# Patient Record
Sex: Female | Born: 1935 | Race: Black or African American | Hispanic: No | Smoking: Never smoker
Health system: Southern US, Community
[De-identification: ages and names within clinical notes are randomized; demographics above are authoritative.]

## PROBLEM LIST (undated history)

## (undated) DIAGNOSIS — F0390 Unspecified dementia without behavioral disturbance: Secondary | ICD-10-CM

## (undated) DIAGNOSIS — R634 Abnormal weight loss: Secondary | ICD-10-CM

## (undated) DIAGNOSIS — C541 Malignant neoplasm of endometrium: Secondary | ICD-10-CM

## (undated) DIAGNOSIS — R600 Localized edema: Secondary | ICD-10-CM

## (undated) DIAGNOSIS — N904 Leukoplakia of vulva: Secondary | ICD-10-CM

## (undated) DIAGNOSIS — M436 Torticollis: Secondary | ICD-10-CM

## (undated) DIAGNOSIS — N3091 Cystitis, unspecified with hematuria: Secondary | ICD-10-CM

## (undated) DIAGNOSIS — R5383 Other fatigue: Secondary | ICD-10-CM

## (undated) DIAGNOSIS — R51 Headache: Secondary | ICD-10-CM

## (undated) DIAGNOSIS — R519 Other chronic pain: Secondary | ICD-10-CM

## (undated) DIAGNOSIS — I214 Non-ST elevation (NSTEMI) myocardial infarction: Secondary | ICD-10-CM

## (undated) DIAGNOSIS — R319 Hematuria, unspecified: Secondary | ICD-10-CM

## (undated) DIAGNOSIS — N6320 Unspecified lump in the left breast, unspecified quadrant: Secondary | ICD-10-CM

## (undated) DIAGNOSIS — R5381 Other malaise: Secondary | ICD-10-CM

## (undated) DIAGNOSIS — B3731 Acute candidiasis of vulva and vagina: Secondary | ICD-10-CM

## (undated) DIAGNOSIS — B373 Candidiasis of vulva and vagina: Secondary | ICD-10-CM

## (undated) DIAGNOSIS — J45909 Unspecified asthma, uncomplicated: Secondary | ICD-10-CM

## (undated) DIAGNOSIS — G8929 Other chronic pain: Secondary | ICD-10-CM

## (undated) DIAGNOSIS — M549 Dorsalgia, unspecified: Secondary | ICD-10-CM

## (undated) DIAGNOSIS — N390 Urinary tract infection, site not specified: Secondary | ICD-10-CM

## (undated) DIAGNOSIS — I10 Essential (primary) hypertension: Secondary | ICD-10-CM

## (undated) DIAGNOSIS — G4730 Sleep apnea, unspecified: Secondary | ICD-10-CM

## (undated) DIAGNOSIS — I2510 Atherosclerotic heart disease of native coronary artery without angina pectoris: Secondary | ICD-10-CM

## (undated) DIAGNOSIS — T783XXA Angioneurotic edema, initial encounter: Secondary | ICD-10-CM

## (undated) HISTORY — DX: Other malaise: R53.81

## (undated) HISTORY — DX: Unspecified lump in the left breast, unspecified quadrant: N63.20

## (undated) HISTORY — DX: Sleep apnea, unspecified: G47.30

## (undated) HISTORY — DX: Cystitis, unspecified with hematuria: N30.91

## (undated) HISTORY — DX: Localized edema: R60.0

## (undated) HISTORY — DX: Leukoplakia of vulva: N90.4

## (undated) HISTORY — PX: CORONARY ANGIOPLASTY WITH STENT PLACEMENT: SHX49

## (undated) HISTORY — DX: Other chronic pain: G89.29

## (undated) HISTORY — DX: Candidiasis of vulva and vagina: B37.3

## (undated) HISTORY — DX: Angioneurotic edema, initial encounter: T78.3XXA

## (undated) HISTORY — DX: Acute candidiasis of vulva and vagina: B37.31

## (undated) HISTORY — DX: Headache, unspecified: R51.9

## (undated) HISTORY — DX: Urinary tract infection, site not specified: N39.0

## (undated) HISTORY — DX: Hematuria, unspecified: R31.9

## (undated) HISTORY — DX: Dorsalgia, unspecified: M54.9

## (undated) HISTORY — DX: Abnormal weight loss: R63.4

## (undated) HISTORY — PX: ABDOMINAL HYSTERECTOMY: SHX81

## (undated) HISTORY — DX: Malignant neoplasm of endometrium: C54.1

## (undated) HISTORY — PX: OTHER SURGICAL HISTORY: SHX169

## (undated) HISTORY — DX: Headache: R51

## (undated) HISTORY — DX: Torticollis: M43.6

## (undated) HISTORY — DX: Non-ST elevation (NSTEMI) myocardial infarction: I21.4

## (undated) HISTORY — DX: Atherosclerotic heart disease of native coronary artery without angina pectoris: I25.10

## (undated) HISTORY — DX: Other fatigue: R53.83

---

## 2004-09-10 ENCOUNTER — Emergency Department: Payer: Self-pay | Admitting: Internal Medicine

## 2004-09-10 ENCOUNTER — Other Ambulatory Visit: Payer: Self-pay

## 2004-11-09 ENCOUNTER — Emergency Department: Payer: Self-pay | Admitting: General Practice

## 2004-12-23 ENCOUNTER — Ambulatory Visit: Payer: Self-pay | Admitting: Family Medicine

## 2006-08-01 ENCOUNTER — Emergency Department (HOSPITAL_COMMUNITY): Admission: EM | Admit: 2006-08-01 | Discharge: 2006-08-02 | Payer: Self-pay | Admitting: Emergency Medicine

## 2009-03-27 ENCOUNTER — Ambulatory Visit: Payer: Self-pay | Admitting: Family Medicine

## 2009-04-29 ENCOUNTER — Emergency Department: Payer: Self-pay | Admitting: Emergency Medicine

## 2009-10-02 ENCOUNTER — Emergency Department: Payer: Self-pay | Admitting: Emergency Medicine

## 2009-12-04 ENCOUNTER — Emergency Department: Payer: Self-pay | Admitting: Emergency Medicine

## 2010-01-15 ENCOUNTER — Emergency Department: Payer: Self-pay | Admitting: Unknown Physician Specialty

## 2010-05-13 ENCOUNTER — Observation Stay: Payer: Self-pay | Admitting: Internal Medicine

## 2010-06-22 ENCOUNTER — Emergency Department: Payer: Self-pay | Admitting: Emergency Medicine

## 2010-11-15 ENCOUNTER — Emergency Department: Payer: Self-pay | Admitting: *Deleted

## 2011-01-13 ENCOUNTER — Emergency Department: Payer: Self-pay | Admitting: Internal Medicine

## 2011-02-02 ENCOUNTER — Emergency Department: Payer: Self-pay | Admitting: Emergency Medicine

## 2011-02-13 ENCOUNTER — Emergency Department: Payer: Self-pay | Admitting: *Deleted

## 2011-03-13 ENCOUNTER — Inpatient Hospital Stay: Payer: Self-pay | Admitting: Internal Medicine

## 2011-09-02 ENCOUNTER — Observation Stay: Payer: Self-pay | Admitting: Student

## 2011-09-02 LAB — CBC
HCT: 40.2 % (ref 35.0–47.0)
MCHC: 32.4 g/dL (ref 32.0–36.0)
MCV: 95 fL (ref 80–100)
Platelet: 166 10*3/uL (ref 150–440)
WBC: 4.9 10*3/uL (ref 3.6–11.0)

## 2011-09-02 LAB — CK TOTAL AND CKMB (NOT AT ARMC): CK, Total: 60 U/L (ref 21–215)

## 2011-09-02 LAB — COMPREHENSIVE METABOLIC PANEL
Albumin: 3.7 g/dL (ref 3.4–5.0)
BUN: 20 mg/dL — ABNORMAL HIGH (ref 7–18)
Bilirubin,Total: 0.4 mg/dL (ref 0.2–1.0)
Chloride: 105 mmol/L (ref 98–107)
Co2: 31 mmol/L (ref 21–32)
EGFR (African American): 57 — ABNORMAL LOW
Glucose: 101 mg/dL — ABNORMAL HIGH (ref 65–99)
SGOT(AST): 18 U/L (ref 15–37)
SGPT (ALT): 14 U/L
Sodium: 140 mmol/L (ref 136–145)
Total Protein: 8.2 g/dL (ref 6.4–8.2)

## 2011-09-02 LAB — TROPONIN I: Troponin-I: <0.02 ng/mL

## 2011-09-02 LAB — PRO B NATRIURETIC PEPTIDE: B-Type Natriuretic Peptide: 31 pg/mL (ref 0–450)

## 2011-09-03 LAB — LIPID PANEL
Cholesterol: 134 mg/dL (ref 0–200)
HDL Cholesterol: 49 mg/dL (ref 40–60)
Ldl Cholesterol, Calc: 73 mg/dL (ref 0–100)
VLDL Cholesterol, Calc: 12 mg/dL (ref 5–40)

## 2011-09-03 LAB — HEMOGLOBIN A1C: Hemoglobin A1C: 6.3 % (ref 4.2–6.3)

## 2011-09-14 ENCOUNTER — Emergency Department: Payer: Self-pay | Admitting: Unknown Physician Specialty

## 2011-09-14 LAB — BASIC METABOLIC PANEL
Anion Gap: 9 (ref 7–16)
BUN: 20 mg/dL — ABNORMAL HIGH (ref 7–18)
Co2: 23 mmol/L (ref 21–32)
Creatinine: 0.75 mg/dL (ref 0.60–1.30)
EGFR (Non-African Amer.): >60
Glucose: 78 mg/dL (ref 65–99)

## 2011-09-14 LAB — CBC
HGB: 11.5 g/dL — ABNORMAL LOW (ref 12.0–16.0)
MCV: 94 fL (ref 80–100)
Platelet: 166 10*3/uL (ref 150–440)
RDW: 14.4 % (ref 11.5–14.5)
WBC: 5 10*3/uL (ref 3.6–11.0)

## 2011-09-14 LAB — CK TOTAL AND CKMB (NOT AT ARMC): CK, Total: 48 U/L (ref 21–215)

## 2012-03-08 ENCOUNTER — Emergency Department: Payer: Self-pay | Admitting: Unknown Physician Specialty

## 2012-03-08 LAB — COMPREHENSIVE METABOLIC PANEL
Anion Gap: 5 — ABNORMAL LOW (ref 7–16)
BUN: 25 mg/dL — ABNORMAL HIGH (ref 7–18)
Bilirubin,Total: 0.4 mg/dL (ref 0.2–1.0)
Chloride: 106 mmol/L (ref 98–107)
Co2: 26 mmol/L (ref 21–32)
Creatinine: 0.87 mg/dL (ref 0.60–1.30)
EGFR (African American): >60
EGFR (Non-African Amer.): 59 — ABNORMAL LOW
Potassium: 3.7 mmol/L (ref 3.5–5.1)
SGOT(AST): 31 U/L (ref 15–37)
SGPT (ALT): 16 U/L (ref 12–78)
Total Protein: 7.8 g/dL (ref 6.4–8.2)

## 2012-03-08 LAB — CBC
HCT: 37.3 % (ref 35.0–47.0)
MCH: 31.1 pg (ref 26.0–34.0)
MCV: 93 fL (ref 80–100)
Platelet: 205 10*3/uL (ref 150–440)
RBC: 4.03 10*6/uL (ref 3.80–5.20)
RDW: 14.4 % (ref 11.5–14.5)
WBC: 6.4 10*3/uL (ref 3.6–11.0)

## 2012-03-08 LAB — TROPONIN I: Troponin-I: <0.02 ng/mL

## 2012-08-19 ENCOUNTER — Emergency Department: Payer: Self-pay | Admitting: Emergency Medicine

## 2012-08-19 LAB — CBC
HCT: 37.9 % (ref 35.0–47.0)
MCHC: 32.7 g/dL (ref 32.0–36.0)
MCV: 93 fL (ref 80–100)
Platelet: 180 10*3/uL (ref 150–440)
RBC: 4.07 10*6/uL (ref 3.80–5.20)
RDW: 14.5 % (ref 11.5–14.5)
WBC: 5 10*3/uL (ref 3.6–11.0)

## 2012-08-19 LAB — COMPREHENSIVE METABOLIC PANEL
Albumin: 3.4 g/dL (ref 3.4–5.0)
Alkaline Phosphatase: 41 U/L — ABNORMAL LOW (ref 50–136)
Creatinine: 0.74 mg/dL (ref 0.60–1.30)
EGFR (African American): >60
EGFR (Non-African Amer.): >60
Glucose: 87 mg/dL (ref 65–99)
Osmolality: 280 (ref 275–301)
SGOT(AST): 16 U/L (ref 15–37)
SGPT (ALT): 11 U/L — ABNORMAL LOW (ref 12–78)
Sodium: 141 mmol/L (ref 136–145)

## 2012-08-20 LAB — TROPONIN I: Troponin-I: <0.02 ng/mL

## 2012-08-20 LAB — PRO B NATRIURETIC PEPTIDE: B-Type Natriuretic Peptide: 43 pg/mL (ref 0–450)

## 2013-05-08 ENCOUNTER — Observation Stay: Payer: Self-pay | Admitting: Internal Medicine

## 2013-05-08 LAB — CBC
HCT: 37.9 % (ref 35.0–47.0)
HGB: 12.4 g/dL (ref 12.0–16.0)
MCH: 30.4 pg (ref 26.0–34.0)
MCHC: 32.7 g/dL (ref 32.0–36.0)
MCV: 93 fL (ref 80–100)
Platelet: 166 10*3/uL (ref 150–440)
RBC: 4.08 10*6/uL (ref 3.80–5.20)
RDW: 15.1 % — AB (ref 11.5–14.5)
WBC: 4.8 10*3/uL (ref 3.6–11.0)

## 2013-05-08 LAB — BASIC METABOLIC PANEL
Anion Gap: 3 — ABNORMAL LOW (ref 7–16)
BUN: 15 mg/dL (ref 7–18)
CALCIUM: 10.2 mg/dL — AB (ref 8.5–10.1)
CHLORIDE: 105 mmol/L (ref 98–107)
CREATININE: 0.95 mg/dL (ref 0.60–1.30)
Co2: 30 mmol/L (ref 21–32)
EGFR (Non-African Amer.): 53 — ABNORMAL LOW
Glucose: 102 mg/dL — ABNORMAL HIGH (ref 65–99)
OSMOLALITY: 277 (ref 275–301)
Potassium: 3.5 mmol/L (ref 3.5–5.1)
SODIUM: 138 mmol/L (ref 136–145)

## 2013-05-08 LAB — CK TOTAL AND CKMB (NOT AT ARMC)
CK, Total: 46 U/L (ref 21–215)
CK, Total: 45 U/L (ref 21–215)
CK, Total: 71 U/L (ref 21–215)
CK-MB: 0.7 ng/mL (ref 0.5–3.6)
CK-MB: 0.6 ng/mL (ref 0.5–3.6)
CK-MB: 1.2 ng/mL (ref 0.5–3.6)

## 2013-05-08 LAB — TROPONIN I
Troponin-I: <0.02 ng/mL
Troponin-I: <0.02 ng/mL

## 2013-05-08 LAB — PRO B NATRIURETIC PEPTIDE: B-Type Natriuretic Peptide: 57 pg/mL (ref 0–450)

## 2014-08-16 NOTE — Discharge Summary (Signed)
PATIENT NAME:  Sara Heath, Sara Heath MR#:  [id number] DATE OF BIRTH:  July 22, 1921  DATE OF ADMISSION:  05/08/2013 DATE OF DISCHARGE:  05/08/2013  ADMISSION DIAGNOSIS:  1.  Chest pain of unclear etiology.  2.  History of coronary artery disease.   CONSULTATIONS:  Dr. Ubaldo Glassing.   IMAGING: The patient had a 2-D echocardiogram which showed normal ejection fraction with mild aortic valve sclerosis, no stenosis.   LABORATORY DATA:  Troponins were negative.   HOSPITAL COURSE: This is a very pleasant 81 year old female who presented with chest pain. For further details, please refer to the H and P.  1.  Chest pain. The patient was admitted to the hospitalist service. She was admitted on telemetry. Cardiac enzymes were checked, both of which were negative. She underwent an echocardiogram, essentially this was normal with no mention of any wall motion abnormalities. We did not feel her chest pain is cardiac in nature. At this time, Dr. Ubaldo Glassing has seen the patient in consultation.  2.  History of CAD. The patient will continue her outpatient medications.  3.  History of gout, not on any medications.  4.  History of hyperlipidemia. We did start the patient on a statin as the patient does have a history of CAD.   5.  History of hypertension. The patient's blood pressures were elevated here so we did start her on lisinopril in addition to her Imdur.   DISCHARGE MEDICATIONS: 1.  Lasix 40 mg daily.  2.  Aspirin 81 mg daily.  3.  Atenolol 50 mg daily.  4.  Docusate 100 mg b.i.d. p.r.n.   5.  Imdur 60 mg in the morning.  5.  Cyclobenzaprine 5 mg every 8 hours as needed.  6.  Lisinopril 10 mg daily, which is a new medication due to her blood pressure.  7.  Simvastatin 10 mg daily.  8.  Nitroglycerin sublingual p.r.n. chest pain.   DISCHARGE DIET: Low sodium diet.   DISCHARGE ACTIVITY: As tolerated.   DISCHARGE FOLLOWUP: The patient will follow up with Dr. Ubaldo Glassing in 2 weeks as well as Dr. Shade Flood.  The patient is  medically stable for discharge.   TIME SPENT:  35 minutes. Plan of care was discussed with the family.   ____________________________ Donell Beers. Benjie Karvonen, MD spm:cs D: 05/08/2013 14:06:00 ET T: 05/08/2013 14:56:04 ET JOB#: [id number]  cc: Jaramiah Bossard P. Benjie Karvonen, MD, <Dictator> Sena Hitch, MD Javier Docker. Ubaldo Glassing, MD Donell Beers Yuliya Nova MD ELECTRONICALLY SIGNED 05/08/2013 21:27

## 2014-08-16 NOTE — Consult Note (Signed)
Present Illness The patient is an 81 year old African American female with a past medical history of coronary artery disease status post stent placement in the past, hyperlipidemia, gout, depression, hypertension who presented to the er per her daughter with 1-2 days history of chest pain and sob. Pt is a somwhat difficult historian but answers questions appropriately. She states she has no chest pain at present. She states she has seen a cardiologist in the past but does not know who it was or where the care was delivered. She has ruled out for an mi thus far. She deneis chest pain or shortness of breath. She states her ankles stay somewhat swollen much of the time.   Physical Exam:  GEN no acute distress   HEENT hearing intact to voice   NECK supple  No masses   RESP normal resp effort  rhonchi   CARD Regular rate and rhythm  Murmur   Murmur Systolic   Systolic Murmur Out flow   ABD denies tenderness  normal BS   EXTR positive edema, lower extremety edema   SKIN normal to palpation   NEURO cranial nerves intact, motor/sensory function intact   PSYCH poor insight   Review of Systems:  Subjective/Chief Complaint admitted with chest pain and sob. no symptoms at present   General: Fatigue  Weakness   Skin: No Complaints   ENT: No Complaints   Eyes: No Complaints   Neck: No Complaints   Respiratory: Short of breath   Cardiovascular: Chest pain or discomfort   Gastrointestinal: No Complaints   Genitourinary: No Complaints   Vascular: No Complaints   Musculoskeletal: No Complaints   Neurologic: No Complaints   Hematologic: No Complaints   Endocrine: No Complaints   Psychiatric: No Complaints   Review of Systems: All other systems were reviewed and found to be negative   ROS responds approriately but somewhat difficult historian   Medications/Allergies Reviewed Medications/Allergies reviewed   Home Medications: Medication Instructions Status   furosemide 40 mg oral tablet 1 tab(s) orally once a day Active  aspirin 81 mg oral delayed release tablet 1 tab(s) orally once a day Active  atenolol 50 mg oral tablet 1 tab(s) orally once a day for high blood pressure Active  docusate sodium 100 mg oral capsule 1 cap(s) orally 2 times a day, As Needed Active  isosorbide mononitrate 60 mg oral tablet, extended release 1 tab(s) orally once a day (in the morning) Active  cyclobenzaprine 5 mg oral tablet 1 tab(s) orally every 8 hours, As Needed and 1 tab at bedtime  Active   EKG:  EKG NSR   Abnormal NSSTTW changes   Interpretation 1st degree av block. no injury of ischemia   EKG Comparision Not changed from  previous ekg    Penicillin: Unknown  Cipro: Unknown   Impression 81 yo female with apparent history of cad s/p pci, history of hypertension who was admitted with complaints of chest pain. Has ruled out for mi thus far and ekg is unremarkable for ischemia or injury. CXR revealed stable cardiomegaly with no airspace disease or ppulmonary edema. Etiology of chest pain is unclear. May have been transient ischemia vs non ischemic pain. Will evaluate lv function and wall motion with echo. Continue with current meds including atorvastatin, metoprolol, asa and topical nitrates. Will defer further invasive or noninvasive evaluation pending echo, symptoms and further clinical data. Will attempt to treat medically given comorbid conditions.   Plan 1. Continue with asa, topical nitrates, atorvastatin, and metoprolol  2. DVT prophylaxis enoxaparin 3. Echo to evaluate lv function and valves 4. Continiue to rule out for mi   Electronic Signatures: Teodoro Spray (MD)  (Signed 14-Jan-15 09:50)  Authored: General Aspect/Present Illness, History and Physical Exam, Review of System, Home Medications, EKG , Allergies, Impression/Plan   Last Updated: 14-Jan-15 09:50 by Teodoro Spray (MD)

## 2014-08-16 NOTE — H&P (Signed)
PATIENT NAME:  Sara Heath, Sara Heath MR#:  [id number] DATE OF BIRTH:  1921-06-07  DATE OF ADMISSION:  05/08/2013  PRIMARY CARE PHYSICIAN: Dr. Sena Hitch.   REFERRING PHYSICIAN: Dr. Owens Shark.   CHIEF COMPLAINT: Chest pain.   HISTORY OF PRESENT ILLNESS: The patient is an 81 year old African American female with a past medical history of coronary artery disease status post stent placement in the past, hyperlipidemia, gout, depression, hypertension and depression, is presenting to the ER with a chief complaint of chest pain. The patient reported that yesterday evening she started having chest tightness in the middle of the chest with no radiation. Associated with shortness of breath, but denies any dizziness, nausea or vomiting. The chest tightness is progressively getting worse. Her daughter brought her into the ER. The patient was chest pain-free by the time she came into the ER. She has history of coronary artery disease and stents were placed in the past. Initial set of cardiac enzymes are negative; however, because of her significant risk factors and considering her age the hospitalist team is called to admit the patient to rule out acute myocardial infarction. During my examination, no family members are at bedside and the patient is chest pain-free. She is hard of hearing, but answering questions appropriately.   PAST MEDICAL HISTORY: Coronary artery disease status post stent, hyperlipidemia, GERD, depression, hypertension.   PAST SURGICAL HISTORY: Status post stenting of coronary artery disease.  ALLERGIES:  PENICILLIN.   PSYCHOSOCIAL HISTORY: Lives at home, lives with her daughter. No history of smoking, alcohol or illicit drug usage.   FAMILY HISTORY: Positive for hypertension.   HOME MEDICATIONS: Isosorbide mononitrate, dose unknown p.o. once daily, furosemide 40 mg once a day, Colace 100 mg p.o. q.12 hours, atenolol 50 mg p.o. once a day, aspirin 81 mg once daily.    REVIEW OF  SYSTEMS: CONSTITUTIONAL: Denies fever, fatigue.  EYES: Denies blurry vision, double vision.  ENT: Denies epistaxis, discharge.  RESPIRATION: Denies cough, COPD.  CARDIOVASCULAR: Complaining of chest pain, which has resolved by the time the patient came into the ER. Denies syncope.  GASTROINTESTINAL: No nausea, vomiting, diarrhea.  GENITOURINARY: No dysuria or hematuria. GYNECOLOGICAL AND BREAST:  Denies breast mass or vaginal discharge.  ENDOCRINE: Denies polyuria or nocturia.  HEMATOLOGIC AND LYMPHATIC: No anemia, easy bruising, bleeding.  INTEGUMENTARY: No acne, rash, lesions.  MUSCULOSKELETAL: No joint pain in neck and back. Has history of gout.  NEUROLOGICAL: No vertigo or ataxia.  PSYCHIATRIC: No ADD, OCD.   PHYSICAL EXAMINATION:  VITAL SIGNS: Temperature 98.2, pulse 51, respirations 18, blood pressure 115/66, pulse oximetry 99% on 2 liters.  GENERAL APPEARANCE: Not in acute distress. Moderately built and thin-looking African American female in no acute distress.  HEENT: Normocephalic, atraumatic. Pupils are equal, reacting to light and accommodation. No scleral icterus. No conjunctival injection. Moist mucous membranes.  NECK: Supple. No JVD. No thyromegaly.  LUNGS: Clear to auscultation bilaterally. No accessory muscle use and no anterior chest wall tenderness on palpation. No peripheral edema.  CARDIAC: S1, S2 normal. Regular rate and rhythm. No murmurs.  GASTROINTESTINAL: Soft. Bowel sounds are positive in all four quadrants. Nontender, nondistended. No hepatosplenomegaly. No masses felt.  NEUROLOGIC: Awake, alert, oriented x 3. Hard of hearing, but answering questions appropriately. Motor and sensory are grossly intact. Reflexes are 2+.  EXTREMITIES: No edema. No cyanosis. No clubbing.  SKIN: Warm to touch. Normal turgor. No rashes. No lesions.   MUSCULOSKELETAL: No joint effusion, tenderness or erythema.   LABS AND IMAGING STUDIES:  Troponin less than 0.02. CBC is normal.  Chem-8:  Glucose is 102. Anion gap 3. The rest of the labs are normal.  A 12-lead EKG: Sinus bradycardia with first-degree AV block, prolonged PR interval at 280 ms, normal QRS. No acute ST-T wave changes. Chest x-ray, portable: stable cardiomegaly and left lung base atelectasis.   ASSESSMENT AND PLAN: An 81 year old African American female presenting to the ER with a chief complaint of chest tightness associated with shortness of breath started yesterday, resolved done by the time the patient arrived to the ER will be admitted with following assessment and plan.  1. Chest pain rule out acute myocardial infarction. Admit her to telemetry bed. Acute coronary syndrome protocol with oxygen, nitroglycerin, aspirin, beta blocker and statin. Cycle cardiac biomarkers. Cardiology consult is placed to Dr. Ubaldo Glassing.  2. Hypertension. Resume her home medication and uptitrate as needed basis.  3. History of coronary artery disease status post stent.  4. Hyperlipidemia. The patient will be on statin.  5. Gout. No exacerbation.   The patient will be receiving GI and DVT prophylaxis.   The patient will be considered FULL CODE until CODE STATUS is determined by discussion with daughter. The patient could not determine her CODE STATUS at time. Diagnosis and plan of care was discussed in detail with the patient. She is aware of the plan.   Total time spent on admission is 45 minutes.   ____________________________ Nicholes Mango, MD ag:sg D: 05/08/2013 07:51:42 ET T: 05/08/2013 08:03:37 ET JOB#: [id number]  cc: Nicholes Mango, MD, <Dictator> Sena Hitch, MD  Nicholes Mango MD ELECTRONICALLY SIGNED 05/19/2013 7:12

## 2014-08-17 NOTE — Discharge Summary (Signed)
PATIENT NAME:  Sara Heath, Sara Heath MR#:  [id number] DATE OF BIRTH:  [birth year]  DATE OF ADMISSION:  09/02/2011 DATE OF DISCHARGE:  09/03/2011  CHIEF COMPLAINT: Chest pain.   DISCHARGE DIAGNOSES:  1. Chest pain likely not cardiac.  2. Hypertension.  3. History of coronary artery disease.   SECONDARY DIAGNOSES:  1. Status post myocardial infarction and stenting, in the past.  2. Gout.  3. Hyperlipidemia.  4. Depression.   DISCHARGE MEDICATIONS:  Imdur 30 mg p.o. daily. Aspirin 81 mg daily. Atenolol 50 mg daily. Simvastatin 20 mg daily. Loratadine 10 mg daily. Indomethacin 50 mg 3 times a day. Tramadol 50 mg 4 times a day as needed. Lasix 20 mg daily.   DIET: Mechanical soft.   ACTIVITY: As tolerated.   FOLLOWUP: Please follow up with her primary care physician for further work-up in 1 to 2 days and if she has further chest pains, please call your doctor right away.  HISTORY OF PRESENT ILLNESS: For full details, please see the history and physical dictated on 09/02/2011, but briefly this is an 81 year old female with history of coronary artery disease, status post myocardial infarction in the past, hyperlipidemia, hypertension who was referred here from [facility] after complaining of chest pain. The chest pain has lasted about 2 to 3 minutes and by the time she had gone to her doctor's office,  the EKG machine is broken and she was referred here. The chest pain occurred early in the morning and was  relieved with 2 nitroglycerin sublinguals. She was admitted to hospitalist service for observation and ruling out MI.   SIGNIFICANT LABS/IMAGING: BUN on arrival was 20, creatinine 1.02, BNP 31, glucose 101, LDL 73, triglycerides 59, HDL 49. LFTs: Alkaline phosphatase 39, otherwise within normal limits. Troponins negative x3. CK-MB negative x3. WBC on arrival 4.9, hemoglobin 13, hematocrit 40.2. X-ray of the chest:  Enlarged tortuous thoracic aorta similar to prior. No acute cardiopulmonary  disease. Stress test, nuclear medicine. Although this is not officially read at a computer per Dr. Nehemiah Massed who read the stress test, it was  negative for ischemia and the patient is dischargeable.   HOSPITAL COURSE: The patient was admitted to the hospitalist service and cyclic cardiac markers were obtained. She had no acute changes on the EKG and negative troponin x3. She had no further episodes of chest pain. Initially she was started on nitro paste and this was changed to Imdur. She had no further chest pains while here. Her LDL came back well controlled for her and she had stable blood pressure here. At this point, as she has had no further symptoms she would be discharged with outpatient follow-up. In addition she was prescribed Imdur. She is to continue taking her beta blocker, Lasix and aspirin.   Total Time Spent: 35 minutes.   CODE STATUS: THE PATIENT IS FULL CODE    ____________________________ Vivien Presto, MD sa:ljs D: 09/03/2011 16:19:35 ET T: 09/06/2011 10:38:20 ET JOB#: [id number]  cc: Vivien Presto, MD, <Dictator> Vivien Presto MD ELECTRONICALLY SIGNED 09/09/2011 16:30

## 2014-08-17 NOTE — H&P (Signed)
PATIENT NAME:  Sara Heath, SHAMOON MR#:  [id number] DATE OF BIRTH:  [birth year]  DATE OF ADMISSION:  09/02/2011  REFERRING PHYSICIAN:  Dr Ulice Brilliant.   PRIMARY CARE PHYSICIAN:  At [facility].    CHIEF COMPLAINT: Chest pain.   HISTORY OF PRESENT ILLNESS: The patient is an 81 year old African American female with history of hypertension, coronary artery disease, status post stenting and myocardial infarction, hyperlipidemia, gout who presents with above complaint. The patient is accompanied by her daughter, who provides history as well. The patient stated that chest pain episode woke her up from sleep, lasted about 2 to 3 minutes with shortness of breath and radiation in the right arm. She took a nitroglycerin and then another with improvement. She went to her doctor's office and as they could not do an EKG I referred her here. She has no further chest pains. There are no other associated symptoms. Per daughter she has been having several doses of nitroglycerin more recently. Hospitalist service was contacted for further evaluation and management. The first troponin is negative. Furthermore, the patient has had a negative stress test in 04/2010 showing no evidence of myocardial ischemia.   PAST MEDICAL HISTORY:  1. Hypertension.  2. Coronary artery disease, status post myocardial infarction in 2002, status post stenting.  3. Hyperlipidemia.  4. Gout.  5. Depression.   ALLERGIES: Penicillin and Cipro.      MEDICATIONS:  1. Lasix 40 mg daily.  2. Atenolol 50 mg daily.  3. Simvastatin 20 mg daily.  4. Nitroglycerin sublingual p.r.n.  5. Zyrtec 10 mg p.r.n.  6. Docusate sodium p.r.n.  7. Colchicine 0.6 mg take 2 tabs p.r.n. for gout.  8. Aspirin 81 mg daily.   FAMILY HISTORY: Daughter with hypertension, diabetes. Sister with breast cancer.   SOCIAL HISTORY: No tobacco, alcohol or drug use. Lives with her daughter.    REVIEW OF SYSTEMS: CONSTITUTIONAL: Denies fever or fatigue, weakness, or  weight changes. EYES: No blurry vision or double vision. ENT: No tinnitus or hearing loss. RESPIRATORY: No shortness of breath currently, however did have episode of shortness of breath associated with the chest pain. Has a chronic cough. CARDIOVASCULAR: Chest pain as above. No orthopnea. Has occasional lower extremity edema. Has chronic dyspnea on exertion.  GASTROINTESTINAL: No nausea, vomiting, diarrhea, abdominal pain, hematemesis. GENITOURINARY: Denies dysuria, hematuria, or incontinence. ENDOCRINE: Denies polyuria, nocturia, or thyroid problems. HEME/LYMPH: Denies anemia or easy bruising. SKIN: Denies any new rashes. MUSCULOSKELETAL: Has chronic arthritis.  NEUROLOGIC: Denies numbness, weakness, a transient ischemic attack or CVA. PSYCHIATRIC: Denies anxiety or insomnia. Has depression.   PHYSICAL EXAMINATION:  VITAL SIGNS: Temperature on arrival 96.3, pulse rate 64, respiratory rate 13, blood pressure 135/79, O2 saturations 100% on room air.   GENERAL: The patient is an elderly African American female lying in bed, no obvious distress.   HEENT: Normocephalic, atraumatic. Pupils are equal and reactive. Anicteric sclerae. Has no teeth and no dentures.   NECK: Supple. No thyroid tenderness.   CARDIOVASCULAR: S1, S2 regular rate and rhythm. No murmurs, rubs, or gallops.   LUNGS: Clear to auscultation without wheezing or rhonchi.   ABDOMEN: Soft, nontender, no organomegaly noted.   EXTREMITIES: No significant lower extremity edema.   NEUROLOGICAL: Cranial nerves II through XII grossly intact. Strength is 5 out of 5 all extremities.   LABORATORIES: Glucose 101, BUN 20, creatinine is 1.02, sodium 140, potassium 3.7. LFTs: Alkaline phosphatase is 39, otherwise within normal limits. Troponin negative. CK-MB 1. WBC 4.9, hemoglobin 13, hematocrit  40.2, platelets 166,000. EKG shows normal sinus rhythm, minimum voltage for left ventricular hypertrophy. There are Q waves in V1, V2, V3 as well as lead  III, does not appear to be any acute ST elevations or depressions. Portable chest, pain and large tortuous thoracic aorta similar to prior, otherwise no acute cardiopulmonary disease.   ASSESSMENT AND PLAN: We have an 81 year old African American female with history of cerebrovascular accident status post stenting, hypertension, depression, myocardial infarction who presents with chest pain resolved with nitroglycerin. The patient has also had several other episodes of chest pain which improved with nitroglycerin recently. Although the patient has a negative troponin and no acute EKG changes currently, we would admit the patient for observation and rule out acute coronary syndrome. I would check cyclic cardiac markers and if negative we will order an echocardiogram for tomorrow. I would continue the aspirin, beta-blocker, and statin. I will check a lipid profile as well. I would apply nitroglycerin paste as well. She has no chest pains currently. The patient's last stress test was in 2010 which was negative. Would also place her on remote telemetry.   In regards to the hypertension, would continue outpatient medicines. I would resume her statin as well. We will start the patient on heparin for deep vein thrombosis prophylaxis.   TOTAL TIME SPENT: 35 minutes.   CODE STATUS: THE PATIENT IS FULL CODE CURRENTLY.    ____________________________ Vivien Presto, MD sa:vtd D: 09/02/2011 17:54:00 ET T: 09/03/2011 07:16:22 ET JOB#: [id number]  cc: Trish Fountain [facility] Vivien Presto, MD, <Dictator> [facility] MD ELECTRONICALLY SIGNED 09/09/2011 16:30

## 2014-10-02 ENCOUNTER — Emergency Department
Admission: EM | Admit: 2014-10-02 | Discharge: 2014-10-02 | Disposition: A | Discharge status: Home / Self Care | Payer: Medicare Other | Attending: Emergency Medicine | Admitting: Emergency Medicine

## 2014-10-02 ENCOUNTER — Emergency Department: Payer: Medicare Other

## 2014-10-02 ENCOUNTER — Encounter: Payer: Self-pay | Admitting: *Deleted

## 2014-10-02 DIAGNOSIS — Y9389 Activity, other specified: Secondary | ICD-10-CM | POA: Insufficient documentation

## 2014-10-02 DIAGNOSIS — S0081XA Abrasion of other part of head, initial encounter: Secondary | ICD-10-CM | POA: Insufficient documentation

## 2014-10-02 DIAGNOSIS — Y92096 Garden or yard of other non-institutional residence as the place of occurrence of the external cause: Secondary | ICD-10-CM | POA: Insufficient documentation

## 2014-10-02 DIAGNOSIS — S0990XA Unspecified injury of head, initial encounter: Secondary | ICD-10-CM | POA: Diagnosis present

## 2014-10-02 DIAGNOSIS — I10 Essential (primary) hypertension: Secondary | ICD-10-CM | POA: Diagnosis not present

## 2014-10-02 DIAGNOSIS — N390 Urinary tract infection, site not specified: Secondary | ICD-10-CM | POA: Diagnosis not present

## 2014-10-02 DIAGNOSIS — R600 Localized edema: Secondary | ICD-10-CM | POA: Insufficient documentation

## 2014-10-02 DIAGNOSIS — S0093XA Contusion of unspecified part of head, initial encounter: Secondary | ICD-10-CM

## 2014-10-02 DIAGNOSIS — Y998 Other external cause status: Secondary | ICD-10-CM | POA: Insufficient documentation

## 2014-10-02 DIAGNOSIS — Z880 Allergy status to penicillin: Secondary | ICD-10-CM | POA: Diagnosis not present

## 2014-10-02 DIAGNOSIS — F0390 Unspecified dementia without behavioral disturbance: Secondary | ICD-10-CM | POA: Insufficient documentation

## 2014-10-02 DIAGNOSIS — S0083XA Contusion of other part of head, initial encounter: Secondary | ICD-10-CM | POA: Diagnosis not present

## 2014-10-02 DIAGNOSIS — W19XXXA Unspecified fall, initial encounter: Secondary | ICD-10-CM

## 2014-10-02 DIAGNOSIS — W1839XA Other fall on same level, initial encounter: Secondary | ICD-10-CM | POA: Diagnosis not present

## 2014-10-02 HISTORY — DX: Unspecified dementia, unspecified severity, without behavioral disturbance, psychotic disturbance, mood disturbance, and anxiety: F03.90

## 2014-10-02 HISTORY — DX: Essential (primary) hypertension: I10

## 2014-10-02 LAB — CBC WITH DIFFERENTIAL/PLATELET
BASOS ABS: 0 10*3/uL (ref 0–0.1)
Basophils Relative: 0 %
Eosinophils Absolute: 0.2 10*3/uL (ref 0–0.7)
Eosinophils Relative: 5 %
HCT: 37.5 % (ref 35.0–47.0)
HEMOGLOBIN: 12 g/dL (ref 12.0–16.0)
Lymphocytes Relative: 23 %
Lymphs Abs: 1.1 10*3/uL (ref 1.0–3.6)
MCH: 30.3 pg (ref 26.0–34.0)
MCHC: 32.1 g/dL (ref 32.0–36.0)
MCV: 94.4 fL (ref 80.0–100.0)
Monocytes Absolute: 0.5 10*3/uL (ref 0.2–0.9)
Monocytes Relative: 10 %
NEUTROS PCT: 62 %
Neutro Abs: 3 10*3/uL (ref 1.4–6.5)
PLATELETS: 155 10*3/uL (ref 150–440)
RBC: 3.97 MIL/uL (ref 3.80–5.20)
RDW: 15.2 % — ABNORMAL HIGH (ref 11.5–14.5)
WBC: 4.7 10*3/uL (ref 3.6–11.0)

## 2014-10-02 LAB — URINALYSIS COMPLETE WITH MICROSCOPIC (ARMC ONLY)
BILIRUBIN URINE: NEGATIVE
GLUCOSE, UA: NEGATIVE mg/dL
Ketones, ur: NEGATIVE mg/dL
Nitrite: POSITIVE — AB
PH: 5 (ref 5.0–8.0)
PROTEIN: 30 mg/dL — AB
Specific Gravity, Urine: 1.017 (ref 1.005–1.030)

## 2014-10-02 LAB — BASIC METABOLIC PANEL
Anion gap: 4 — ABNORMAL LOW (ref 5–15)
BUN: 19 mg/dL (ref 6–20)
CHLORIDE: 112 mmol/L — AB (ref 101–111)
CO2: 24 mmol/L (ref 22–32)
Calcium: 10 mg/dL (ref 8.9–10.3)
Creatinine, Ser: 0.94 mg/dL (ref 0.44–1.00)
GFR calc non Af Amer: 52 mL/min — ABNORMAL LOW (ref >60)
GLUCOSE: 89 mg/dL (ref 65–99)
POTASSIUM: 3.9 mmol/L (ref 3.5–5.1)
SODIUM: 140 mmol/L (ref 135–145)

## 2014-10-02 MED ORDER — SULFAMETHOXAZOLE-TRIMETHOPRIM 800-160 MG PO TABS
2.0000 | ORAL_TABLET | Freq: Once | ORAL | Status: AC
  Administered 2014-10-02: 2 via ORAL

## 2014-10-02 MED ORDER — SULFAMETHOXAZOLE-TRIMETHOPRIM 400-80 MG PO TABS: 1.0000 | ORAL_TABLET | Freq: Two times a day (BID) | ORAL | Status: DC

## 2014-10-02 MED ORDER — SULFAMETHOXAZOLE-TRIMETHOPRIM 800-160 MG PO TABS
ORAL_TABLET | ORAL | Status: AC
  Administered 2014-10-02: 2 via ORAL
  Filled 2014-10-02: qty 2
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
## 2014-10-02 NOTE — Discharge Instructions (Signed)
Urinary Tract Infection Urinary tract infections (UTIs) can develop anywhere along your urinary tract. Your urinary tract is your body's drainage system for removing wastes and extra water. Your urinary tract includes two kidneys, two ureters, a bladder, and a urethra. Your kidneys are a pair of bean-shaped organs. Each kidney is about the size of your fist. They are located below your ribs, one on each side of your spine. CAUSES Infections are caused by microbes, which are microscopic organisms, including fungi, viruses, and bacteria. These organisms are so small that they can only be seen through a microscope. Bacteria are the microbes that most commonly cause UTIs. SYMPTOMS  Symptoms of UTIs may vary by age and gender of the patient and by the location of the infection. Symptoms in young women typically include a frequent and intense urge to urinate and a painful, burning feeling in the bladder or urethra during urination. Older women and men are more likely to be tired, shaky, and weak and have muscle aches and abdominal pain. A fever may mean the infection is in your kidneys. Other symptoms of a kidney infection include pain in your back or sides below the ribs, nausea, and vomiting. DIAGNOSIS To diagnose a UTI, your caregiver will ask you about your symptoms. Your caregiver also will ask to provide a urine sample. The urine sample will be tested for bacteria and white blood cells. White blood cells are made by your body to help fight infection. TREATMENT  Typically, UTIs can be treated with medication. Because most UTIs are caused by a bacterial infection, they usually can be treated with the use of antibiotics. The choice of antibiotic and length of treatment depend on your symptoms and the type of bacteria causing your infection. HOME CARE INSTRUCTIONS  If you were prescribed antibiotics, take them exactly as your caregiver instructs you. Finish the medication even if you feel better after you  have only taken some of the medication.  Drink enough water and fluids to keep your urine clear or pale yellow.  Avoid caffeine, tea, and carbonated beverages. They tend to irritate your bladder.  Empty your bladder often. Avoid holding urine for long periods of time.  Empty your bladder before and after sexual intercourse.  After a bowel movement, women should cleanse from front to back. Use each tissue only once. SEEK MEDICAL CARE IF:   You have back pain.  You develop a fever.  Your symptoms do not begin to resolve within 3 days. SEEK IMMEDIATE MEDICAL CARE IF:   You have severe back pain or lower abdominal pain.  You develop chills.  You have nausea or vomiting.  You have continued burning or discomfort with urination. MAKE SURE YOU:   Understand these instructions.  Will watch your condition.  Will get help right away if you are not doing well or get worse. Document Released: 01/19/2005 Document Revised: 10/11/2011 Document Reviewed: 05/20/2011 [facility] Patient Information 2015 [place], [place]. This information is not intended to replace advice given to you by your health care provider. Make sure you discuss any questions you have with your health care provider.  Head Injury You have a head injury. Headaches and throwing up (vomiting) are common after a head injury. It should be easy to wake up from sleeping. Sometimes you must stay in the hospital. Most problems happen within the first 24 hours. Side effects may occur up to 7-10 days after the injury.  WHAT ARE THE TYPES OF HEAD INJURIES? Head injuries can be as  minor as a bump. Some head injuries can be more severe. More severe head injuries include:  A jarring injury to the brain (concussion).  A bruise of the brain (contusion). This mean there is bleeding in the brain that can cause swelling.  A cracked skull (skull fracture).  Bleeding in the brain that collects, clots, and forms a bump (hematoma). WHEN  SHOULD I GET HELP RIGHT AWAY?   You are confused or sleepy.  You cannot be woken up.  You feel sick to your stomach (nauseous) or keep throwing up (vomiting).  Your dizziness or unsteadiness is getting worse.  You have very bad, lasting headaches that are not helped by medicine. Take medicines only as told by your doctor.  You cannot use your arms or legs like normal.  You cannot walk.  You notice changes in the black spots in the center of the colored part of your eye (pupil).  You have clear or bloody fluid coming from your nose or ears.  You have trouble seeing. During the next 24 hours after the injury, you must stay with someone who can watch you. This person should get help right away (call 911 in the [place] if you start to shake and are not able to control it (have seizures), you pass out, or you are unable to wake up. HOW CAN I PREVENT A HEAD INJURY IN THE FUTURE?  Wear seat belts.  Wear a helmet while bike riding and playing sports like football.  Stay away from dangerous activities around the house. WHEN CAN I RETURN TO NORMAL ACTIVITIES AND ATHLETICS? See your doctor before doing these activities. You should not do normal activities or play contact sports until 1 week after the following symptoms have stopped:  Headache that does not go away.  Dizziness.  Poor attention.  Confusion.  Memory problems.  Sickness to your stomach or throwing up.  Tiredness.  Fussiness.  Bothered by bright lights or loud noises.  Anxiousness or depression.  Restless sleep. MAKE SURE YOU:   Understand these instructions.  Will watch your condition.  Will get help right away if you are not doing well or get worse. Document Released: 03/24/2008 Document Revised: 08/26/2013 Document Reviewed: 12/17/2012 [facility] Patient Information 2015 [place], [place]. This information is not intended to replace advice given to you by your health care provider. Make sure you discuss any  questions you have with your health care provider.
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
## 2014-10-04 LAB — URINE CULTURE: Culture: >=100000

## 2014-10-11 ENCOUNTER — Encounter: Payer: Self-pay | Admitting: Emergency Medicine

## 2014-10-11 ENCOUNTER — Emergency Department
Admission: EM | Admit: 2014-10-11 | Discharge: 2014-10-11 | Disposition: A | Discharge status: Home / Self Care | Payer: Medicare Other | Attending: Emergency Medicine | Admitting: Emergency Medicine

## 2014-10-11 DIAGNOSIS — N3090 Cystitis, unspecified without hematuria: Secondary | ICD-10-CM | POA: Insufficient documentation

## 2014-10-11 DIAGNOSIS — Z792 Long term (current) use of antibiotics: Secondary | ICD-10-CM | POA: Insufficient documentation

## 2014-10-11 DIAGNOSIS — I10 Essential (primary) hypertension: Secondary | ICD-10-CM | POA: Insufficient documentation

## 2014-10-11 DIAGNOSIS — F0390 Unspecified dementia without behavioral disturbance: Secondary | ICD-10-CM | POA: Insufficient documentation

## 2014-10-11 DIAGNOSIS — R319 Hematuria, unspecified: Secondary | ICD-10-CM | POA: Diagnosis present

## 2014-10-11 DIAGNOSIS — Z880 Allergy status to penicillin: Secondary | ICD-10-CM | POA: Insufficient documentation

## 2014-10-11 LAB — URINALYSIS COMPLETE WITH MICROSCOPIC (ARMC ONLY)
BACTERIA UA: NONE SEEN
Bilirubin Urine: NEGATIVE
GLUCOSE, UA: NEGATIVE mg/dL
NITRITE: NEGATIVE
PROTEIN: NEGATIVE mg/dL
Specific Gravity, Urine: 1.015 (ref 1.005–1.030)
pH: 5 (ref 5.0–8.0)

## 2014-10-11 MED ORDER — CEPHALEXIN 500 MG PO CAPS: 500.0000 mg | ORAL_CAPSULE | Freq: Three times a day (TID) | ORAL | Status: DC

## 2014-10-11 NOTE — ED Notes (Signed)
Patient attempted to urinate, but was unable to get enough to collect a sample.

## 2014-10-11 NOTE — ED Provider Notes (Signed)
[facility] Emergency Department Provider Note  ____________________________________________  Time seen: 6:15 PM  I have reviewed the triage vital signs and the nursing notes.   HISTORY  Chief Complaint Hematuria    HPI Sara Heath is a 81 y.o. female is brought to the ED today by her daughter due to seeing bloody urine this morning. She denies any fever chills dizziness lightheadedness chest pain shortness of breath abdominal pain back pain. She denies dysuria frequency urgency. She was seen in the ED 9 days ago and incidentally found to have a urinary tract infection that was nitrite positive. She was started on Bactrim. Urine culture from that time shows that the infection is pansensitive and should be responsive to Bactrim. She has had good appetite with good oral intake of food and fluids.  Patient has been compliant with Bactrim course and is scheduled to take her last 2 doses tomorrow   Past Medical History  Diagnosis Date  . Hypertension   . Dementia     There are no active problems to display for this patient.   Past Surgical History  Procedure Laterality Date  . Abdominal hysterectomy      Current Outpatient Rx  Name  Route  Sig  Dispense  Refill  . cephALEXin (KEFLEX) 500 MG capsule   Oral   Take 1 capsule (500 mg total) by mouth 3 (three) times daily.   21 capsule   0   . sulfamethoxazole-trimethoprim (BACTRIM) 400-80 MG per tablet   Oral   Take 1 tablet by mouth 2 (two) times daily.   14 tablet   0     Allergies Penicillins  No family history on file.  Social History History  Substance Use Topics  . Smoking status: Never Smoker   . Smokeless tobacco: Never Used  . Alcohol Use: No    Review of Systems  Constitutional: No fever or chills. No weight changes Eyes:No blurry vision or double vision.  ENT: No sore throat. Cardiovascular: No chest pain. Respiratory: No dyspnea or cough. Gastrointestinal: Negative  for abdominal pain, vomiting and diarrhea.  No BRBPR or melena. Genitourinary: Positive hematuria today. Musculoskeletal: Negative for back pain. No joint swelling or pain. Skin: Negative for rash. Neurological: Negative for headaches, focal weakness or numbness. Psychiatric:No anxiety or depression.   Endocrine:No hot/cold intolerance, changes in energy, or sleep difficulty.  10-point ROS otherwise negative.  ____________________________________________   PHYSICAL EXAM:  VITAL SIGNS: ED Triage Vitals  Enc Vitals Group     BP 10/11/14 1516 123/95 mmHg     Pulse Rate 10/11/14 1516 82     Resp 10/11/14 1516 20     Temp 10/11/14 1516 98 F (36.7 C)     Temp Source 10/11/14 1516 Oral     SpO2 10/11/14 1516 99 %     Weight 10/11/14 1516 145 lb (65.772 kg)     Height 10/11/14 1516 5\' 4"  (1.626 m)     Head Cir --      Peak Flow --      Pain Score --      Pain Loc --      Pain Edu? --      Excl. in [place]? --      Constitutional: Alert , oriented to self. Well appearing and in no distress. Eyes: No scleral icterus. No conjunctival pallor. PERRL. EOMI ENT   Head: Normocephalic and atraumatic.   Nose: No congestion/rhinnorhea. No septal hematoma   Mouth/Throat: MMM, no pharyngeal  erythema. No peritonsillar mass. No uvula shift.   Neck: No stridor. No SubQ emphysema. No meningismus. Hematological/Lymphatic/Immunilogical: No cervical lymphadenopathy. Cardiovascular: RRR. Normal and symmetric distal pulses are present in all extremities. No murmurs, rubs, or gallops. Respiratory: Normal respiratory effort without tachypnea nor retractions. Breath sounds are clear and equal bilaterally. No wheezes/rales/rhonchi. Gastrointestinal: Mild suprapubic tenderness.. No distention. There is no CVA tenderness.  No rebound, rigidity, or guarding. Genitourinary: deferred Musculoskeletal: Nontender with normal range of motion in all extremities. No joint effusions.  No lower extremity  tenderness.  No edema. Neurologic:   Normal speech and language.  CN 2-10 normal. Motor grossly intact. No pronator drift.  Normal gait. No gross focal neurologic deficits are appreciated.  Skin:  Skin is warm, dry and intact. No rash noted.  No petechiae, purpura, or bullae. Psychiatric: Mood and affect are normal. Speech and behavior are normal. Patient exhibits appropriate insight and judgment.  ____________________________________________    LABS (pertinent positives/negatives) (all labs ordered are listed, but only abnormal results are displayed) Labs Reviewed  URINALYSIS COMPLETEWITH MICROSCOPIC ([place]) - Abnormal; Notable for the following:    Color, Urine YELLOW (*)    APPearance HAZY (*)    Ketones, ur TRACE (*)    Hgb urine dipstick 2+ (*)    Leukocytes, UA 3+ (*)    Squamous Epithelial / LPF 0-5 (*)    All other components within normal limits   urine white blood cells too numerous to count Urine red blood cells 6-30 ____________________________________________   EKG    ____________________________________________    RADIOLOGY    ____________________________________________   PROCEDURES  ____________________________________________   INITIAL IMPRESSION / ASSESSMENT AND PLAN / ED COURSE  Pertinent labs & imaging results that were available during my care of the patient were reviewed by me and considered in my medical decision making (see chart for details).  The patient presented with mild hematuria, which is confirmed with urine microscopy. She also has persistence of pyuria despite absence of symptoms, unremarkable vital signs, and being compliant with her Bactrim course. Is no evidence of pyelonephritis sepsis or urinary obstruction. She is well-appearing, no acute distress. We'll discharge her home and have her follow-up with primary care this week for continued monitoring of her symptoms. I'll start her on Keflex, as she has persistent large  white blood cells despite the nitrite normalizing, especially with suprapubic tenderness indicating cystitis.  ____________________________________________   FINAL CLINICAL IMPRESSION(S) / ED DIAGNOSES  Final diagnoses:  Cystitis      Carrie Mew, MD 10/11/14 1900

## 2014-10-11 NOTE — Discharge Instructions (Signed)

## 2014-10-11 NOTE — ED Notes (Signed)
Patient to ER for c/o hematuria. Patient's family member states she noticed blood in patient's brief yesterday. Was treated with Bactrim when seen for yeast infection last week here in ER.

## 2014-12-07 ENCOUNTER — Emergency Department
Admission: EM | Admit: 2014-12-07 | Discharge: 2014-12-07 | Disposition: A | Discharge status: Home / Self Care | Payer: Medicare Other | Attending: Emergency Medicine | Admitting: Emergency Medicine

## 2014-12-07 ENCOUNTER — Emergency Department: Payer: Medicare Other

## 2014-12-07 ENCOUNTER — Encounter: Payer: Self-pay | Admitting: Emergency Medicine

## 2014-12-07 DIAGNOSIS — Z880 Allergy status to penicillin: Secondary | ICD-10-CM | POA: Insufficient documentation

## 2014-12-07 DIAGNOSIS — I10 Essential (primary) hypertension: Secondary | ICD-10-CM | POA: Insufficient documentation

## 2014-12-07 DIAGNOSIS — N950 Postmenopausal bleeding: Secondary | ICD-10-CM | POA: Diagnosis not present

## 2014-12-07 DIAGNOSIS — F0390 Unspecified dementia without behavioral disturbance: Secondary | ICD-10-CM | POA: Insufficient documentation

## 2014-12-07 DIAGNOSIS — N939 Abnormal uterine and vaginal bleeding, unspecified: Secondary | ICD-10-CM | POA: Diagnosis present

## 2014-12-07 DIAGNOSIS — Z792 Long term (current) use of antibiotics: Secondary | ICD-10-CM | POA: Diagnosis not present

## 2014-12-07 LAB — CBC WITH DIFFERENTIAL/PLATELET
BASOS ABS: 0 10*3/uL (ref 0–0.1)
BASOS PCT: 1 %
EOS ABS: 0.2 10*3/uL (ref 0–0.7)
Eosinophils Relative: 5 %
HCT: 39.6 % (ref 35.0–47.0)
HEMOGLOBIN: 12.7 g/dL (ref 12.0–16.0)
LYMPHS PCT: 31 %
Lymphs Abs: 1.3 10*3/uL (ref 1.0–3.6)
MCH: 30.4 pg (ref 26.0–34.0)
MCHC: 32 g/dL (ref 32.0–36.0)
MCV: 94.9 fL (ref 80.0–100.0)
Monocytes Absolute: 0.4 10*3/uL (ref 0.2–0.9)
Monocytes Relative: 9 %
Neutro Abs: 2.3 10*3/uL (ref 1.4–6.5)
Neutrophils Relative %: 54 %
Platelets: 160 10*3/uL (ref 150–440)
RBC: 4.17 MIL/uL (ref 3.80–5.20)
RDW: 15.3 % — ABNORMAL HIGH (ref 11.5–14.5)
WBC: 4.2 10*3/uL (ref 3.6–11.0)

## 2014-12-07 NOTE — ED Notes (Signed)
Pt presents to ED with daughter for vaginal bleeding. Pt has some confusion is not clear on why she is here. Pt lives with daughter. Pt uses cane to ambulate.

## 2014-12-07 NOTE — ED Notes (Signed)
Pelvic exam performed by Dr. Karma Greaser with this writer as assist. Patient tolerated procedure well. Family at bedside.

## 2014-12-07 NOTE — ED Notes (Signed)
Report given Sonjia.

## 2014-12-07 NOTE — Discharge Instructions (Signed)
As we discussed, we did not identify a specific cause of Ms. Facey's vaginal bleeding, but is very important that you follow-up as an outpatient for further evaluation.  Please call the clinic listed to be seen by Dr. Ouida Sills or one of his colleagues sometime this coming week.  Return to the emergency department with new or worsening symptoms that concern you.   Postmenopausal Bleeding Postmenopausal bleeding is any bleeding after menopause. Menopause is when a woman's period stops. Any type of bleeding after menopause is concerning. It should be checked by your doctor. Any treatment will depend on the cause. HOME CARE Watch your condition for any changes.  Avoid the use of tampons and douches as told by your doctor.  Change your pads often.  Get regular pelvic exams and Pap tests.  Keep all appointments for tests as told by your doctor. GET HELP IF:  Your bleeding lasts for more than 1 week.  You have belly (abdominal) pain.  You have bleeding after sex (intercourse). GET HELP RIGHT AWAY IF:   You have a fever, chills, a headache, dizziness, muscle aches, and bleeding.  You have strong pain with bleeding.  You have clumps of blood (blood clots) coming from your vagina.  You have bleeding and need more than 1 pad an hour.  You feel like you are going to pass out (faint). MAKE SURE YOU:  Understand these instructions.  Will watch your condition.  Will get help right away if you are not doing well or get worse. Document Released: 01/19/2008 Document Revised: 04/16/2013 Document Reviewed: 11/08/2012 [facility] Patient Information 2015 [place], [place]. This information is not intended to replace advice given to you by your health care provider. Make sure you discuss any questions you have with your health care provider.

## 2014-12-07 NOTE — ED Provider Notes (Signed)
[facility] Emergency Department Provider Note  ____________________________________________  Time seen: Approximately 10:31 AM  I have reviewed the triage vital signs and the nursing notes.   HISTORY  Chief Complaint Vaginal Bleeding  History limited by chronic dementia  HPI Sara Heath is a 81 y.o. female with a history of hypertension, dementia, and tubal ligation presents with new onset vaginal bleeding.  Her daughter reports that she was checking the patient helping her change this morning and discovered some blood in her underpants which she is not sure whether it was vaginal or rectal.  The patient has had no complaints and denies any pain or discomfort at this time.  The daughter is unaware if the patient has had any GYN treatment or evaluations and many years and does not think so.  The amount of blood seen was very small and sometimes she does have some blood when she wipes.  She has had no nausea, vomiting, chest pain, shortness of breath, nor abdominal pain.She is at her baseline mental status.   Past Medical History  Diagnosis Date  . Hypertension   . Dementia     There are no active problems to display for this patient.   Past Surgical History  Procedure Laterality Date  . Abdominal hysterectomy      Current Outpatient Rx  Name  Route  Sig  Dispense  Refill  . cephALEXin (KEFLEX) 500 MG capsule   Oral   Take 1 capsule (500 mg total) by mouth 3 (three) times daily.   21 capsule   0   . sulfamethoxazole-trimethoprim (BACTRIM) 400-80 MG per tablet   Oral   Take 1 tablet by mouth 2 (two) times daily.   14 tablet   0     Allergies Penicillins  No family history on file.  Social History Social History  Substance Use Topics  . Smoking status: Never Smoker   . Smokeless tobacco: Never Used  . Alcohol Use: No    Review of Systems Unable to obtain from the patient secondary to dementia, please see history of present  illness above for more details  ____________________________________________   PHYSICAL EXAM:  VITAL SIGNS: ED Triage Vitals  Enc Vitals Group     BP 12/07/14 0758 155/87 mmHg     Pulse Rate 12/07/14 0758 58     Resp --      Temp 12/07/14 0758 98 F (36.7 C)     Temp Source 12/07/14 0758 Oral     SpO2 12/07/14 0758 98 %     Weight --      Height --      Head Cir --      Peak Flow --      Pain Score --      Pain Loc --      Pain Edu? --      Excl. in [place]? --     Constitutional: Alert and oriented to self only.  She does her daughter.  No acute distress. Eyes: Conjunctivae are normal. PERRL. EOMI. Head: Atraumatic. Nose: No congestion/rhinnorhea. Mouth/Throat: Mucous membranes are moist.  Oropharynx non-erythematous. Neck: No stridor.   Cardiovascular: Normal rate, regular rhythm. Grossly normal heart sounds.  Good peripheral circulation. Respiratory: Normal respiratory effort.  No retractions. Lungs CTAB. Gastrointestinal: Soft and nontender. No distention. No abdominal bruits. No CVA tenderness.  Rectal exam reveals normal colored stool that is heme negative, quality control passed. Genitourinary: Speculum exam reveals a small amount of dark blood in  the vaginal canal that appears to be coming from the cervix.  I see no masses or lesions of the cervix or vagina to explain the blood.  The exam was nontender. Musculoskeletal: No lower extremity tenderness nor edema.  No joint effusions. Neurologic:  Normal speech and language. No gross focal neurologic deficits are appreciated.  Skin:  Skin is warm, dry and intact. No rash noted.   ____________________________________________   LABS (all labs ordered are listed, but only abnormal results are displayed)  Labs Reviewed  CBC WITH DIFFERENTIAL/PLATELET - Abnormal; Notable for the following:    RDW 15.3 (*)    All other components within normal limits   ____________________________________________  EKG  Not  indicated ____________________________________________  RADIOLOGY  US Pelvis Complete  12/07/2014   CLINICAL DATA:  Vaginal bleeding  EXAM: TRANSABDOMINAL ULTRASOUND OF PELVIS  TECHNIQUE: Transabdominal ultrasound examination of the pelvis was performed including evaluation of the uterus, ovaries, adnexal regions, and pelvic cul-de-sac.  COMPARISON:  04/30/2009  FINDINGS: Uterus  Measurements: 10.5 x 3.9 x 6.7 cm. Multiple calcifications are noted throughout the uterus. These are similar to that seen on a prior CT examination. These are not felt to represent uterine fibroids but more likely parenchymal calcification.  Endometrium  Thickness: 7.7 mm. This is mildly prominent for the patient's given age.  Right ovary  Not well visualized  Left ovary  Measurements: 1.8 x 0.8 x 1.3 cm. Normal appearance/no adnexal mass.  Other findings:  No free fluid  IMPRESSION: Mildly prominent endometrium of uncertain significance. No focal mass lesion is noted in the endometrium.  Nonvisualization of the right ovary.   Electronically Signed   By: Inez Catalina M.D.   On: 12/07/2014 14:50    ____________________________________________   PROCEDURES  Procedure(s) performed: None  Critical Care performed: No ____________________________________________   INITIAL IMPRESSION / ASSESSMENT AND PLAN / ED COURSE  Pertinent labs & imaging results that were available during my care of the patient were reviewed by me and considered in my medical decision making (see chart for details).  The patient is well-appearing, in no acute distress, has normal vital signs, and no complaints.  She does have a small amount of blood in her vagina which is concerning and her age.  She also has a slightly thickened endometrium on ultrasound.  I discussed the case with Dr. Ouida Sills who agreed to see the patient in the clinic this coming week.  I gave the patient's daughter an update and she understands and agrees with the  plan.  ____________________________________________  FINAL CLINICAL IMPRESSION(S) / ED DIAGNOSES  Final diagnoses:  Postmenopausal vaginal bleeding      NEW MEDICATIONS STARTED DURING THIS VISIT:  New Prescriptions   No medications on file     Hinda Kehr, MD 12/07/14 1745

## 2014-12-07 NOTE — ED Notes (Signed)
Daughter reports her mother has bleeding coming from vaginal area or rectum she is not sure. Daughter does not know when it started. Pt states her tissue is red when she wipes. Unable to tell me the color of stool or if she is on anticoagulants.

## 2014-12-07 NOTE — ED Notes (Signed)
Patient is alert, appears comfortable. Patient drank one 8oz cup of water without difficulty. Patient is being transported to [facility]/S by U/ S tech.

## 2014-12-16 ENCOUNTER — Encounter: Payer: Self-pay | Admitting: *Deleted

## 2014-12-17 ENCOUNTER — Inpatient Hospital Stay: Payer: Medicare Other | Attending: Obstetrics and Gynecology | Admitting: Obstetrics and Gynecology

## 2014-12-17 ENCOUNTER — Inpatient Hospital Stay: Payer: Medicare Other

## 2014-12-17 VITALS — BP 153/87 | HR 66 | Temp 98.1°F | Ht 62.0 in | Wt 140.0 lb

## 2014-12-17 DIAGNOSIS — I2510 Atherosclerotic heart disease of native coronary artery without angina pectoris: Secondary | ICD-10-CM | POA: Diagnosis not present

## 2014-12-17 DIAGNOSIS — C541 Malignant neoplasm of endometrium: Secondary | ICD-10-CM | POA: Insufficient documentation

## 2014-12-17 DIAGNOSIS — I10 Essential (primary) hypertension: Secondary | ICD-10-CM | POA: Diagnosis not present

## 2014-12-17 DIAGNOSIS — G3183 Dementia with Lewy bodies: Secondary | ICD-10-CM

## 2014-12-17 DIAGNOSIS — F0390 Unspecified dementia without behavioral disturbance: Secondary | ICD-10-CM | POA: Insufficient documentation

## 2014-12-17 DIAGNOSIS — R54 Age-related physical debility: Secondary | ICD-10-CM | POA: Diagnosis not present

## 2014-12-17 DIAGNOSIS — F0280 Dementia in other diseases classified elsewhere without behavioral disturbance: Secondary | ICD-10-CM | POA: Diagnosis not present

## 2014-12-17 DIAGNOSIS — Z79899 Other long term (current) drug therapy: Secondary | ICD-10-CM | POA: Diagnosis not present

## 2014-12-17 LAB — CREATININE, SERUM: Creatinine, Ser: 0.77 mg/dL (ref 0.44–1.00)

## 2014-12-17 NOTE — Progress Notes (Signed)
Gynecologic Oncology Consult Visit   Referring Provider: Dr. Laverta Baltimore  Chief Concern: endometrial cancer  Subjective:  Sara Heath is a 81 y.o. female who is seen in consultation from Dr. Ouida Sills for poorly differentiated endometrial cancer.   [name] presented with abnormal vaginal bleeding. She had a pelvic ultrasound on 12/07/2014 that revealed the following:   Measurements: Uterus 0.5 x 3.9 x 6.7 cm. Multiple calcifications are noted throughout the uterus. These are similar to that seen on a prior CT examination. These are not felt to represent uterine fibroids but more likely parenchymal calcification.  Endometrium  Thickness: 7.7 mm.  Right ovary Not well visualized Left ovary Measurements: 1.8 x 0.8 x 1.3 cm. \  EMBx poorly differentiated endometrioid adenocarcinoma.   She and her family present today to discuss management options.    Past Medical History: Past Medical History  Diagnosis Date  . Hypertension   . Dementia   . Endometrial cancer   . Chronic headache   . Vulvar dystrophy   . Gout   . Angioedema   . UTI (lower urinary tract infection)   . Breast mass, left   . Malaise and fatigue   . Sleep apnea   . Chronic back pain   . Weight loss   . Edema of both legs   . Torticollis   . Hematuria   . Vaginal candidiasis   . CAD (coronary artery disease)   . Hemorrhagic cystitis   . Dementia     Past Surgical History: Past Surgical History  Procedure Laterality Date  . Tubal ligation    . Left breast biopsy    . Coronary angioplasty with stent placement      Past Gynecologic History:  See HPI  OB History:  OB History  Gravida Para Term Preterm AB SAB TAB Ectopic Multiple Living  11 8           Family History: History reviewed. No pertinent family history.  Social History: Social History   Social History  . Marital Status: Widowed    Spouse Name: N/A  . Number of Children: N/A  . Years of Education: N/A    Occupational History  . Not on file.   Social History Main Topics  . Smoking status: Never Smoker   . Smokeless tobacco: Never Used  . Alcohol Use: No  . Drug Use: Not on file  . Sexual Activity: Not on file   Other Topics Concern  . Not on file   Social History Narrative    Allergies: Allergies  Allergen Reactions  . Penicillins Other (See Comments)    Current Medications: Current Outpatient Prescriptions  Medication Sig Dispense Refill  . aspirin EC 81 MG tablet Take by mouth.    . cephALEXin (KEFLEX) 500 MG capsule Take 1 capsule (500 mg total) by mouth 3 (three) times daily. 21 capsule 0  . cetirizine (ZYRTEC) 10 MG tablet     . furosemide (LASIX) 40 MG tablet Take by mouth.    . isosorbide mononitrate (IMDUR) 120 MG 24 hr tablet Take by mouth.    Marland Kitchen lisinopril (PRINIVIL,ZESTRIL) 10 MG tablet Take by mouth.    . metoprolol succinate (TOPROL-XL) 50 MG 24 hr tablet     . nitrofurantoin, macrocrystal-monohydrate, (MACROBID) 100 MG capsule take 1 capsule by mouth twice a day for 5 days for bladder infection  0  . nitroGLYCERIN (NITROSTAT) 0.4 MG SL tablet Place under the tongue.    Marland Kitchen RA ANTACID/ANTI-GAS MAX ST  400-400-40 MG/5ML suspension take 10 to 20 milliliters by mouth UP TO twice a day for CHEST PAIN AFTER EATING  0  . RA COL-RITE 100 MG capsule take 1 capsule by mouth twice a day if needed for STOOL SOFTENING  0  . simvastatin (ZOCOR) 20 MG tablet     . sulfamethoxazole-trimethoprim (BACTRIM) 400-80 MG per tablet Take 1 tablet by mouth 2 (two) times daily. 14 tablet 0   No current facility-administered medications for this visit.    Review of Systems   General: weight loss  HEENT: no complaints  Lungs: no complaints  Cardiac: chest pain  GI: diarrhea  GU: urinary frequency and hematuria; vaginal bleeding  Musculoskeletal: no complaints  Extremities: no complaints  Skin: no complaints  Neuro: no complaints  Endocrine: no complaints  Psych: no  complaints Heme: easy bruising       Objective:  Physical Examination:  BP 153/87 mmHg  Pulse 66  Temp(Src) 98.1 F (36.7 C) (Tympanic)  Ht 5\' 2"  (1.575 m)  Wt 140 lb (63.504 kg)  BMI 25.60 kg/m2   ECOG Performance Status: 3 - Symptomatic, >50% confined to bed or bed. Does not make her own meals, but can dress on her own  General appearance: appears stated age and no distress HEENT:PERRLA, extra ocular movement intact and sclera clear, anicteric Lymph node survey: non-palpable, axillary, inguinal, supraclavicular Cardiovascular: regular rate and rhythm Respiratory: normal air entry, lungs clear to auscultation; decreased BS bilaterally Abdomen: soft, non-tender, without masses or organomegaly, no hernias or ascites Back: inspection of back is normal Extremities: edema bilaterally; chronic per the family with stasis dermatitis bilaterally Neurological exam reveals patient able to follow commands and interact by shaking hands. She did not speak.   Pelvic: exam chaperoned by nurse;  Vulva: normal appearing vulva with no masses, tenderness or lesions; Vagina: normal vagina; Adnexa: normal adnexa in size, nontender and no masses; Uterus: uterus unable to determine size and shape, but nontender; Cervix: no lesions; Rectal: not indicated    Lab Review Labs on site today: Lab Results  Component Value Date   WBC 4.2 12/07/2014   HGB 12.7 12/07/2014   HCT 39.6 12/07/2014   MCV 94.9 12/07/2014   PLT 160 12/07/2014   Lab Results  Component Value Date   CREATININE 0.77 12/17/2014   CREATININE 0.94 10/02/2014   CREATININE 0.95 05/08/2013     Radiologic Imaging: CT scan C/A/P pending    Assessment:  Sara Heath is a 81 y.o. female diagnosed with grade 3 endometrial cancer. Medical co-morbidities complicating care: Dementia, frailty, and CAD. Plan:   Problem List Items Addressed This Visit    Endometrial cancer     Visit Diagnoses    Endometrial cancer    -  Primary     Dementia        We discussed options for management including surgery or radiation therapy. Given the grade of the cancer I do not think hormonal therapy is appropriate. Based on findings , I recommended recommended CT scan of the C/A/P to assess for disease status and suggested return to clinic in  1 weeks for follow up.  We did review surgical risks. These include infection, anesthesia, bleeding, transfusion, wound separation, medical issues (blood clots, stroke, heart attack, fluid in the lungs, pneumonia, abnormal heart rhythm, death), possible exploratory surgery with larger incision, lymphedema, lymphocyst, allergic reaction.  We also reviewed radiation. Advantages and disadvantages of both surgery and radiation were reviewed.      I  also reviewed importance of having a family discussion and appointing HCPOA. The two primary providers now are her grand-daughter, [name] [phone], and her daughter, Quitman Livings [phone].  The patient's diagnosis, an outline of the further diagnostic and laboratory studies which will be required, the recommendation, and alternatives were discussed.  All questions were answered to the patient's satisfaction.   A total of 60 minutes were spent with the patient/family today; 30% was spent in education, counseling and coordination of care for endometrial cancer.    Gillis Ends, MD    CC:  Dr. Laverta Baltimore

## 2014-12-17 NOTE — Patient Instructions (Signed)
Uterine Cancer Uterine cancer is an abnormal growth of tissue (tumor) in the uterus that is cancerous (malignant). Unlike noncancerous (benign) tumors, malignant tumors can spread to other parts of your body. The wall of the uterus has two layers of tissue. The inner layer is the endometrium. The outer layer of muscle tissue is the myometrium. The most common type of uterine cancer begins in the endometrium. This is called endometrial cancer. Cancer that begins in the myometrium is called uterine sarcoma, which is very rare.  RISK FACTORS  Although the exact cause of uterine cancer is unknown, there are a number of risk factors that can increase your chances of getting uterine cancer. They include:  Your age. Uterine cancer occurs mostly in women older than 50 years.   Having an enlarged endometrium (endometrial hyperplasia).   Using hormone therapy.   Obesity.   Taking the drug tamoxifen.   White race.   Infertility.   Never being pregnant.   Beginning menstrual periods at an age younger than 12 years.   Having menstrual periods at an age older than 59 years.   Personal history of ovarian, intestinal, or colorectal cancer.   Having a family history of uterine cancer.   Having a family history of hereditary nonpolyposis colon cancer (HNPCC).   Having diabetes, high blood pressure, thyroid disease, or gallbladder disease.   Long-term use of high-dose birth control pills.   Exposure to radiation.   Smoking.  SIGNS AND SYMPTOMS   Abnormal vaginal bleeding or discharge. Bleeding may start as a watery, blood-streaked flow that gradually contains more blood.   Any vaginal bleeding after menopause.   Difficult or painful urination.   Pain during intercourse.   Pain in the pelvic area.  Mass in the vagina.  Pain or fullness in the abdomen.  Frequent urination.  Bleeding between periods.  Growth of the stomach.   Unexplained weight loss.   Uterine cancer usually occurs after menopause. However, it may also occur around the time that menopause begins. Abnormal vaginal bleeding is the most common symptom of uterine cancer. Women should not assume that abnormal vaginal bleeding is part of menopause. DIAGNOSIS  Your health care provider will ask about your medical history. He or she may also perform a number of procedures, such as:  A physical and pelvic exam. Your health care provider will feel your pelvis for any lumps.   Blood and urine tests.   X-rays.   Imaging tests, such as CT scans, ultrasonography, or MRIs.   A hysteroscopy to view the inside of your uterus.   A Pap test to sample cells from the cervix and upper vagina to check for abnormal cells.   Taking a tissue sample (biopsy) from the uterine lining to look for cancer cells.   A dilation and curettage (D&C). This involves stretching (dilation) the cervix and scraping (curettage) the inside lining of the uterus to get a tissue sample. The sample is examined under a microscope to look for cancer cells.  Your cancer will be staged to determine its severity and extent. Staging is a careful attempt to find out the size of the tumor, whether the cancer has spread, and if so, to what parts of the body. You may need to have more tests to determine the stage of your cancer. The test results will help determine what treatment plan is best for you. Cancer stages include:   Stage I. The cancer is only found in the uterus.  Stage II. The  cancer has spread to the cervix.  Stage III. The cancer has spread outside the uterus, but not outside the pelvis. The cancer may have spread to the lymph nodes in the pelvis.  Stage IV. The cancer has spread to other parts of the body, such as the bladder or rectum. TREATMENT  Most women with uterine cancer are treated with surgery. This includes removing the uterus, cervix, fallopian tubes, and ovaries (total hysterectomy). Your  lymph nodes near the tumor may also be removed. Some women have radiation, chemotherapy, or hormonal therapy. Other women have a combination of these therapies. HOME CARE INSTRUCTIONS   Take medicines only as directed by your health care provider.   Maintain a healthy diet.  Exercise regularly.   If you have diabetes, high blood pressure, thyroid disease, or gallbladder disease, follow your health care provider's instructions to keep it under control.   Do not smoke.   Consider joining a support group. This may help you learn to cope with the stress of having uterine cancer.   Seek advice to help you manage treatment side effects.   Keep all follow-up visits as directed by your health care provider.  SEEK MEDICAL CARE IF:  You have increased stomach or pelvic pain.  You cannot urinate.  You have abnormal bleeding. Document Released: 04/11/2005 Document Revised: 08/26/2013 Document Reviewed: 09/28/2012 [facility] Patient Information 2015 [place], [place]. This information is not intended to replace advice given to you by your health care provider. Make sure you discuss any questions you have with your health care provider.

## 2014-12-17 NOTE — Progress Notes (Signed)
Gynecologic Oncology Consult Visit   Referring Provider: Dr. Laverta Baltimore  Chief Concern: endometrial cancer  Subjective:  Sara Heath is a 81 y.o. female who is seen in consultation from Dr. Ouida Sills for poorly differentiated endometrial cancer. Of note Sara Heath has dementia and is not capable of making her own decisions. She does not have HCPOA, but she does have a very supportive family.   Sara Heath presented with abnormal vaginal bleeding. She had a pelvic ultrasound on 12/07/2014 that revealed the following:   Measurements: Uterus 0.5 x 3.9 x 6.7 cm. Multiple calcifications are noted throughout the uterus. These are similar to that seen on a prior CT examination. These are not felt to represent uterine fibroids but more likely parenchymal calcification.  Endometrium  Thickness: 7.7 mm.  Right ovary Not well visualized Left ovary Measurements: 1.8 x 0.8 x 1.3 cm. \  EMBx poorly differentiated endometrioid adenocarcinoma   Past Medical History: Past Medical History  Diagnosis Date  . Hypertension   . Dementia   . Endometrial cancer   . Chronic headache   . Vulvar dystrophy   . Gout   . Angioedema   . UTI (lower urinary tract infection)   . Breast mass, left   . Malaise and fatigue   . Sleep apnea   . Chronic back pain   . Weight loss   . Edema of both legs   . Torticollis   . Hematuria   . Vaginal candidiasis   . CAD (coronary artery disease)   . Hemorrhagic cystitis   . Dementia     Past Surgical History: Past Surgical History  Procedure Laterality Date  . Tubal ligation    . Left breast biopsy    . Coronary angioplasty with stent placement      Past Gynecologic History:  See HPI  OB History:  OB History  Gravida Para Term Preterm AB SAB TAB Ectopic Multiple Living  11 8            Family History: History reviewed. No pertinent family history.  Social History: Social History   Social History  . Marital Status: Widowed   Spouse Name: N/A  . Number of Children: N/A  . Years of Education: N/A   Occupational History  . Not on file.   Social History Main Topics  . Smoking status: Never Smoker   . Smokeless tobacco: Never Used  . Alcohol Use: No  . Drug Use: Not on file  . Sexual Activity: Not on file   Other Topics Concern  . Not on file   Social History Narrative    Allergies: Allergies  Allergen Reactions  . Penicillins Other (See Comments)    Current Medications: Current Outpatient Prescriptions  Medication Sig Dispense Refill  . aspirin EC 81 MG tablet Take by mouth.    . cephALEXin (KEFLEX) 500 MG capsule Take 1 capsule (500 mg total) by mouth 3 (three) times daily. 21 capsule 0  . cetirizine (ZYRTEC) 10 MG tablet     . furosemide (LASIX) 40 MG tablet Take by mouth.    . isosorbide mononitrate (IMDUR) 120 MG 24 hr tablet Take by mouth.    Marland Kitchen lisinopril (PRINIVIL,ZESTRIL) 10 MG tablet Take by mouth.    . metoprolol succinate (TOPROL-XL) 50 MG 24 hr tablet     . nitrofurantoin, macrocrystal-monohydrate, (MACROBID) 100 MG capsule take 1 capsule by mouth twice a day for 5 days for bladder infection  0  . nitroGLYCERIN (NITROSTAT) 0.4 MG  SL tablet Place under the tongue.    Marland Kitchen RA ANTACID/ANTI-GAS MAX ST 400-400-40 MG/5ML suspension take 10 to 20 milliliters by mouth UP TO twice a day for CHEST PAIN AFTER EATING  0  . RA COL-RITE 100 MG capsule take 1 capsule by mouth twice a day if needed for STOOL SOFTENING  0  . simvastatin (ZOCOR) 20 MG tablet     . sulfamethoxazole-trimethoprim (BACTRIM) 400-80 MG per tablet Take 1 tablet by mouth 2 (two) times daily. 14 tablet 0   No current facility-administered medications for this visit.    Review of Systems   General: weight loss  HEENT: no complaints  Lungs: no complaints  Cardiac: chest pain  GI: diarrhea  GU: urinary frequency and hematuria; vaginal bleeding  Musculoskeletal: no complaints  Extremities: no complaints  Skin: no complaints   Neuro: no complaints  Endocrine: no complaints  Psych: no complaints Heme: easy bruising       Objective:  Physical Examination:  BP 153/87 mmHg  Pulse 66  Temp(Src) 98.1 F (36.7 C) (Tympanic)     ECOG Performance Status: 3 - Symptomatic, >50% confined to bed or chair  General appearance: appears stated age and no distress HEENT:PERRLA and sclera clear, anicteric Lymph node survey: non-palpable, axillary, inguinal, supraclavicular Cardiovascular: regular rate and rhythm Respiratory: normal air entry, lungs clear to auscultation Abdomen: soft, non-tender, without masses or organomegaly and no hernias and no ascites Back: inspection of back is normal Extremities: edema bilateral and symmetrical. Per patient family she has chronic edema Neurological exam reveals Patient follows commands and shook my hand; she did not engage in the conversation.  Pelvic: exam chaperoned by nurse;  Vulva: normal appearing vulva with no masses, tenderness or lesions; Vagina: normal vagina; Adnexa: nontender and no masses; Uterus: unable to determine size, not grossly enlarged and nontender; Cervix: no lesions; Rectal: not indicated    Lab Review Labs on site today: Lab Results  Component Value Date   WBC 4.2 12/07/2014   HGB 12.7 12/07/2014   HCT 39.6 12/07/2014   MCV 94.9 12/07/2014   PLT 160 12/07/2014    Creatinine pending  Radiologic Imaging: CT C/A/P ordered    Assessment:  Sara Heath is a 81 y.o. female diagnosed with grade 3 endometrial cancer. Medical co-morbidities complicating care: Dementia, frailty, and CAD. Plan:   Problem List Items Addressed This Visit    Endometrial cancer     Visit Diagnoses    Endometrial cancer    -  Primary    Dementia        We discussed options for management including surgery or radiation therapy. Given the grade of the cancer I do not think hormonal therapy is appropriate. Based on findings, we recommended recommended CT scan of  the C/A/P to assess for disease status and suggested return to clinic in  1 weeks for follow up.    We discussed that grade 3 cancers, are more aggressive biology and less-favorable prognosis.  As a result, adjuvant therapy if often recommended. We reviewed risks of s  The patient's diagnosis, an outline of the further diagnostic and laboratory studies which will be required, the recommendation, and alternatives were discussed.  All questions were answered to the patient's satisfaction.  Gillis Ends, MD    CC:  Dr. Laverta Baltimore

## 2014-12-19 ENCOUNTER — Telehealth: Payer: Self-pay | Admitting: *Deleted

## 2014-12-19 NOTE — Telephone Encounter (Signed)
Received phone call from [employer] in [place] center prior auth. Ct scan chest is not being approved on Mrs. Sara Heath 1921-11-29.  The insurance will not consider approving the ct scan until the patient has had a chest xray.  CT scan of the chest will need to be cnl.  I reached out to Dr. Theora Gianotti to see if md could order a chest xray instead.  I also left a voice mail msg on daughter [name].

## 2014-12-21 ENCOUNTER — Other Ambulatory Visit: Payer: Self-pay | Admitting: Obstetrics and Gynecology

## 2014-12-21 ENCOUNTER — Ambulatory Visit: Payer: Self-pay

## 2014-12-21 DIAGNOSIS — C541 Malignant neoplasm of endometrium: Secondary | ICD-10-CM

## 2014-12-22 ENCOUNTER — Ambulatory Visit: Payer: Medicare Other

## 2014-12-22 ENCOUNTER — Other Ambulatory Visit: Payer: Self-pay | Admitting: *Deleted

## 2014-12-22 ENCOUNTER — Ambulatory Visit
Admission: RE | Admit: 2014-12-22 | Discharge: 2014-12-22 | Disposition: A | Discharge status: Home / Self Care | Payer: Medicare Other | Source: Ambulatory Visit | Attending: Obstetrics and Gynecology | Admitting: Obstetrics and Gynecology

## 2014-12-22 ENCOUNTER — Telehealth: Payer: Self-pay | Admitting: *Deleted

## 2014-12-22 DIAGNOSIS — N200 Calculus of kidney: Secondary | ICD-10-CM | POA: Insufficient documentation

## 2014-12-22 DIAGNOSIS — C541 Malignant neoplasm of endometrium: Secondary | ICD-10-CM | POA: Insufficient documentation

## 2014-12-22 HISTORY — DX: Unspecified asthma, uncomplicated: J45.909

## 2014-12-22 MED ORDER — IOHEXOL 300 MG/ML  SOLN
100.0000 mL | Freq: Once | INTRAMUSCULAR | Status: AC | PRN
  Administered 2014-12-22: 100 mL via INTRAVENOUS

## 2014-12-22 NOTE — Telephone Encounter (Signed)
Order for cxr dated for 8/31. Order not able to be released due to 'future order' need order entered for today's date.  New order generated per md orders.

## 2014-12-24 ENCOUNTER — Telehealth: Payer: Self-pay | Admitting: Obstetrics and Gynecology

## 2014-12-24 NOTE — Telephone Encounter (Signed)
I contacted Ms. [facility] care provider, [name] [phone], regarding her CXR and CT scan. There is no definitive evidence of cancer. I have recommended a cardiology consult for clearance. I spoke with Dr. Laverta Baltimore regarding her care and location for surgery. Given her comorbidities we both agreed that [facility] would be the location for surgery, if she is cleared. If she is not cleared for surgery then we will recommended radiation.  Gillis Ends, MD

## 2014-12-25 ENCOUNTER — Other Ambulatory Visit: Payer: Self-pay | Admitting: *Deleted

## 2014-12-25 DIAGNOSIS — C541 Malignant neoplasm of endometrium: Secondary | ICD-10-CM

## 2014-12-31 ENCOUNTER — Ambulatory Visit: Payer: Medicare Other

## 2014-12-31 ENCOUNTER — Inpatient Hospital Stay: Payer: Medicare Other

## 2015-02-26 ENCOUNTER — Telehealth: Payer: Self-pay | Admitting: *Deleted

## 2015-02-26 NOTE — Telephone Encounter (Signed)
I rcvd a phone call today, which was directly transferred to me.  [name] (the granddaughter of [name]) would like to know when her surgery will be planned at [facility]. She saw Dr. Alycia Rossetti at [facility], who recommended a stress test if surgery would be performed. According to the note, she is at [facility]. Risk. The patient has not seen Dr. Theora Gianotti since August 24'th for endometrial cancer.  Dr. Theora Gianotti, can you get with Dr. Alycia Rossetti to determine what else needs to be done and have your team there at [facility] call the patient with surgery apts at [facility]  Peggy's phone # is [phone].   Sara Heath 81 year old female 12-15-1921

## 2015-03-03 ENCOUNTER — Telehealth: Payer: Self-pay

## 2015-03-03 NOTE — Telephone Encounter (Signed)
  Oncology Nurse Navigator Documentation    Navigator Encounter Type: Telephone (03/03/15 1600)                      Time Spent with Patient: 15 (03/03/15 1600)   Spoke to granddaughter [name] on the phone. She is asking regarding any further treatment, XRT or surgery. Has not seen Dr Theora Gianotti since August. Saw cardiologist 02/13/15 Dr Alycia Rossetti at [facility] for cardiac clearance. Will put on schedule for 1400 11/9 for her to see Dr Theora Gianotti and discuss any further treatment at this time. She is agreeable with this plan.

## 2015-03-04 ENCOUNTER — Encounter: Payer: Self-pay | Admitting: Obstetrics and Gynecology

## 2015-03-04 ENCOUNTER — Inpatient Hospital Stay: Payer: Medicare Other | Attending: Obstetrics and Gynecology | Admitting: Obstetrics and Gynecology

## 2015-03-04 VITALS — BP 161/83 | HR 64 | Temp 97.8°F | Ht 62.0 in | Wt 140.0 lb

## 2015-03-04 DIAGNOSIS — R634 Abnormal weight loss: Secondary | ICD-10-CM | POA: Insufficient documentation

## 2015-03-04 DIAGNOSIS — N904 Leukoplakia of vulva: Secondary | ICD-10-CM | POA: Diagnosis not present

## 2015-03-04 DIAGNOSIS — Z955 Presence of coronary angioplasty implant and graft: Secondary | ICD-10-CM | POA: Diagnosis not present

## 2015-03-04 DIAGNOSIS — I10 Essential (primary) hypertension: Secondary | ICD-10-CM | POA: Insufficient documentation

## 2015-03-04 DIAGNOSIS — C541 Malignant neoplasm of endometrium: Secondary | ICD-10-CM | POA: Insufficient documentation

## 2015-03-04 DIAGNOSIS — R600 Localized edema: Secondary | ICD-10-CM | POA: Insufficient documentation

## 2015-03-04 DIAGNOSIS — Z3009 Encounter for other general counseling and advice on contraception: Secondary | ICD-10-CM

## 2015-03-04 DIAGNOSIS — Z9861 Coronary angioplasty status: Secondary | ICD-10-CM

## 2015-03-04 DIAGNOSIS — Z79899 Other long term (current) drug therapy: Secondary | ICD-10-CM

## 2015-03-04 DIAGNOSIS — I2510 Atherosclerotic heart disease of native coronary artery without angina pectoris: Secondary | ICD-10-CM | POA: Diagnosis not present

## 2015-03-04 DIAGNOSIS — F0390 Unspecified dementia without behavioral disturbance: Secondary | ICD-10-CM | POA: Insufficient documentation

## 2015-03-04 DIAGNOSIS — Z7982 Long term (current) use of aspirin: Secondary | ICD-10-CM | POA: Diagnosis not present

## 2015-03-04 DIAGNOSIS — Z7189 Other specified counseling: Secondary | ICD-10-CM | POA: Diagnosis not present

## 2015-03-04 NOTE — Progress Notes (Addendum)
Gynecologic Oncology Consult Visit   Referring Provider: Dr. Laverta Baltimore  Chief Concern: endometrial cancer  Subjective:  Sara Heath is a 81 y.o. female who is seen in consultation from Dr. Ouida Sills for poorly differentiated endometrial cancer. Of note Sara Heath has dementia and is not capable of making her own decisions. She does not have HCPOA, but she does have a very supportive family.   I previously contacted Sara Heath care provider, Sara Heath [phone], regarding her CXR and CT scan. There is no definitive evidence of cancer. On CT scan she did have a 8 mm PA node which is not grossly enlarged. I recommended a cardiology consult for clearance and she saw Dr. Venetia Maxon at [facility]. She was seen by Dr. Alycia Rossetti on 02/13/2015. I spoke with him personally. She has a 5% risk of MI and at least a 5% risk of cardiac failure. No further testing was done as he did not feel it would offer further valuable information. The patient is here today to review options.     Gynecologic Oncology  Sara Heath presented with abnormal vaginal bleeding. She had a pelvic ultrasound on 12/07/2014 that revealed the following:   Measurements: Uterus 10.5 x 3.9 x 6.7 cm. Multiple calcifications are noted throughout the uterus. These are similar to that seen on a prior CT examination. These are not felt to represent uterine fibroids but more likely parenchymal calcification.  Endometrium  Thickness: 7.7 mm.  Right ovary Not well visualized Left ovary Measurements: 1.8 x 0.8 x 1.3 cm.   EMBx poorly differentiated endometrioid adenocarcinoma  12/22/2014  CXR: No acute cardiopulmonary disease. CT A/P: IMPRESSION: 1. Similar to prior CT scan there is a large volume of sludge and perhaps numerous small calculi within the gallbladder. There is mild but progressive dilatation of the extrahepatic and intrahepatic bile ducts as well as of the pancreatic duct. Although there is  no pancreatic head mass, consider MRCP to further investigate. 2. Small adrenal nodules too small to characterize. Adrenal metastasis would be an unusual manifestation that endometrial cancer, but the possibility is not excluded. 3. Air within the endometrial canal presumably due to recent instrumentation 4. 8 mm retroperitoneal periaortic lymph node. Other smaller pelvic lymph nodes as described above. 5. Staghorn calculus left kidney  Past Medical History: Past Medical History  Diagnosis Date  . Hypertension   . Dementia   . Endometrial cancer   . Chronic headache   . Vulvar dystrophy   . Gout   . Angioedema   . UTI (lower urinary tract infection)   . Breast mass, left   . Malaise and fatigue   . Sleep apnea   . Chronic back pain   . Weight loss   . Edema of both legs   . Torticollis   . Hematuria   . Vaginal candidiasis   . CAD (coronary artery disease)   . Hemorrhagic cystitis   . Dementia   . Asthma     Past Surgical History: Past Surgical History  Procedure Laterality Date  . Tubal ligation    . Left breast biopsy    . Coronary angioplasty with stent placement      Past Gynecologic History:  See HPI  OB History:  OB History  Gravida Para Term Preterm AB SAB TAB Ectopic Multiple Living  11 8            Family History: No family history on file.  Social History: Social History   Social History  .  Marital Status: Widowed    Spouse Name: N/A  . Number of Children: N/A  . Years of Education: N/A   Occupational History  . Not on file.   Social History Main Topics  . Smoking status: Never Smoker   . Smokeless tobacco: Never Used  . Alcohol Use: No  . Drug Use: Not on file  . Sexual Activity: Not on file   Other Topics Concern  . Not on file   Social History Narrative    Allergies: Allergies  Allergen Reactions  . Penicillins Other (See Comments)    Current Medications: Current Outpatient Prescriptions  Medication Sig Dispense  Refill  . aspirin EC 81 MG tablet Take by mouth.    . cetirizine (ZYRTEC) 10 MG tablet     . furosemide (LASIX) 40 MG tablet Take by mouth.    . isosorbide mononitrate (IMDUR) 120 MG 24 hr tablet Take by mouth.    . metoprolol succinate (TOPROL-XL) 50 MG 24 hr tablet     . nitroGLYCERIN (NITROSTAT) 0.4 MG SL tablet Place under the tongue.    Marland Kitchen RA ANTACID/ANTI-GAS MAX ST 400-400-40 MG/5ML suspension take 10 to 20 milliliters by mouth UP TO twice a day for CHEST PAIN AFTER EATING  0  . RA COL-RITE 100 MG capsule take 1 capsule by mouth twice a day if needed for STOOL SOFTENING  0  . simvastatin (ZOCOR) 20 MG tablet      No current facility-administered medications for this visit.    Review of Systems   General: weight loss  HEENT: no complaints  Lungs: SOB, cough  Cardiac: chest pain  GI: diarrhea  GU: urinary frequency and hematuria; vaginal bleeding  Musculoskeletal: no complaints  Extremities: leg swelling bilaterally  Skin: no complaints  Neuro: no complaints  Endocrine: no complaints  Psych: no complaints Allergy: allergy issues       Objective:  Physical Examination:  BP 161/83 mmHg  Pulse 64  Temp(Src) 97.8 F (36.6 C) (Oral)  Ht 5\' 2"  (1.575 m)  Wt 139 lb 15.9 oz (63.5 kg)  BMI 25.60 kg/m2     ECOG Performance Status: 3 - Symptomatic, >50% confined to bed or chair  General appearance: appears stated age and no distress, cooperative but non interactive HEENT:PERRLA and sclera clear, anicteric Abdomen: soft, non-tender, without masses or organomegaly and no hernias and no ascites Back: inspection of back is normal Extremities: edema bilateral and symmetrical. Per patient family she has chronic edema Neurological exam reveals Patient follows commands and shook my hand; she did not engage in the conversation.  Pelvic: exam chaperoned by nurse;  Vulva: normal appearing vulva with no masses, tenderness or lesions; Vagina: normal vagina; Adnexa: nontender and no  masses; Uterus: unable to determine size, not grossly enlarged and nontender; Cervix: no lesions visually, but nodular on palpation, parametria was smooth bilaterally; Rectal: not indicated    Lab Review Labs on site today: Lab Results  Component Value Date   WBC 4.2 12/07/2014   HGB 12.7 12/07/2014   HCT 39.6 12/07/2014   MCV 94.9 12/07/2014   PLT 160 12/07/2014    Creatinine pending  Radiologic Imaging: CT C/A/P ordered    Assessment:  ARACELYS GLADE is a 81 y.o. female diagnosed with grade 3 endometrial cancer. Medical co-morbidities complicating care: Dementia, frailty, and CAD. Plan:   Problem List Items Addressed This Visit    Endometrial cancer     Visit Diagnoses    Endometrial cancer    -  Primary    Dementia        We discussed options for management including surgery or radiation therapy. Given the grade of the cancer I do not think hormonal therapy is appropriate.   We discussed that grade 3 cancers, are more aggressive biology and less-favorable prognosis.  As a result, adjuvant therapy if often recommended. We again reviewed risks of surgery  including infection, anesthesia, bleeding, transfusion, wound separation, medical issues (blood clots, stroke, heart attack, heart failure, fluid in the lungs, pneumonia, abnormal heart rhythm, death), possible exploratory surgery with larger incision, lymphedema, lymphocyst, allergic reaction, persistent scar tissue and pain. Given her frailty issues I do think she will have a complication, but surgery does afford higher likelihood of disease control and possibly cure in her situation.   I contacted Dr. Baruch Gouty and he recommended radiation therapy at [facility]. I spoke with Dr. Christel Mormon and he was able to discuss radiation side effects with the family. They are concerned about the travel issues to [facility] and would like to explore options at [facility].   They also asked about doing no therapy. I don't recommend that approach as she  did not have a strong contraindication against surgery and I worry about progression.   The patient's diagnosis, an outline of the further diagnostic and laboratory studies which will be required, the recommendation, and alternatives were discussed.  All questions were answered to the patient's satisfaction. We will discuss again on Friday to determine management plan.   Gillis Ends, MD   ADDENDUM: 03/12/2015 I have contacted Ms. Sellar's grand-daughter, Ms. Sara Heath, at [phone] on several occasions to determine optimal treatment plans. We have talked about surgery again. The family is interested in radiation and would prefer closer to home. I did contact [place] and they are happy to see Sara Heath, but then I also considered an alternative option with the patient receiving EBRT with Dr. Baruch Gouty in [facility] for primary treatment of endometrial cancer followed by an implant at [facility] for the brachytherapy treatment. Both Dr. Christel Mormon at [facility] and Dr. Bartholome Bill agree with this plan. We will have our team contact Dr. Olena Leatherwood nurse about Ms Heath to set up an appt to start radiation treatment for endometrial cancer. Sara Heath agrees with this plan Gillis Ends, MD   CC:  Dr. Laverta Baltimore

## 2015-03-04 NOTE — Progress Notes (Signed)
Assisted MD with pelvic exam

## 2015-03-12 ENCOUNTER — Telehealth: Payer: Self-pay

## 2015-03-12 NOTE — Telephone Encounter (Signed)
Appt arranged with Dr Baruch Gouty per Dr Theora Gianotti for 03-18-15 at 10:30. [employer] notified. Readbacl performed

## 2015-03-18 ENCOUNTER — Ambulatory Visit
Admission: RE | Admit: 2015-03-18 | Discharge: 2015-03-18 | Disposition: A | Discharge status: Home / Self Care | Payer: Medicare Other | Source: Ambulatory Visit | Attending: Radiation Oncology | Admitting: Radiation Oncology

## 2015-03-18 ENCOUNTER — Encounter: Payer: Self-pay | Admitting: Radiation Oncology

## 2015-03-18 VITALS — BP 115/66 | HR 72 | Temp 96.5°F | Wt 141.5 lb

## 2015-03-18 DIAGNOSIS — Z510 Encounter for antineoplastic radiation therapy: Secondary | ICD-10-CM | POA: Insufficient documentation

## 2015-03-18 DIAGNOSIS — R319 Hematuria, unspecified: Secondary | ICD-10-CM | POA: Insufficient documentation

## 2015-03-18 DIAGNOSIS — G4730 Sleep apnea, unspecified: Secondary | ICD-10-CM | POA: Insufficient documentation

## 2015-03-18 DIAGNOSIS — B373 Candidiasis of vulva and vagina: Secondary | ICD-10-CM | POA: Insufficient documentation

## 2015-03-18 DIAGNOSIS — Z8669 Personal history of other diseases of the nervous system and sense organs: Secondary | ICD-10-CM | POA: Insufficient documentation

## 2015-03-18 DIAGNOSIS — M436 Torticollis: Secondary | ICD-10-CM | POA: Insufficient documentation

## 2015-03-18 DIAGNOSIS — I10 Essential (primary) hypertension: Secondary | ICD-10-CM | POA: Insufficient documentation

## 2015-03-18 DIAGNOSIS — R609 Edema, unspecified: Secondary | ICD-10-CM | POA: Insufficient documentation

## 2015-03-18 DIAGNOSIS — F0390 Unspecified dementia without behavioral disturbance: Secondary | ICD-10-CM | POA: Insufficient documentation

## 2015-03-18 DIAGNOSIS — G8929 Other chronic pain: Secondary | ICD-10-CM | POA: Insufficient documentation

## 2015-03-18 DIAGNOSIS — I2510 Atherosclerotic heart disease of native coronary artery without angina pectoris: Secondary | ICD-10-CM | POA: Insufficient documentation

## 2015-03-18 DIAGNOSIS — N63 Unspecified lump in breast: Secondary | ICD-10-CM | POA: Insufficient documentation

## 2015-03-18 DIAGNOSIS — R531 Weakness: Secondary | ICD-10-CM | POA: Insufficient documentation

## 2015-03-18 DIAGNOSIS — J45909 Unspecified asthma, uncomplicated: Secondary | ICD-10-CM | POA: Insufficient documentation

## 2015-03-18 DIAGNOSIS — M549 Dorsalgia, unspecified: Secondary | ICD-10-CM | POA: Insufficient documentation

## 2015-03-18 DIAGNOSIS — N904 Leukoplakia of vulva: Secondary | ICD-10-CM | POA: Insufficient documentation

## 2015-03-18 DIAGNOSIS — Z79899 Other long term (current) drug therapy: Secondary | ICD-10-CM | POA: Insufficient documentation

## 2015-03-18 DIAGNOSIS — Z7982 Long term (current) use of aspirin: Secondary | ICD-10-CM | POA: Insufficient documentation

## 2015-03-18 DIAGNOSIS — R5383 Other fatigue: Secondary | ICD-10-CM | POA: Insufficient documentation

## 2015-03-18 DIAGNOSIS — Z87440 Personal history of urinary (tract) infections: Secondary | ICD-10-CM | POA: Insufficient documentation

## 2015-03-18 DIAGNOSIS — C541 Malignant neoplasm of endometrium: Secondary | ICD-10-CM

## 2015-03-18 NOTE — Consult Note (Signed)
Except an outstanding is perfect of Radiation Oncology NEW PATIENT EVALUATION  Name: Sara Heath  MRN: [id number]  Date:   03/18/2015     DOB: [birth year]   This 81 y.o. female patient presents to the clinic for initial evaluation of endometrial cancer.  REFERRING PHYSICIAN: Petra Kuba, MD  CHIEF COMPLAINT:  Chief Complaint  Patient presents with  . Cancer    Endometrial  initial evaluation for radiation treatment    DIAGNOSIS: The encounter diagnosis was Endometrial cancer ([place]).   PREVIOUS INVESTIGATIONS:  Pathology report reviewed Clinical notes reviewed CT scan abdomen pelvis reviewed  HPI: Patient is a 81 year old female with dementia who was noted to have postmenopausal bleeding was seen by gynecologist who performed biopsy of her endometrium showing poorly differentiated endometrial carcinoma. CT scan was performed showing an 8 mm retroperitoneal periaortic lymph node with other small pelvic lymph nodes. No discernible tumor noted in her endometrium. Her endometrium sounded at 10.5 x 4 x 6.7 cm. 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 Patient based on her age and comorbidities is not a surgical candidate and recommendation for radiation therapy was made. Patient is ordered been seen by GYN oncology. We have been asked to perform external beam portion of her radiation treatments and will follow-up with brachy therapy at [facility]. She is seen today and doing well. Daughters are present Place states she has very little vaginal bleeding at this time. No significant lower urinary tract symptoms or diarrhea.  PLANNED TREATMENT REGIMEN: External beam radiation therapy with possible brachy therapy treatment  PAST MEDICAL HISTORY:  has a past medical history of Hypertension; Dementia; Endometrial cancer ([place]); Chronic headache; Vulvar dystrophy; Gout; Angioedema; UTI (lower urinary tract infection); Breast mass, left; Malaise and fatigue; Sleep  apnea; Chronic back pain; Weight loss; Edema of both legs; Torticollis; Hematuria; Vaginal candidiasis; CAD (coronary artery disease); Hemorrhagic cystitis; Dementia; and Asthma.    PAST SURGICAL HISTORY:  Past Surgical History  Procedure Laterality Date  . Tubal ligation    . Left breast biopsy    . Coronary angioplasty with stent placement      FAMILY HISTORY: family history is not on file.  SOCIAL HISTORY:  reports that she has never smoked. She has never used smokeless tobacco. She reports that she does not drink alcohol.  ALLERGIES: Penicillins  MEDICATIONS:  Current Outpatient Prescriptions  Medication Sig Dispense Refill  . aspirin EC 81 MG tablet Take by mouth.    . cetirizine (ZYRTEC) 10 MG tablet     . docusate sodium (COLACE) 100 MG capsule Take by mouth.    . furosemide (LASIX) 40 MG tablet Take by mouth.    . indomethacin (INDOCIN) 25 MG capsule take 1 capsule by mouth three times a day for 5 to 7 DAYS.  0  . isosorbide mononitrate (IMDUR) 120 MG 24 hr tablet Take by mouth.    . metoprolol succinate (TOPROL-XL) 50 MG 24 hr tablet     . nitroGLYCERIN (NITROSTAT) 0.4 MG SL tablet Place under the tongue.    Marland Kitchen RA ANTACID/ANTI-GAS MAX ST 400-400-40 MG/5ML suspension take 10 to 20 milliliters by mouth UP TO twice a day for CHEST PAIN AFTER EATING  0  . RA COL-RITE 100 MG capsule take 1 capsule by mouth twice a day if needed for STOOL SOFTENING  0  . simvastatin (ZOCOR) 20 MG tablet      No current facility-administered medications for this encounter.    ECOG PERFORMANCE STATUS:  1 -  Symptomatic but completely ambulatory  REVIEW OF SYSTEMS: According to the daughters Patient denies any weight loss, fatigue, weakness, fever, chills or night sweats. Patient denies any loss of vision, blurred vision. Patient denies any ringing  of the ears or hearing loss. No irregular heartbeat. Patient denies heart murmur or history of fainting. Patient denies any chest pain or pain  radiating to her upper extremities. Patient denies any shortness of breath, difficulty breathing at night, cough or hemoptysis. Patient denies any swelling in the lower legs. Patient denies any nausea vomiting, vomiting of blood, or coffee ground material in the vomitus. Patient denies any stomach pain. Patient states has had normal bowel movements no significant constipation or diarrhea. Patient denies any dysuria, hematuria or significant nocturia. Patient denies any problems walking, swelling in the joints or loss of balance. Patient denies any skin changes, loss of hair or loss of weight. Patient denies any excessive worrying or anxiety or significant depression. Patient denies any problems with insomnia. Patient denies excessive thirst, polyuria, polydipsia. Patient denies any swollen glands, patient denies easy bruising or easy bleeding. Patient denies any recent infections, allergies or URI. Patient "s visual fields have not changed significantly in recent time.    PHYSICAL EXAM: BP 115/66 mmHg  Pulse 72  Temp(Src) 96.5 F (35.8 C)  Wt 141 lb 8.6 oz (64.2 kg) Wheelchair-bound elderly female in NAD very noncommunicative. Well-developed well-nourished patient in NAD. HEENT reveals PERLA, EOMI, discs not visualized.  Oral cavity is clear. No oral mucosal lesions are identified. Neck is clear without evidence of cervical or supraclavicular adenopathy. Lungs are clear to A&P. Cardiac examination is essentially unremarkable with regular rate and rhythm without murmur rub or thrill. Abdomen is benign with no organomegaly or masses noted. Motor sensory and DTR levels are equal and symmetric in the upper and lower extremities. Cranial nerves II through XII are grossly intact. Proprioception is intact. No peripheral adenopathy or edema is identified. No motor or sensory levels are noted. Crude visual fields are within normal range.  LABORATORY DATA: Pathology report is been requested for my review     RADIOLOGY RESULTS: CT scans are reviewed   IMPRESSION: Poorly differentiated endometrioid adenocarcinoma in 81 year old female with dementia nonsurgical candidate.  PLAN: At this time like to start her external beam portion of her treatment field. I would plan on delivering 4500 cGy over 5 weeks. I have ordered and scheduled CT simulation. Risks and benefits of treatment including increased lower urinary tract symptoms, diarrhea fatigue alteration of blood counts skin reaction all were described in detail to the patient and her family. Her daughter seem to comprehend our treatment plan well. Will be a challenge to see with her dementia whether she can remain cooperative her treatments although I believe that is certainly possible. I have set up and ordered CT simulation. After completion of external beam treatment will refer her to [facility] for possible brachytherapy treatment. Case was personally discussed with Dr. Theora Gianotti.  I would like to take this opportunity for allowing me to participate in the care of your patient.Armstead Peaks., MD

## 2015-03-23 ENCOUNTER — Ambulatory Visit
Admission: RE | Admit: 2015-03-23 | Discharge: 2015-03-23 | Disposition: A | Discharge status: Home / Self Care | Payer: Medicare Other | Source: Ambulatory Visit | Attending: Radiation Oncology | Admitting: Radiation Oncology

## 2015-03-23 DIAGNOSIS — R319 Hematuria, unspecified: Secondary | ICD-10-CM | POA: Diagnosis not present

## 2015-03-23 DIAGNOSIS — R531 Weakness: Secondary | ICD-10-CM | POA: Diagnosis not present

## 2015-03-23 DIAGNOSIS — I2510 Atherosclerotic heart disease of native coronary artery without angina pectoris: Secondary | ICD-10-CM | POA: Diagnosis not present

## 2015-03-23 DIAGNOSIS — R609 Edema, unspecified: Secondary | ICD-10-CM | POA: Diagnosis not present

## 2015-03-23 DIAGNOSIS — B373 Candidiasis of vulva and vagina: Secondary | ICD-10-CM | POA: Diagnosis not present

## 2015-03-23 DIAGNOSIS — J45909 Unspecified asthma, uncomplicated: Secondary | ICD-10-CM | POA: Diagnosis not present

## 2015-03-23 DIAGNOSIS — Z79899 Other long term (current) drug therapy: Secondary | ICD-10-CM | POA: Diagnosis not present

## 2015-03-23 DIAGNOSIS — G4730 Sleep apnea, unspecified: Secondary | ICD-10-CM | POA: Diagnosis not present

## 2015-03-23 DIAGNOSIS — R5383 Other fatigue: Secondary | ICD-10-CM | POA: Diagnosis not present

## 2015-03-23 DIAGNOSIS — G8929 Other chronic pain: Secondary | ICD-10-CM | POA: Diagnosis not present

## 2015-03-23 DIAGNOSIS — I10 Essential (primary) hypertension: Secondary | ICD-10-CM | POA: Diagnosis not present

## 2015-03-23 DIAGNOSIS — Z510 Encounter for antineoplastic radiation therapy: Secondary | ICD-10-CM | POA: Diagnosis not present

## 2015-03-23 DIAGNOSIS — N63 Unspecified lump in breast: Secondary | ICD-10-CM | POA: Diagnosis not present

## 2015-03-23 DIAGNOSIS — Z87440 Personal history of urinary (tract) infections: Secondary | ICD-10-CM | POA: Diagnosis not present

## 2015-03-23 DIAGNOSIS — N904 Leukoplakia of vulva: Secondary | ICD-10-CM | POA: Diagnosis not present

## 2015-03-23 DIAGNOSIS — C541 Malignant neoplasm of endometrium: Secondary | ICD-10-CM | POA: Diagnosis not present

## 2015-03-23 DIAGNOSIS — F0390 Unspecified dementia without behavioral disturbance: Secondary | ICD-10-CM | POA: Diagnosis not present

## 2015-03-23 DIAGNOSIS — M549 Dorsalgia, unspecified: Secondary | ICD-10-CM | POA: Diagnosis not present

## 2015-03-23 DIAGNOSIS — M436 Torticollis: Secondary | ICD-10-CM | POA: Diagnosis not present

## 2015-03-23 DIAGNOSIS — Z8669 Personal history of other diseases of the nervous system and sense organs: Secondary | ICD-10-CM | POA: Diagnosis not present

## 2015-03-23 DIAGNOSIS — Z7982 Long term (current) use of aspirin: Secondary | ICD-10-CM | POA: Diagnosis not present

## 2015-03-24 ENCOUNTER — Ambulatory Visit: Payer: Medicare Other

## 2015-03-25 DIAGNOSIS — C541 Malignant neoplasm of endometrium: Secondary | ICD-10-CM | POA: Diagnosis not present

## 2015-03-27 ENCOUNTER — Other Ambulatory Visit: Payer: Self-pay | Admitting: *Deleted

## 2015-03-27 DIAGNOSIS — C541 Malignant neoplasm of endometrium: Secondary | ICD-10-CM

## 2015-03-30 ENCOUNTER — Ambulatory Visit
Admission: RE | Admit: 2015-03-30 | Discharge: 2015-03-30 | Disposition: A | Discharge status: Home / Self Care | Payer: Medicare Other | Source: Ambulatory Visit | Attending: Radiation Oncology | Admitting: Radiation Oncology

## 2015-03-30 DIAGNOSIS — C541 Malignant neoplasm of endometrium: Secondary | ICD-10-CM | POA: Diagnosis not present

## 2015-03-31 ENCOUNTER — Ambulatory Visit
Admission: RE | Admit: 2015-03-31 | Discharge: 2015-03-31 | Disposition: A | Discharge status: Home / Self Care | Payer: Medicare Other | Source: Ambulatory Visit | Attending: Radiation Oncology | Admitting: Radiation Oncology

## 2015-03-31 DIAGNOSIS — C541 Malignant neoplasm of endometrium: Secondary | ICD-10-CM | POA: Diagnosis not present

## 2015-04-01 ENCOUNTER — Ambulatory Visit
Admission: RE | Admit: 2015-04-01 | Discharge: 2015-04-01 | Disposition: A | Discharge status: Home / Self Care | Payer: Medicare Other | Source: Ambulatory Visit | Attending: Radiation Oncology | Admitting: Radiation Oncology

## 2015-04-01 DIAGNOSIS — C541 Malignant neoplasm of endometrium: Secondary | ICD-10-CM | POA: Diagnosis not present

## 2015-04-02 ENCOUNTER — Ambulatory Visit
Admission: RE | Admit: 2015-04-02 | Discharge: 2015-04-02 | Disposition: A | Discharge status: Home / Self Care | Payer: Medicare Other | Source: Ambulatory Visit | Attending: Radiation Oncology | Admitting: Radiation Oncology

## 2015-04-02 DIAGNOSIS — C541 Malignant neoplasm of endometrium: Secondary | ICD-10-CM | POA: Diagnosis not present

## 2015-04-03 ENCOUNTER — Ambulatory Visit
Admission: RE | Admit: 2015-04-03 | Discharge: 2015-04-03 | Disposition: A | Discharge status: Home / Self Care | Payer: Medicare Other | Source: Ambulatory Visit | Attending: Radiation Oncology | Admitting: Radiation Oncology

## 2015-04-03 DIAGNOSIS — C541 Malignant neoplasm of endometrium: Secondary | ICD-10-CM | POA: Diagnosis not present

## 2015-04-06 ENCOUNTER — Ambulatory Visit
Admission: RE | Admit: 2015-04-06 | Discharge: 2015-04-06 | Disposition: A | Discharge status: Home / Self Care | Payer: Medicare Other | Source: Ambulatory Visit | Attending: Radiation Oncology | Admitting: Radiation Oncology

## 2015-04-06 DIAGNOSIS — C541 Malignant neoplasm of endometrium: Secondary | ICD-10-CM | POA: Diagnosis not present

## 2015-04-07 ENCOUNTER — Ambulatory Visit
Admission: RE | Admit: 2015-04-07 | Discharge: 2015-04-07 | Disposition: A | Discharge status: Home / Self Care | Payer: Medicare Other | Source: Ambulatory Visit | Attending: Radiation Oncology | Admitting: Radiation Oncology

## 2015-04-07 ENCOUNTER — Inpatient Hospital Stay: Payer: Medicare Other | Attending: Radiation Oncology

## 2015-04-07 DIAGNOSIS — C541 Malignant neoplasm of endometrium: Secondary | ICD-10-CM | POA: Insufficient documentation

## 2015-04-07 LAB — CBC
HEMATOCRIT: 42.9 % (ref 35.0–47.0)
HEMOGLOBIN: 14 g/dL (ref 12.0–16.0)
MCH: 30.9 pg (ref 26.0–34.0)
MCHC: 32.6 g/dL (ref 32.0–36.0)
MCV: 94.8 fL (ref 80.0–100.0)
Platelets: 158 10*3/uL (ref 150–440)
RBC: 4.52 MIL/uL (ref 3.80–5.20)
RDW: 14.6 % — AB (ref 11.5–14.5)
WBC: 4.2 10*3/uL (ref 3.6–11.0)

## 2015-04-08 ENCOUNTER — Ambulatory Visit
Admission: RE | Admit: 2015-04-08 | Discharge: 2015-04-08 | Disposition: A | Discharge status: Home / Self Care | Payer: Medicare Other | Source: Ambulatory Visit | Attending: Radiation Oncology | Admitting: Radiation Oncology

## 2015-04-08 DIAGNOSIS — C541 Malignant neoplasm of endometrium: Secondary | ICD-10-CM | POA: Diagnosis not present

## 2015-04-09 ENCOUNTER — Ambulatory Visit
Admission: RE | Admit: 2015-04-09 | Discharge: 2015-04-09 | Disposition: A | Discharge status: Home / Self Care | Payer: Medicare Other | Source: Ambulatory Visit | Attending: Radiation Oncology | Admitting: Radiation Oncology

## 2015-04-09 DIAGNOSIS — C541 Malignant neoplasm of endometrium: Secondary | ICD-10-CM | POA: Diagnosis not present

## 2015-04-10 ENCOUNTER — Ambulatory Visit
Admission: RE | Admit: 2015-04-10 | Discharge: 2015-04-10 | Disposition: A | Discharge status: Home / Self Care | Payer: Medicare Other | Source: Ambulatory Visit | Attending: Radiation Oncology | Admitting: Radiation Oncology

## 2015-04-10 DIAGNOSIS — C541 Malignant neoplasm of endometrium: Secondary | ICD-10-CM | POA: Diagnosis not present

## 2015-04-13 ENCOUNTER — Ambulatory Visit
Admission: RE | Admit: 2015-04-13 | Discharge: 2015-04-13 | Disposition: A | Discharge status: Home / Self Care | Payer: Medicare Other | Source: Ambulatory Visit | Attending: Radiation Oncology | Admitting: Radiation Oncology

## 2015-04-13 DIAGNOSIS — C541 Malignant neoplasm of endometrium: Secondary | ICD-10-CM | POA: Diagnosis not present

## 2015-04-14 ENCOUNTER — Inpatient Hospital Stay: Payer: Medicare Other

## 2015-04-14 ENCOUNTER — Ambulatory Visit
Admission: RE | Admit: 2015-04-14 | Discharge: 2015-04-14 | Disposition: A | Discharge status: Home / Self Care | Payer: Medicare Other | Source: Ambulatory Visit | Attending: Radiation Oncology | Admitting: Radiation Oncology

## 2015-04-14 DIAGNOSIS — C541 Malignant neoplasm of endometrium: Secondary | ICD-10-CM | POA: Diagnosis not present

## 2015-04-14 LAB — CBC
HEMATOCRIT: 42.5 % (ref 35.0–47.0)
HEMOGLOBIN: 13.8 g/dL (ref 12.0–16.0)
MCH: 30.9 pg (ref 26.0–34.0)
MCHC: 32.5 g/dL (ref 32.0–36.0)
MCV: 95.1 fL (ref 80.0–100.0)
Platelets: 118 10*3/uL — ABNORMAL LOW (ref 150–440)
RBC: 4.47 MIL/uL (ref 3.80–5.20)
RDW: 15 % — ABNORMAL HIGH (ref 11.5–14.5)
WBC: 3.3 10*3/uL — ABNORMAL LOW (ref 3.6–11.0)

## 2015-04-15 ENCOUNTER — Ambulatory Visit
Admission: RE | Admit: 2015-04-15 | Discharge: 2015-04-15 | Disposition: A | Discharge status: Home / Self Care | Payer: Medicare Other | Source: Ambulatory Visit | Attending: Radiation Oncology | Admitting: Radiation Oncology

## 2015-04-15 DIAGNOSIS — C541 Malignant neoplasm of endometrium: Secondary | ICD-10-CM | POA: Diagnosis not present

## 2015-04-16 ENCOUNTER — Ambulatory Visit
Admission: RE | Admit: 2015-04-16 | Discharge: 2015-04-16 | Disposition: A | Discharge status: Home / Self Care | Payer: Medicare Other | Source: Ambulatory Visit | Attending: Radiation Oncology | Admitting: Radiation Oncology

## 2015-04-16 DIAGNOSIS — C541 Malignant neoplasm of endometrium: Secondary | ICD-10-CM | POA: Diagnosis not present

## 2015-04-17 ENCOUNTER — Ambulatory Visit
Admission: RE | Admit: 2015-04-17 | Discharge: 2015-04-17 | Disposition: A | Discharge status: Home / Self Care | Payer: Medicare Other | Source: Ambulatory Visit | Attending: Radiation Oncology | Admitting: Radiation Oncology

## 2015-04-17 DIAGNOSIS — C541 Malignant neoplasm of endometrium: Secondary | ICD-10-CM | POA: Diagnosis not present

## 2015-04-21 ENCOUNTER — Inpatient Hospital Stay: Payer: Medicare Other

## 2015-04-21 ENCOUNTER — Ambulatory Visit
Admission: RE | Admit: 2015-04-21 | Discharge: 2015-04-21 | Disposition: A | Discharge status: Home / Self Care | Payer: Medicare Other | Source: Ambulatory Visit | Attending: Radiation Oncology | Admitting: Radiation Oncology

## 2015-04-21 DIAGNOSIS — C541 Malignant neoplasm of endometrium: Secondary | ICD-10-CM

## 2015-04-21 LAB — CBC
HEMATOCRIT: 38.6 % (ref 35.0–47.0)
HEMOGLOBIN: 12.5 g/dL (ref 12.0–16.0)
MCH: 30.8 pg (ref 26.0–34.0)
MCHC: 32.4 g/dL (ref 32.0–36.0)
MCV: 95.1 fL (ref 80.0–100.0)
Platelets: 133 10*3/uL — ABNORMAL LOW (ref 150–440)
RBC: 4.06 MIL/uL (ref 3.80–5.20)
RDW: 14.8 % — ABNORMAL HIGH (ref 11.5–14.5)
WBC: 3.9 10*3/uL (ref 3.6–11.0)

## 2015-04-22 ENCOUNTER — Ambulatory Visit
Admission: RE | Admit: 2015-04-22 | Discharge: 2015-04-22 | Disposition: A | Discharge status: Home / Self Care | Payer: Medicare Other | Source: Ambulatory Visit | Attending: Radiation Oncology | Admitting: Radiation Oncology

## 2015-04-22 DIAGNOSIS — C541 Malignant neoplasm of endometrium: Secondary | ICD-10-CM | POA: Diagnosis not present

## 2015-04-23 ENCOUNTER — Ambulatory Visit
Admission: RE | Admit: 2015-04-23 | Discharge: 2015-04-23 | Disposition: A | Discharge status: Home / Self Care | Payer: Medicare Other | Source: Ambulatory Visit | Attending: Radiation Oncology | Admitting: Radiation Oncology

## 2015-04-23 DIAGNOSIS — C541 Malignant neoplasm of endometrium: Secondary | ICD-10-CM | POA: Diagnosis not present

## 2015-04-24 ENCOUNTER — Ambulatory Visit
Admission: RE | Admit: 2015-04-24 | Discharge: 2015-04-24 | Disposition: A | Discharge status: Home / Self Care | Payer: Medicare Other | Source: Ambulatory Visit | Attending: Radiation Oncology | Admitting: Radiation Oncology

## 2015-04-24 DIAGNOSIS — C541 Malignant neoplasm of endometrium: Secondary | ICD-10-CM | POA: Diagnosis not present

## 2015-04-28 ENCOUNTER — Ambulatory Visit
Admission: RE | Admit: 2015-04-28 | Discharge: 2015-04-28 | Disposition: A | Discharge status: Home / Self Care | Payer: Medicare Other | Source: Ambulatory Visit | Attending: Radiation Oncology | Admitting: Radiation Oncology

## 2015-04-28 ENCOUNTER — Inpatient Hospital Stay: Payer: Medicare Other | Attending: Radiation Oncology

## 2015-04-28 DIAGNOSIS — C541 Malignant neoplasm of endometrium: Secondary | ICD-10-CM | POA: Diagnosis present

## 2015-04-28 LAB — CBC
HCT: 39.1 % (ref 35.0–47.0)
HEMOGLOBIN: 12.6 g/dL (ref 12.0–16.0)
MCH: 30.6 pg (ref 26.0–34.0)
MCHC: 32.4 g/dL (ref 32.0–36.0)
MCV: 94.7 fL (ref 80.0–100.0)
PLATELETS: 175 10*3/uL (ref 150–440)
RBC: 4.13 MIL/uL (ref 3.80–5.20)
RDW: 14.8 % — ABNORMAL HIGH (ref 11.5–14.5)
WBC: 2.9 10*3/uL — ABNORMAL LOW (ref 3.6–11.0)

## 2015-04-29 ENCOUNTER — Ambulatory Visit
Admission: RE | Admit: 2015-04-29 | Discharge: 2015-04-29 | Disposition: A | Discharge status: Home / Self Care | Payer: Medicare Other | Source: Ambulatory Visit | Attending: Radiation Oncology | Admitting: Radiation Oncology

## 2015-04-29 DIAGNOSIS — C541 Malignant neoplasm of endometrium: Secondary | ICD-10-CM | POA: Diagnosis not present

## 2015-04-30 ENCOUNTER — Ambulatory Visit
Admission: RE | Admit: 2015-04-30 | Discharge: 2015-04-30 | Disposition: A | Discharge status: Home / Self Care | Payer: Medicare Other | Source: Ambulatory Visit | Attending: Radiation Oncology | Admitting: Radiation Oncology

## 2015-04-30 DIAGNOSIS — C541 Malignant neoplasm of endometrium: Secondary | ICD-10-CM | POA: Diagnosis not present

## 2015-05-01 ENCOUNTER — Ambulatory Visit
Admission: RE | Admit: 2015-05-01 | Discharge: 2015-05-01 | Disposition: A | Discharge status: Home / Self Care | Payer: Medicare Other | Source: Ambulatory Visit | Attending: Radiation Oncology | Admitting: Radiation Oncology

## 2015-05-01 DIAGNOSIS — C541 Malignant neoplasm of endometrium: Secondary | ICD-10-CM | POA: Diagnosis not present

## 2015-05-04 ENCOUNTER — Ambulatory Visit: Payer: Medicare Other

## 2015-05-05 ENCOUNTER — Ambulatory Visit
Admission: RE | Admit: 2015-05-05 | Discharge: 2015-05-05 | Disposition: A | Discharge status: Home / Self Care | Payer: Medicare Other | Source: Ambulatory Visit | Attending: Radiation Oncology | Admitting: Radiation Oncology

## 2015-05-05 DIAGNOSIS — C541 Malignant neoplasm of endometrium: Secondary | ICD-10-CM | POA: Diagnosis not present

## 2015-05-06 ENCOUNTER — Ambulatory Visit: Payer: Medicare Other

## 2015-05-06 ENCOUNTER — Ambulatory Visit
Admission: RE | Admit: 2015-05-06 | Discharge: 2015-05-06 | Disposition: A | Discharge status: Home / Self Care | Payer: Medicare Other | Source: Ambulatory Visit | Attending: Radiation Oncology | Admitting: Radiation Oncology

## 2015-05-06 DIAGNOSIS — C541 Malignant neoplasm of endometrium: Secondary | ICD-10-CM | POA: Diagnosis not present

## 2015-05-07 ENCOUNTER — Ambulatory Visit
Admission: RE | Admit: 2015-05-07 | Discharge: 2015-05-07 | Disposition: A | Discharge status: Home / Self Care | Payer: Medicare Other | Source: Ambulatory Visit | Attending: Radiation Oncology | Admitting: Radiation Oncology

## 2015-05-07 DIAGNOSIS — C541 Malignant neoplasm of endometrium: Secondary | ICD-10-CM | POA: Diagnosis not present

## 2015-05-13 ENCOUNTER — Ambulatory Visit
Admission: RE | Admit: 2015-05-13 | Discharge: 2015-05-13 | Disposition: A | Discharge status: Home / Self Care | Payer: Medicare Other | Source: Ambulatory Visit | Attending: Radiation Oncology | Admitting: Radiation Oncology

## 2015-05-13 DIAGNOSIS — C541 Malignant neoplasm of endometrium: Secondary | ICD-10-CM | POA: Diagnosis not present

## 2015-05-15 ENCOUNTER — Other Ambulatory Visit: Payer: Self-pay | Admitting: *Deleted

## 2015-05-15 DIAGNOSIS — C541 Malignant neoplasm of endometrium: Secondary | ICD-10-CM

## 2015-05-18 DIAGNOSIS — C541 Malignant neoplasm of endometrium: Secondary | ICD-10-CM | POA: Diagnosis not present

## 2015-05-19 DIAGNOSIS — C541 Malignant neoplasm of endometrium: Secondary | ICD-10-CM | POA: Diagnosis not present

## 2015-05-20 ENCOUNTER — Ambulatory Visit
Admission: RE | Admit: 2015-05-20 | Discharge: 2015-05-20 | Disposition: A | Discharge status: Home / Self Care | Payer: Medicare Other | Source: Ambulatory Visit | Attending: Radiation Oncology | Admitting: Radiation Oncology

## 2015-05-21 ENCOUNTER — Ambulatory Visit
Admission: RE | Admit: 2015-05-21 | Discharge: 2015-05-21 | Disposition: A | Discharge status: Home / Self Care | Payer: Medicare Other | Source: Ambulatory Visit | Attending: Radiation Oncology | Admitting: Radiation Oncology

## 2015-05-21 DIAGNOSIS — C541 Malignant neoplasm of endometrium: Secondary | ICD-10-CM | POA: Diagnosis not present

## 2015-05-22 ENCOUNTER — Ambulatory Visit
Admission: RE | Admit: 2015-05-22 | Discharge: 2015-05-22 | Disposition: A | Discharge status: Home / Self Care | Payer: Medicare Other | Source: Ambulatory Visit | Attending: Radiation Oncology | Admitting: Radiation Oncology

## 2015-05-22 ENCOUNTER — Ambulatory Visit: Payer: Medicare Other

## 2015-05-22 DIAGNOSIS — C541 Malignant neoplasm of endometrium: Secondary | ICD-10-CM | POA: Diagnosis not present

## 2015-05-25 ENCOUNTER — Ambulatory Visit: Payer: Medicare Other

## 2015-05-25 ENCOUNTER — Ambulatory Visit: Admission: RE | Admit: 2015-05-25 | Payer: Medicare Other | Source: Ambulatory Visit

## 2015-05-26 ENCOUNTER — Ambulatory Visit
Admission: RE | Admit: 2015-05-26 | Discharge: 2015-05-26 | Disposition: A | Discharge status: Home / Self Care | Payer: Medicare Other | Source: Ambulatory Visit | Attending: Radiation Oncology | Admitting: Radiation Oncology

## 2015-05-26 ENCOUNTER — Ambulatory Visit: Payer: Medicare Other

## 2015-05-26 DIAGNOSIS — C541 Malignant neoplasm of endometrium: Secondary | ICD-10-CM | POA: Diagnosis not present

## 2015-05-27 ENCOUNTER — Ambulatory Visit
Admission: RE | Admit: 2015-05-27 | Discharge: 2015-05-27 | Disposition: A | Discharge status: Home / Self Care | Payer: Medicare Other | Source: Ambulatory Visit | Attending: Radiation Oncology | Admitting: Radiation Oncology

## 2015-05-27 ENCOUNTER — Ambulatory Visit: Payer: Medicare Other

## 2015-05-27 DIAGNOSIS — C541 Malignant neoplasm of endometrium: Secondary | ICD-10-CM | POA: Diagnosis not present

## 2015-05-28 ENCOUNTER — Inpatient Hospital Stay: Payer: Medicare Other | Attending: Radiation Oncology

## 2015-05-28 ENCOUNTER — Ambulatory Visit: Payer: Medicare Other

## 2015-05-28 ENCOUNTER — Ambulatory Visit
Admission: RE | Admit: 2015-05-28 | Discharge: 2015-05-28 | Disposition: A | Discharge status: Home / Self Care | Payer: Medicare Other | Source: Ambulatory Visit | Attending: Radiation Oncology | Admitting: Radiation Oncology

## 2015-05-28 DIAGNOSIS — C541 Malignant neoplasm of endometrium: Secondary | ICD-10-CM | POA: Diagnosis not present

## 2015-05-29 ENCOUNTER — Ambulatory Visit
Admission: RE | Admit: 2015-05-29 | Discharge: 2015-05-29 | Disposition: A | Discharge status: Home / Self Care | Payer: Medicare Other | Source: Ambulatory Visit | Attending: Radiation Oncology | Admitting: Radiation Oncology

## 2015-05-29 ENCOUNTER — Ambulatory Visit: Payer: Medicare Other

## 2015-05-29 DIAGNOSIS — C541 Malignant neoplasm of endometrium: Secondary | ICD-10-CM | POA: Diagnosis not present

## 2015-06-01 ENCOUNTER — Ambulatory Visit
Admission: RE | Admit: 2015-06-01 | Discharge: 2015-06-01 | Disposition: A | Discharge status: Home / Self Care | Payer: Medicare Other | Source: Ambulatory Visit | Attending: Radiation Oncology | Admitting: Radiation Oncology

## 2015-06-01 ENCOUNTER — Ambulatory Visit: Payer: Medicare Other

## 2015-06-01 DIAGNOSIS — C541 Malignant neoplasm of endometrium: Secondary | ICD-10-CM | POA: Diagnosis not present

## 2015-06-02 ENCOUNTER — Ambulatory Visit: Payer: Medicare Other

## 2015-06-02 ENCOUNTER — Ambulatory Visit
Admission: RE | Admit: 2015-06-02 | Discharge: 2015-06-02 | Disposition: A | Discharge status: Home / Self Care | Payer: Medicare Other | Source: Ambulatory Visit | Attending: Radiation Oncology | Admitting: Radiation Oncology

## 2015-06-02 DIAGNOSIS — C541 Malignant neoplasm of endometrium: Secondary | ICD-10-CM | POA: Diagnosis not present

## 2015-06-03 ENCOUNTER — Ambulatory Visit: Payer: Medicare Other

## 2015-06-03 ENCOUNTER — Ambulatory Visit
Admission: RE | Admit: 2015-06-03 | Discharge: 2015-06-03 | Disposition: A | Discharge status: Home / Self Care | Payer: Medicare Other | Source: Ambulatory Visit | Attending: Radiation Oncology | Admitting: Radiation Oncology

## 2015-06-03 DIAGNOSIS — C541 Malignant neoplasm of endometrium: Secondary | ICD-10-CM | POA: Diagnosis not present

## 2015-06-04 ENCOUNTER — Ambulatory Visit
Admission: RE | Admit: 2015-06-04 | Discharge: 2015-06-04 | Disposition: A | Discharge status: Home / Self Care | Payer: Medicare Other | Source: Ambulatory Visit | Attending: Radiation Oncology | Admitting: Radiation Oncology

## 2015-06-04 ENCOUNTER — Ambulatory Visit: Payer: Medicare Other

## 2015-06-04 DIAGNOSIS — C541 Malignant neoplasm of endometrium: Secondary | ICD-10-CM | POA: Diagnosis not present

## 2015-06-05 ENCOUNTER — Ambulatory Visit
Admission: RE | Admit: 2015-06-05 | Discharge: 2015-06-05 | Disposition: A | Discharge status: Home / Self Care | Payer: Medicare Other | Source: Ambulatory Visit | Attending: Radiation Oncology | Admitting: Radiation Oncology

## 2015-06-05 DIAGNOSIS — C541 Malignant neoplasm of endometrium: Secondary | ICD-10-CM | POA: Diagnosis not present

## 2015-07-09 ENCOUNTER — Ambulatory Visit
Admission: RE | Admit: 2015-07-09 | Discharge: 2015-07-09 | Disposition: A | Discharge status: Home / Self Care | Payer: Medicare Other | Source: Ambulatory Visit | Attending: Radiation Oncology | Admitting: Radiation Oncology

## 2015-07-09 ENCOUNTER — Encounter: Payer: Self-pay | Admitting: Radiation Oncology

## 2015-07-09 VITALS — BP 137/79 | HR 66 | Temp 96.9°F | Resp 18 | Wt 128.0 lb

## 2015-07-09 DIAGNOSIS — C541 Malignant neoplasm of endometrium: Secondary | ICD-10-CM

## 2015-07-09 NOTE — Progress Notes (Signed)
Radiation Oncology Follow up Note  Name: Sara Heath   Date:   07/09/2015 MRN:  [id number] DOB: [birth year]    This 82 y.o. female presents to the clinic today for follow-up for locally advanced endometrial cancer.  REFERRING PROVIDER: Petra Kuba, MD  HPI: Patient is a 82 year old female now out 1 month having completed external beam radiation therapy for 10.5 cm poorly differentiated endometrioid adenocarcinoma. Based on her age and comorbidities was not a surgical candidate. She was seen at [facility] for evaluation of brachytherapy although that was declined we treated her to her large field pelvis plus external beam boost. She is seen today in routine follow-up and is doing well. Patient has dementia according to her daughters is having no bleeding no problems with lower urinary tract symptoms or diarrhea..  COMPLICATIONS OF TREATMENT: none  FOLLOW UP COMPLIANCE: keeps appointments   PHYSICAL EXAM:  BP 137/79 mmHg  Pulse 66  Temp(Src) 96.9 F (36.1 C)  Resp 18  Wt 128 lb (58.06 kg) On pelvic examination vaginal vault is clear. No evidence of mass or nodularity in the parametrial region is noted on bimanual examination. Vaginal mucosa is clear. No evidence of bleeding is noted. Well-developed well-nourished patient in NAD. HEENT reveals PERLA, EOMI, discs not visualized.  Oral cavity is clear. No oral mucosal lesions are identified. Neck is clear without evidence of cervical or supraclavicular adenopathy. Lungs are clear to A&P. Cardiac examination is essentially unremarkable with regular rate and rhythm without murmur rub or thrill. Abdomen is benign with no organomegaly or masses noted. Motor sensory and DTR levels are equal and symmetric in the upper and lower extremities. Cranial nerves II through XII are grossly intact. Proprioception is intact. No peripheral adenopathy or edema is identified. No motor or sensory levels are noted. Crude visual fields are within normal  range.  RADIOLOGY RESULTS: No current films for review  PLAN: Present time patient is doing well 1 month out from external beam radiation therapy for locally advanced endometrial cancer. I am please were overall progress. I have asked to see her back in 3-4 months for follow-up. I'm scheduling a follow-up appointment with GYN oncology in our clinic for the next several weeks. Patient's family understands our recommendations. They know to call with any concerns.  I would like to take this opportunity for allowing me to participate in the care of your patient.Armstead Peaks., MD

## 2015-08-05 ENCOUNTER — Inpatient Hospital Stay: Payer: Medicare Other | Attending: Obstetrics and Gynecology | Admitting: Obstetrics and Gynecology

## 2015-08-05 ENCOUNTER — Encounter: Payer: Self-pay | Admitting: Obstetrics and Gynecology

## 2015-08-05 VITALS — BP 109/72 | HR 67 | Temp 96.8°F | Resp 20 | Wt 132.0 lb

## 2015-08-05 DIAGNOSIS — Z7982 Long term (current) use of aspirin: Secondary | ICD-10-CM | POA: Diagnosis not present

## 2015-08-05 DIAGNOSIS — F0390 Unspecified dementia without behavioral disturbance: Secondary | ICD-10-CM | POA: Insufficient documentation

## 2015-08-05 DIAGNOSIS — G8929 Other chronic pain: Secondary | ICD-10-CM | POA: Diagnosis not present

## 2015-08-05 DIAGNOSIS — R54 Age-related physical debility: Secondary | ICD-10-CM | POA: Diagnosis not present

## 2015-08-05 DIAGNOSIS — I2510 Atherosclerotic heart disease of native coronary artery without angina pectoris: Secondary | ICD-10-CM | POA: Insufficient documentation

## 2015-08-05 DIAGNOSIS — G4730 Sleep apnea, unspecified: Secondary | ICD-10-CM | POA: Insufficient documentation

## 2015-08-05 DIAGNOSIS — R590 Localized enlarged lymph nodes: Secondary | ICD-10-CM | POA: Diagnosis not present

## 2015-08-05 DIAGNOSIS — Z955 Presence of coronary angioplasty implant and graft: Secondary | ICD-10-CM | POA: Insufficient documentation

## 2015-08-05 DIAGNOSIS — C541 Malignant neoplasm of endometrium: Secondary | ICD-10-CM | POA: Diagnosis not present

## 2015-08-05 DIAGNOSIS — Z923 Personal history of irradiation: Secondary | ICD-10-CM | POA: Diagnosis not present

## 2015-08-05 DIAGNOSIS — Z79899 Other long term (current) drug therapy: Secondary | ICD-10-CM | POA: Diagnosis not present

## 2015-08-05 DIAGNOSIS — I10 Essential (primary) hypertension: Secondary | ICD-10-CM

## 2015-08-05 DIAGNOSIS — J45909 Unspecified asthma, uncomplicated: Secondary | ICD-10-CM | POA: Diagnosis not present

## 2015-08-05 NOTE — Progress Notes (Signed)
Gynecologic Oncology Consult Visit   Referring Heath: Dr. Laverta Baltimore  Chief Concern: endometrial cancer  Subjective:  Sara Heath is a 82 y.o. female who is seen in consultation from Dr. Ouida Sills for poorly differentiated endometrial cancer. Of note Sara Heath has dementia and is not capable of making her own decisions. She does not have HCPOA, but she does have a very supportive family.   Presents today for surveillance after completing radiation therapy. She received external beam radiation therapy 05/2015. She was seen at [facility] for consideration of brachytherapy and that was declined.   She has no complaints of vaginal bleeding.     Gynecologic Oncology  Sara Heath presented with abnormal vaginal bleeding. She had a pelvic ultrasound on 12/07/2014 that revealed the following:   Measurements: Uterus 10.5 x 3.9 x 6.7 cm. Multiple calcifications are noted throughout the uterus. These are similar to that seen on a prior CT examination. These are not felt to represent uterine fibroids but more likely parenchymal calcification.  Endometrium  Thickness: 7.7 mm.  Right ovary Not well visualized Left ovary Measurements: 1.8 x 0.8 x 1.3 cm.   EMBx poorly differentiated endometrioid adenocarcinoma  12/22/2014  CXR: No acute cardiopulmonary disease. CT A/P: IMPRESSION: 1. Similar to prior CT scan there is a large volume of sludge and perhaps numerous small calculi within the gallbladder. There is mild but progressive dilatation of the extrahepatic and intrahepatic bile ducts as well as of the pancreatic duct. Although there is no pancreatic head mass, consider MRCP to further investigate. 2. Small adrenal nodules too small to characterize. Adrenal metastasis would be an unusual manifestation that endometrial cancer, but the possibility is not excluded. 3. Air within the endometrial canal presumably due to recent instrumentation 4. 8 mm retroperitoneal periaortic  lymph node. Other smaller pelvic lymph nodes as described above. 5. Staghorn calculus left kidney   I previously contacted Ms. Sara Heath, Sara Heath [phone], regarding her CXR and CT scan. There is no definitive evidence of cancer. On CT scan she did have a 8 mm PA node which is not grossly enlarged. I recommended a cardiology consult for clearance and she saw Dr. Venetia Maxon at [facility]. She was seen by Dr. Alycia Rossetti on 02/13/2015. I spoke with him personally. She has a 5% risk of MI and at least a 5% risk of cardiac failure. No further testing was done as he did not feel it would offer further valuable information. We recommended radiation therapy.     Past Medical History: Past Medical History  Diagnosis Date  . Hypertension   . Dementia   . Endometrial cancer ([place])   . Chronic headache   . Vulvar dystrophy   . Gout   . Angioedema   . UTI (lower urinary tract infection)   . Breast mass, left   . Malaise and fatigue   . Sleep apnea   . Chronic back pain   . Weight loss   . Edema of both legs   . Torticollis   . Hematuria   . Vaginal candidiasis   . CAD (coronary artery disease)   . Hemorrhagic cystitis   . Dementia   . Asthma     Past Surgical History: Past Surgical History  Procedure Laterality Date  . Tubal ligation    . Left breast biopsy    . Coronary angioplasty with stent placement      Past Gynecologic History:  See HPI  OB History:  OB History  Gravida Para  Term Preterm AB SAB TAB Ectopic Multiple Living  11 8            Family History: No family history on file.  Social History: Social History   Social History  . Marital Status: Widowed    Spouse Name: N/A  . Number of Children: N/A  . Years of Education: N/A   Occupational History  . Not on file.   Social History Main Topics  . Smoking status: Never Smoker   . Smokeless tobacco: Never Used  . Alcohol Use: No  . Drug Use: Not on file  . Sexual Activity: Not on file    Other Topics Concern  . Not on file   Social History Narrative    Allergies: Allergies  Allergen Reactions  . Penicillins Other (See Comments)    Current Medications: Current Outpatient Prescriptions  Medication Sig Dispense Refill  . aspirin EC 81 MG tablet Take by mouth.    . cetirizine (ZYRTEC) 10 MG tablet     . colchicine 0.6 MG tablet take 1 tablet by mouth once daily for PREVENT GOUT take 2 tablets...  (REFER TO PRESCRIPTION NOTES).  0  . docusate sodium (COLACE) 100 MG capsule Take by mouth.    . furosemide (LASIX) 40 MG tablet Take by mouth.    . indomethacin (INDOCIN) 25 MG capsule take 1 capsule by mouth three times a day for 5 to 7 DAYS.  0  . isosorbide mononitrate (IMDUR) 120 MG 24 hr tablet Take by mouth.    . metoprolol succinate (TOPROL-XL) 50 MG 24 hr tablet     . nitroGLYCERIN (NITROSTAT) 0.4 MG SL tablet Place under the tongue.    Marland Kitchen RA ANTACID/ANTI-GAS MAX ST 400-400-40 MG/5ML suspension take 10 to 20 milliliters by mouth UP TO twice a day for CHEST PAIN AFTER EATING  0  . RA COL-RITE 100 MG capsule take 1 capsule by mouth twice a day if needed for STOOL SOFTENING  0  . simvastatin (ZOCOR) 20 MG tablet      No current facility-administered medications for this visit.    Review of Systems   General: weight loss, weakness  HEENT: no complaints  Lungs: cough  Cardiac: negative  GI: negative  GU: negative other than vulvar itching  Musculoskeletal: no complaints  Extremities: negative  Skin: negative  Neuro: negative  Endocrine: no complaints  Psych: no complaints Allergy: allergy issues       Objective:  Physical Examination:  BP 109/72 mmHg  Pulse 67  Temp(Src) 96.8 F (36 C) (Tympanic)  Resp 20  Wt 132 lb (59.875 kg)     ECOG Performance Status: 3 - Symptomatic, >50% confined to bed or chair  General appearance: appears stated age and no distress, cooperative but non interactive HEENT:PERRLA and sclera clear, anicteric  Lymph  node survey: Firm fixed 1 cm lef t cervical lymph node; no obvious supraclavicular, axillary or inguinal nodes. CV: RRR Lungs: CTA Abdomen: soft, non-tender, without masses or organomegaly and no hernias and no ascites.  Back: inspection of back is normal Extremities: edema bilateral and symmetrical. Per patient family she has chronic edema  Skin: pigmentation changes in radiation field.  Neurological exam reveals Patient follows commands and shook my hand; she did not engage in the conversation.  Pelvic: exam chaperoned by nurse;  Vulva: normal appearing vulva with no masses, tenderness or lesions; Vagina: normal vagina; Adnexa: nontender and no masses; Uterus: unable to determine size, not grossly enlarged and nontender; Cervix: unable  to visualize fully. Deviated posteriorly. No abnormalities on palpation. Parametria was smooth bilaterally; Rectal: not indicated    Lab Review N/A   Radiologic Imaging: PET ordered    Assessment:  Sara Heath is a 82 y.o. female diagnosed with grade 3 endometrial cancer, non-operable candidate, s/p external beam radiation therapy. Enlarged firm cervical node on exam. Medical co-morbidities complicating care: Dementia, frailty, and CAD. Plan:      Problem List Items Addressed This Visit    Endometrial cancer        Right cervical adenopathy  Visit Diagnoses    Endometrial cancer    -  Primary    Dementia       I discussed the exam findings with the patient and her daughter. The cervical mass could represent a lymph node or even tracheal cartilage. I have recommended PET scan for further evaluation. I explained that it is too early to do imaging to assess disease status after radiation, but that this is being done for the exam findings noted. If negative she will follow up with Dr. Baruch Gouty in 3 months and return to our clinic in 6 months. The patient and her daughter agree with this plan.    Gillis Ends, MD     CC:  Dr.  Laverta Baltimore

## 2015-08-05 NOTE — Progress Notes (Signed)
Patient here today for follow up regarding endometrial cancer. Patient and family deny concerns today.

## 2015-09-06 ENCOUNTER — Emergency Department: Payer: Medicare Other

## 2015-09-06 ENCOUNTER — Observation Stay
Admission: EM | Admit: 2015-09-06 | Discharge: 2015-09-08 | Disposition: A | Discharge status: Home / Self Care | Payer: Medicare Other | Attending: Internal Medicine | Admitting: Internal Medicine

## 2015-09-06 ENCOUNTER — Encounter: Payer: Self-pay | Admitting: *Deleted

## 2015-09-06 DIAGNOSIS — Z7982 Long term (current) use of aspirin: Secondary | ICD-10-CM | POA: Insufficient documentation

## 2015-09-06 DIAGNOSIS — Z1619 Resistance to other specified beta lactam antibiotics: Secondary | ICD-10-CM | POA: Diagnosis not present

## 2015-09-06 DIAGNOSIS — I2510 Atherosclerotic heart disease of native coronary artery without angina pectoris: Secondary | ICD-10-CM

## 2015-09-06 DIAGNOSIS — N3001 Acute cystitis with hematuria: Secondary | ICD-10-CM | POA: Diagnosis not present

## 2015-09-06 DIAGNOSIS — F0390 Unspecified dementia without behavioral disturbance: Secondary | ICD-10-CM

## 2015-09-06 DIAGNOSIS — R531 Weakness: Secondary | ICD-10-CM

## 2015-09-06 DIAGNOSIS — M549 Dorsalgia, unspecified: Secondary | ICD-10-CM | POA: Diagnosis not present

## 2015-09-06 DIAGNOSIS — Z955 Presence of coronary angioplasty implant and graft: Secondary | ICD-10-CM | POA: Diagnosis not present

## 2015-09-06 DIAGNOSIS — I6523 Occlusion and stenosis of bilateral carotid arteries: Secondary | ICD-10-CM | POA: Insufficient documentation

## 2015-09-06 DIAGNOSIS — J45909 Unspecified asthma, uncomplicated: Secondary | ICD-10-CM | POA: Diagnosis not present

## 2015-09-06 DIAGNOSIS — G8929 Other chronic pain: Secondary | ICD-10-CM | POA: Insufficient documentation

## 2015-09-06 DIAGNOSIS — Z6824 Body mass index (BMI) 24.0-24.9, adult: Secondary | ICD-10-CM | POA: Insufficient documentation

## 2015-09-06 DIAGNOSIS — G4730 Sleep apnea, unspecified: Secondary | ICD-10-CM | POA: Diagnosis not present

## 2015-09-06 DIAGNOSIS — R55 Syncope and collapse: Principal | ICD-10-CM

## 2015-09-06 DIAGNOSIS — I071 Rheumatic tricuspid insufficiency: Secondary | ICD-10-CM | POA: Diagnosis not present

## 2015-09-06 DIAGNOSIS — G4733 Obstructive sleep apnea (adult) (pediatric): Secondary | ICD-10-CM | POA: Diagnosis not present

## 2015-09-06 DIAGNOSIS — E46 Unspecified protein-calorie malnutrition: Secondary | ICD-10-CM | POA: Insufficient documentation

## 2015-09-06 DIAGNOSIS — M109 Gout, unspecified: Secondary | ICD-10-CM | POA: Diagnosis not present

## 2015-09-06 DIAGNOSIS — I469 Cardiac arrest, cause unspecified: Secondary | ICD-10-CM | POA: Diagnosis present

## 2015-09-06 DIAGNOSIS — Z8542 Personal history of malignant neoplasm of other parts of uterus: Secondary | ICD-10-CM

## 2015-09-06 DIAGNOSIS — Z880 Allergy status to penicillin: Secondary | ICD-10-CM | POA: Diagnosis not present

## 2015-09-06 DIAGNOSIS — B9689 Other specified bacterial agents as the cause of diseases classified elsewhere: Secondary | ICD-10-CM | POA: Diagnosis not present

## 2015-09-06 DIAGNOSIS — Z79899 Other long term (current) drug therapy: Secondary | ICD-10-CM | POA: Diagnosis not present

## 2015-09-06 DIAGNOSIS — R51 Headache: Secondary | ICD-10-CM | POA: Diagnosis not present

## 2015-09-06 DIAGNOSIS — D61818 Other pancytopenia: Secondary | ICD-10-CM | POA: Diagnosis not present

## 2015-09-06 DIAGNOSIS — I252 Old myocardial infarction: Secondary | ICD-10-CM | POA: Diagnosis not present

## 2015-09-06 DIAGNOSIS — I10 Essential (primary) hypertension: Secondary | ICD-10-CM

## 2015-09-06 DIAGNOSIS — E440 Moderate protein-calorie malnutrition: Secondary | ICD-10-CM

## 2015-09-06 LAB — CBC WITH DIFFERENTIAL/PLATELET
BASOS PCT: 0 %
Basophils Absolute: 0 10*3/uL (ref 0–0.1)
EOS ABS: 0.2 10*3/uL (ref 0–0.7)
EOS PCT: 3 %
HCT: 35.3 % (ref 35.0–47.0)
HEMOGLOBIN: 11.5 g/dL — AB (ref 12.0–16.0)
LYMPHS ABS: 0.6 10*3/uL — AB (ref 1.0–3.6)
Lymphocytes Relative: 12 %
MCH: 31.5 pg (ref 26.0–34.0)
MCHC: 32.7 g/dL (ref 32.0–36.0)
MCV: 96.3 fL (ref 80.0–100.0)
MONO ABS: 0.5 10*3/uL (ref 0.2–0.9)
MONOS PCT: 11 %
NEUTROS PCT: 74 %
Neutro Abs: 3.7 10*3/uL (ref 1.4–6.5)
Platelets: 159 10*3/uL (ref 150–440)
RBC: 3.66 MIL/uL — ABNORMAL LOW (ref 3.80–5.20)
RDW: 15.8 % — AB (ref 11.5–14.5)
WBC: 5 10*3/uL (ref 3.6–11.0)

## 2015-09-06 LAB — BASIC METABOLIC PANEL
Anion gap: 3 — ABNORMAL LOW (ref 5–15)
BUN: 13 mg/dL (ref 6–20)
CALCIUM: 9.6 mg/dL (ref 8.9–10.3)
CHLORIDE: 108 mmol/L (ref 101–111)
CO2: 29 mmol/L (ref 22–32)
CREATININE: 0.75 mg/dL (ref 0.44–1.00)
GFR calc Af Amer: >60 mL/min (ref >60)
GFR calc non Af Amer: >60 mL/min (ref >60)
Glucose, Bld: 104 mg/dL — ABNORMAL HIGH (ref 65–99)
Potassium: 3.9 mmol/L (ref 3.5–5.1)
SODIUM: 140 mmol/L (ref 135–145)

## 2015-09-06 LAB — URINALYSIS COMPLETE WITH MICROSCOPIC (ARMC ONLY)
Bilirubin Urine: NEGATIVE
Glucose, UA: NEGATIVE mg/dL
Nitrite: NEGATIVE
PROTEIN: 30 mg/dL — AB
SPECIFIC GRAVITY, URINE: 1.011 (ref 1.005–1.030)
pH: 6 (ref 5.0–8.0)

## 2015-09-06 LAB — TROPONIN I

## 2015-09-06 MED ORDER — SODIUM CHLORIDE 0.9% FLUSH
3.0000 mL | Freq: Two times a day (BID) | INTRAVENOUS | Status: DC
  Administered 2015-09-06 – 2015-09-08 (×4): 3 mL via INTRAVENOUS

## 2015-09-06 MED ORDER — METOPROLOL SUCCINATE ER 50 MG PO TB24
50.0000 mg | ORAL_TABLET | Freq: Every day | ORAL | Status: DC
  Administered 2015-09-07 – 2015-09-08 (×2): 50 mg via ORAL
  Filled 2015-09-06 (×2): qty 1

## 2015-09-06 MED ORDER — SIMVASTATIN 20 MG PO TABS
20.0000 mg | ORAL_TABLET | Freq: Every day | ORAL | Status: DC
  Administered 2015-09-07 – 2015-09-08 (×2): 20 mg via ORAL
  Filled 2015-09-06 (×2): qty 1

## 2015-09-06 MED ORDER — SODIUM CHLORIDE 0.9 % IV BOLUS (SEPSIS)
1000.0000 mL | Freq: Once | INTRAVENOUS | Status: AC
  Administered 2015-09-06: 1000 mL via INTRAVENOUS

## 2015-09-06 MED ORDER — ISOSORBIDE MONONITRATE ER 60 MG PO TB24
120.0000 mg | ORAL_TABLET | Freq: Every day | ORAL | Status: DC
  Administered 2015-09-07 – 2015-09-08 (×2): 120 mg via ORAL
  Filled 2015-09-06 (×2): qty 2

## 2015-09-06 MED ORDER — NITROGLYCERIN 0.4 MG SL SUBL: 0.4000 mg | SUBLINGUAL_TABLET | SUBLINGUAL | Status: DC | PRN

## 2015-09-06 MED ORDER — ASPIRIN EC 81 MG PO TBEC
81.0000 mg | DELAYED_RELEASE_TABLET | Freq: Every day | ORAL | Status: DC
  Administered 2015-09-07 – 2015-09-08 (×2): 81 mg via ORAL
  Filled 2015-09-06 (×2): qty 1

## 2015-09-06 MED ORDER — HEPARIN SODIUM (PORCINE) 5000 UNIT/ML IJ SOLN
5000.0000 [IU] | Freq: Three times a day (TID) | INTRAMUSCULAR | Status: DC
  Administered 2015-09-06 – 2015-09-08 (×5): 5000 [IU] via SUBCUTANEOUS
  Filled 2015-09-06 (×7): qty 1

## 2015-09-06 MED ORDER — DOCUSATE SODIUM 100 MG PO CAPS
100.0000 mg | ORAL_CAPSULE | Freq: Two times a day (BID) | ORAL | Status: DC
  Administered 2015-09-07 – 2015-09-08 (×3): 100 mg via ORAL
  Filled 2015-09-06 (×3): qty 1

## 2015-09-06 MED ORDER — ONDANSETRON HCL 4 MG PO TABS: 4.0000 mg | ORAL_TABLET | Freq: Four times a day (QID) | ORAL | Status: DC | PRN

## 2015-09-06 MED ORDER — ACETAMINOPHEN 650 MG RE SUPP: 650.0000 mg | Freq: Four times a day (QID) | RECTAL | Status: DC | PRN

## 2015-09-06 MED ORDER — BISACODYL 10 MG RE SUPP: 10.0000 mg | Freq: Every day | RECTAL | Status: DC | PRN

## 2015-09-06 MED ORDER — ONDANSETRON HCL 4 MG/2ML IJ SOLN
4.0000 mg | Freq: Four times a day (QID) | INTRAMUSCULAR | Status: DC | PRN
  Filled 2015-09-06: qty 2

## 2015-09-06 MED ORDER — HYDROCODONE-ACETAMINOPHEN 5-325 MG PO TABS
1.0000 | ORAL_TABLET | ORAL | Status: DC | PRN
  Administered 2015-09-07: 1 via ORAL
  Filled 2015-09-06: qty 1

## 2015-09-06 MED ORDER — SODIUM CHLORIDE 0.9 % IV SOLN
INTRAVENOUS | Status: DC
  Administered 2015-09-06 – 2015-09-07 (×2): via INTRAVENOUS

## 2015-09-06 MED ORDER — FUROSEMIDE 40 MG PO TABS
40.0000 mg | ORAL_TABLET | ORAL | Status: DC
  Administered 2015-09-07: 40 mg via ORAL
  Filled 2015-09-06: qty 1

## 2015-09-06 MED ORDER — ACETAMINOPHEN 325 MG PO TABS
650.0000 mg | ORAL_TABLET | Freq: Four times a day (QID) | ORAL | Status: DC | PRN
  Administered 2015-09-08: 650 mg via ORAL
  Filled 2015-09-06: qty 2

## 2015-09-06 NOTE — ED Provider Notes (Signed)
[facility] Emergency Department Provider Note   ____________________________________________  Time seen: Seen upon arrival to the emergency department  I have reviewed the triage vital signs and the nursing notes.   HISTORY  Chief Complaint Cardiac Arrest   HPI Sara Heath is a 82 y.o. female who waswalking outside at a barbecue when she collapsed. She was unresponsive for an unknown period of time. 911 was called and when fire rescue arrived she was found to have a non-shockable rhythm. She soon afterward became awake and answering simple questions. She was then transported to the hospital. The patient has no complaints at this time. Denies any pain. However, does not remember the events preceding her arrival. She is able to say her name and where she has but does not know her birth date. At the scene and CPR was done and 2 rounds of epi were given with return of spontaneous circulation.   Past Medical History  Diagnosis Date  . Hypertension   . Dementia   . Endometrial cancer ([place])   . Chronic headache   . Vulvar dystrophy   . Gout   . Angioedema   . UTI (lower urinary tract infection)   . Breast mass, left   . Malaise and fatigue   . Sleep apnea   . Chronic back pain   . Weight loss   . Edema of both legs   . Torticollis   . Hematuria   . Vaginal candidiasis   . CAD (coronary artery disease)   . Hemorrhagic cystitis   . Dementia   . Asthma     There are no active problems to display for this patient.   Past Surgical History  Procedure Laterality Date  . Tubal ligation    . Left breast biopsy    . Coronary angioplasty with stent placement      Current Outpatient Rx  Name  Route  Sig  Dispense  Refill  . aspirin EC 81 MG tablet   Oral   Take 81 mg by mouth daily.          . cetirizine (ZYRTEC) 10 MG tablet   Oral   Take 10 mg by mouth daily as needed for allergies.          Marland Kitchen colchicine 0.6 MG tablet      take 1  tablet by mouth once daily for PREVENT GOUT take 2 tablets...  (REFER TO PRESCRIPTION NOTES).      0   . furosemide (LASIX) 40 MG tablet   Oral   Take 40 mg by mouth every other day.          . isosorbide mononitrate (IMDUR) 120 MG 24 hr tablet   Oral   Take 120 mg by mouth daily.          . metoprolol succinate (TOPROL-XL) 50 MG 24 hr tablet   Oral   Take 50 mg by mouth daily.          . nitroGLYCERIN (NITROSTAT) 0.4 MG SL tablet   Sublingual   Place 0.4 mg under the tongue every 5 (five) minutes x 3 doses as needed for chest pain. *if not resolved , go to emergency department.*         . RA ANTACID/ANTI-GAS MAX ST 400-400-40 MG/5ML suspension      take 10 to 20 milliliters by mouth UP TO twice a day for CHEST PAIN AFTER EATING      0  Dispense as written.   Marland Kitchen RA COL-RITE 100 MG capsule      take 100 mg by mouth twice a day if needed for STOOL SOFTENING      0     Dispense as written.   . simvastatin (ZOCOR) 20 MG tablet   Oral   Take 20 mg by mouth daily.            Allergies Penicillins  History reviewed. No pertinent family history.  Social History Social History  Substance Use Topics  . Smoking status: Never Smoker   . Smokeless tobacco: Never Used  . Alcohol Use: No    Review of Systems Constitutional: No fever/chills Eyes: No visual changes. ENT: No sore throat. Cardiovascular: Denies chest pain. Respiratory: Denies shortness of breath. Gastrointestinal: No abdominal pain.  No nausea, no vomiting.  No diarrhea.  No constipation. Genitourinary: Negative for dysuria. Musculoskeletal: Negative for back pain. Skin: Negative for rash. Neurological: Negative for headaches, focal weakness or numbness.  10-point ROS otherwise negative.  ____________________________________________   PHYSICAL EXAM:  VITAL SIGNS: ED Triage Vitals  Enc Vitals Group     BP 09/06/15 1818 144/89 mmHg     Pulse Rate 09/06/15 1818 62     Resp 09/06/15  1818 31     Temp 09/06/15 1818 97.3 F (36.3 C)     Temp Source 09/06/15 1818 Axillary     SpO2 09/06/15 1818 98 %     Weight 09/06/15 1818 138 lb 14.2 oz (63 kg)     Height 09/06/15 1818 5\' 2"  (1.575 m)     Head Cir --      Peak Flow --      Pain Score --      Pain Loc --      Pain Edu? --      Excl. in [place]? --     Constitutional: Alert and oriented. Well appearing and in no acute distress. Eyes: Conjunctivae are normal. PERRL. EOMI. Head: Atraumatic. Nose: No congestion/rhinnorhea. Mouth/Throat: Mucous membranes are moist.   Neck: No stridor.   Cardiovascular: Normal rate, regular rhythm. Grossly normal heart sounds.  Good peripheral circulation. Tenderness palpation over the left chest. No crepitus. Respiratory: Normal respiratory effort.  No retractions. Lungs CTAB. Gastrointestinal: Soft and nontender. No distention. No abdominal bruits. No CVA tenderness. Musculoskeletal: No lower extremity tenderness.  Bilateral lower extremity edema to the calves that EMS says is chronic.  No joint effusions. Neurologic:  Normal speech and language. No gross focal neurologic deficits are appreciated. Skin:  Skin is warm, dry and intact. No rash noted. Psychiatric: Mood and affect are normal. Speech and behavior are normal.  ____________________________________________   LABS (all labs ordered are listed, but only abnormal results are displayed)  Labs Reviewed  CBC WITH DIFFERENTIAL/PLATELET - Abnormal; Notable for the following:    RBC 3.66 (*)    Hemoglobin 11.5 (*)    RDW 15.8 (*)    Lymphs Abs 0.6 (*)    All other components within normal limits  BASIC METABOLIC PANEL - Abnormal; Notable for the following:    Glucose, Bld 104 (*)    Anion gap 3 (*)    All other components within normal limits  TROPONIN I  URINALYSIS COMPLETEWITH MICROSCOPIC ([facility] ONLY)   ____________________________________________  EKG  ED ECG REPORT I, Doran Stabler, the attending physician,  personally viewed and interpreted this ECG.   Date: 09/06/2015  EKG Time: 1813  Rate: 62  Rhythm: normal sinus  rhythm  Axis: Normal axis  Intervals: Short PR interval.  ST&T Change: No ST segment elevation or depression. No abnormal T-wave inversion.  ____________________________________________  RADIOLOGY  CT Head Wo Contrast (Final result) Result time: 09/06/15 19:36:09   Final result by Rad Results In Interface (09/06/15 19:36:09)   Narrative:   CLINICAL DATA: Recent syncopal episode  EXAM: CT HEAD WITHOUT CONTRAST  TECHNIQUE: Contiguous axial images were obtained from the base of the skull through the vertex without intravenous contrast.  COMPARISON: 10/02/2014  FINDINGS: Bony calvarium is intact. Diffuse atrophic changes are again identified. No findings to suggest acute hemorrhage, acute infarction or space-occupying mass lesion are noted. Some scattered areas decreased attenuation are noted in the deep white matter consistent with chronic white matter ischemic change.  IMPRESSION: Atrophy and chronic ischemic change. No acute abnormality is noted.   Electronically Signed By: Inez Catalina M.D. On: 09/06/2015 19:36      Pending chest x-ray ____________________________________________   PROCEDURES   ____________________________________________   INITIAL IMPRESSION / ASSESSMENT AND PLAN / ED COURSE  Pertinent labs & imaging results that were available during my care of the patient were reviewed by me and considered in my medical decision making (see chart for details).  ----------------------------------------- 7:50 PM on 09/06/2015 -----------------------------------------  Patient is awake and alert and at her baseline per the family who is now at her bedside. They stated she was sitting at the barbecue this afternoon when she slowed her head forward. Became unresponsive for about 5 minutes. Faint pulse. CPR was started by EMS. No shocks  were delivered. No seizure activity noted. Inform the family that the lab results as well as the need for observation for cardiac monitoring. They're understanding of this plan 1 to comply. Chest x-ray still pending at this time. The family understands that there may be rib fractures with CPR. We'll be checking a chest x-ray but I did ask pointed and that we are not always able to identify chest x-rays on plain film x-rays. They're understanding willing to comply. They also understand that we will make sure the patient is comfortable and breathing normally and does not require any pain medication for her chest pain. We will give pain medication as appropriate. Discussed case Dr. Doy Hutching of medicine who will be admitting the patient. ____________________________________________   FINAL CLINICAL IMPRESSION(S) / ED DIAGNOSES  Syncope. Cardiac arrest.    NEW MEDICATIONS STARTED DURING THIS VISIT:  New Prescriptions   No medications on file     Note:  This document was prepared using Dragon voice recognition software and may include unintentional dictation errors.    Orbie Pyo, MD 09/06/15 [phone]

## 2015-09-06 NOTE — H&P (Signed)
History and Physical    Sara Heath [id number] DOB: October 28, 1921 DOA: 09/06/2015  Referring physician: Dr. Clearnce Hasten PCP: Petra Kuba, MD  Specialists: none  Chief Complaint: syncope  HPI: Sara Heath is a 82 y.o. female has a past medical history significant for CAD s/p MI, endometrial cancer s/p XRT, and HTN with dementia now with a syncopal episode while outside at a family picnic. Pt is demented and hx comes from family. Apparently was sitting in a chair outside and "slumpred over". EMS was called and CPR was initiated. She received 2 doses of epi in the field and returned to normal. Currently c/o chest pain that occurs with palpation(presumably from CPR). EKG and troponin and CXR OK in ER. She is now admitted.  Review of Systems: The patient denies anorexia, fever, weight loss,, vision loss, decreased hearing, hoarseness, dyspnea on exertion, peripheral edema, balance deficits, hemoptysis, abdominal pain, melena, hematochezia, severe indigestion/heartburn, hematuria, incontinence, genital sores, muscle weakness, suspicious skin lesions, transient blindness, difficulty walking, depression, unusual weight change, abnormal bleeding, enlarged lymph nodes, angioedema, and breast masses.   Past Medical History  Diagnosis Date  . Hypertension   . Dementia   . Endometrial cancer ([place])   . Chronic headache   . Vulvar dystrophy   . Gout   . Angioedema   . UTI (lower urinary tract infection)   . Breast mass, left   . Malaise and fatigue   . Sleep apnea   . Chronic back pain   . Weight loss   . Edema of both legs   . Torticollis   . Hematuria   . Vaginal candidiasis   . CAD (coronary artery disease)   . Hemorrhagic cystitis   . Dementia   . Asthma    Past Surgical History  Procedure Laterality Date  . Tubal ligation    . Left breast biopsy    . Coronary angioplasty with stent placement     Social History:  reports that she has never smoked. She has never used  smokeless tobacco. She reports that she does not drink alcohol. Her drug history is not on file.  Allergies  Allergen Reactions  . Penicillins Anaphylaxis    Has patient had a PCN reaction causing immediate rash, facial/tongue/throat swelling, SOB or lightheadedness with hypotension: Yes Has patient had a PCN reaction causing severe rash involving mucus membranes or skin necrosis: No Has patient had a PCN reaction that required hospitalization No Has patient had a PCN reaction occurring within the last 10 years: No If all of the above answers are "NO", then may proceed with Cephalosporin use.    Family History  Problem Relation Age of Onset  . Family history unknown: Yes    Prior to Admission medications   Medication Sig Start Date End Date Taking? Authorizing Provider  aspirin EC 81 MG tablet Take 81 mg by mouth daily.    Yes Historical Provider, MD  cetirizine (ZYRTEC) 10 MG tablet Take 10 mg by mouth daily as needed for allergies.  11/26/14  Yes Historical Provider, MD  colchicine 0.6 MG tablet take 1 tablet by mouth once daily for PREVENT GOUT take 2 tablets...  (REFER TO PRESCRIPTION NOTES). 07/03/15  Yes Historical Provider, MD  furosemide (LASIX) 40 MG tablet Take 40 mg by mouth every other day.    Yes Historical Provider, MD  isosorbide mononitrate (IMDUR) 120 MG 24 hr tablet Take 120 mg by mouth daily.  11/13/14  Yes Historical Provider, MD  metoprolol succinate (  TOPROL-XL) 50 MG 24 hr tablet Take 50 mg by mouth daily.  11/26/14  Yes Historical Provider, MD  nitroGLYCERIN (NITROSTAT) 0.4 MG SL tablet Place 0.4 mg under the tongue every 5 (five) minutes x 3 doses as needed for chest pain. *if not resolved , go to emergency department.*   Yes Historical Provider, MD  RA ANTACID/ANTI-GAS MAX ST [id number] MG/5ML suspension take 10 to 20 milliliters by mouth UP TO twice a day for CHEST PAIN AFTER EATING 10/01/14  Yes Historical Provider, MD  RA COL-RITE 100 MG capsule take 100 mg by mouth  twice a day if needed for STOOL SOFTENING 10/01/14  Yes Historical Provider, MD  simvastatin (ZOCOR) 20 MG tablet Take 20 mg by mouth daily.  12/05/14  Yes Historical Provider, MD   Physical Exam: Filed Vitals:   09/06/15 1818 09/06/15 1830 09/06/15 1900 09/06/15 1930  BP: 144/89 144/97 127/75 140/82  Pulse: 62 64 63 68  Temp: 97.3 F (36.3 C)     TempSrc: Axillary     Resp: 31 18 16 22   Height: 5\' 2"  (1.575 m)     Weight: 63 kg (138 lb 14.2 oz)     SpO2: 98% 98% 100% 99%     General:  Chronically ill appearing in no apparent distress, [place]/AT, WDWN  Eyes: PERRL, EOMI, no scleral icterus bilateral arcus senilus noted, conjunctiva clear  ENT: moist oropharynx without lesions or exudate, TM's benign, dentures out  Neck: supple, no lymphadenopathy. No bruits or thyromegaly  Cardiovascular: regular rate without MRG; 2+ peripheral pulses, no JVD, 2+ peripheral edema  Respiratory: CTA biL, good air movement without wheezing, rhonchi or crackled, respiratory effort normal  Abdomen: soft, non tender to palpation, positive bowel sounds, no guarding, no rebound  Skin: no rashes or lesions  Musculoskeletal: normal bulk and tone, no joint swelling  Psychiatric: normal mood and affect, alert, oriented to person only  Neurologic: CN 2-12 grossly intact, Motor strength 5/5 in all 4 groups with symmetric DTR's and non-focal sensory exam  Labs on Admission:  Basic Metabolic Panel:  Recent Labs Lab 09/06/15 1813  NA 140  K 3.9  CL 108  CO2 29  GLUCOSE 104*  BUN 13  CREATININE 0.75  CALCIUM 9.6   Liver Function Tests: No results for input(s): AST, ALT, ALKPHOS, BILITOT, PROT, ALBUMIN in the last 168 hours. No results for input(s): LIPASE, AMYLASE in the last 168 hours. No results for input(s): AMMONIA in the last 168 hours. CBC:  Recent Labs Lab 09/06/15 1813  WBC 5.0  NEUTROABS 3.7  HGB 11.5*  HCT 35.3  MCV 96.3  PLT 159   Cardiac Enzymes:  Recent Labs Lab  09/06/15 1813  TROPONINI <0.03    BNP (last 3 results) No results for input(s): BNP in the last 8760 hours.  ProBNP (last 3 results) No results for input(s): PROBNP in the last 8760 hours.  CBG: No results for input(s): GLUCAP in the last 168 hours.  Radiological Exams on Admission: Dg Chest 1 View  09/06/2015  CLINICAL DATA:  Recent cardiac arrest EXAM: CHEST 1 VIEW COMPARISON:  12/22/2014 FINDINGS: Cardiac shadow is stable. The lungs are well aerated bilaterally. No pneumothorax is seen. No acute bony abnormalities noted. IMPRESSION: Stable appearance of the chest from the previous exam. Electronically Signed   By: Inez Catalina M.D.   On: 09/06/2015 19:52   Ct Head Wo Contrast  09/06/2015  CLINICAL DATA:  Recent syncopal episode EXAM: CT HEAD WITHOUT CONTRAST TECHNIQUE: Contiguous  axial images were obtained from the base of the skull through the vertex without intravenous contrast. COMPARISON:  10/02/2014 FINDINGS: Bony calvarium is intact. Diffuse atrophic changes are again identified. No findings to suggest acute hemorrhage, acute infarction or space-occupying mass lesion are noted. Some scattered areas decreased attenuation are noted in the deep white matter consistent with chronic white matter ischemic change. IMPRESSION: Atrophy and chronic ischemic change.  No acute abnormality is noted. Electronically Signed   By: Inez Catalina M.D.   On: 09/06/2015 19:36    EKG: Independently reviewed.  Assessment/Plan Principal Problem:   Syncope Active Problems:   CAD (coronary artery disease)   HTN, goal below 140/80   History of endometrial cancer   Will observe on telemetry and follow enzymes. Order echo and carotids. Consult PT and CM. Repeat labs in AM  Diet: soft Fluids: NS@75  DVT Prophylaxis: SQ Heparin  Code Status: FULL  Family Communication: yes  Disposition Plan: home  Time spent: 50 min

## 2015-09-06 NOTE — Care Management Obs Status (Signed)
[place] NOTIFICATION   Patient Details  Name: Sara Heath MRN: [id number] Date of Birth: July 19, 1921   Medicare Observation Status Notification Given:  Yes    CrutchfieldAntony Haste, RN 09/06/2015, 8:29 PM

## 2015-09-06 NOTE — ED Notes (Signed)
Family verbalized to RN that pt is reporting "I just don't feel right and it hurts to breath." Bilateral radial pulses are equal and strong. No changes noted in HR or rhythm. Pt's BP elevated slightly, RN will monitor. Pts door open and in sight of RN. Pt remains attached to zoll and vitals monitor. Pt remains alert to self and place.

## 2015-09-06 NOTE — ED Notes (Signed)
Pts CBG at bedside is 90. EMS reported CBG to be at 227 at scene. MD made aware of drop. Will recheck in 10 minutes.

## 2015-09-06 NOTE — ED Notes (Signed)
Pt taken to CT.

## 2015-09-06 NOTE — ED Notes (Addendum)
Pt arrived to ED from home after a reported cardiac arrest x 2. EMS reports pt was outside at a barbeque when pt fell to the ground unresponsive. Pt denies memory of event at this time but fire department reports pt was lethargic and "slumped over" upon their arrival to scene. Fire dept reports they performed two rounds of CPR after they felt they had lost pulses. No shock was advised per AED on scene. Pt is A&O x 2 and talking upon arrival to ED. Family reports it is pts baseline to be confused to time. Vitals are WNL. EMS reports pt's bilateral leg swelling is normal at baseline.

## 2015-09-07 ENCOUNTER — Observation Stay (HOSPITAL_BASED_OUTPATIENT_CLINIC_OR_DEPARTMENT_OTHER)
Admit: 2015-09-07 | Discharge: 2015-09-07 | Disposition: A | Discharge status: Home / Self Care | Payer: Medicare Other | Attending: Internal Medicine | Admitting: Internal Medicine

## 2015-09-07 ENCOUNTER — Observation Stay: Payer: Medicare Other

## 2015-09-07 ENCOUNTER — Encounter: Payer: Self-pay | Admitting: Radiology

## 2015-09-07 DIAGNOSIS — R55 Syncope and collapse: Secondary | ICD-10-CM | POA: Diagnosis not present

## 2015-09-07 LAB — TROPONIN I
Troponin I: <0.03 ng/mL (ref <0.031)
Troponin I: <0.03 ng/mL (ref <0.031)

## 2015-09-07 LAB — COMPREHENSIVE METABOLIC PANEL
ALBUMIN: 3.4 g/dL — AB (ref 3.5–5.0)
ALK PHOS: 24 U/L — AB (ref 38–126)
ALT: 9 U/L — AB (ref 14–54)
AST: 18 U/L (ref 15–41)
Anion gap: 6 (ref 5–15)
BILIRUBIN TOTAL: 0.7 mg/dL (ref 0.3–1.2)
BUN: 10 mg/dL (ref 6–20)
CO2: 26 mmol/L (ref 22–32)
Calcium: 9.4 mg/dL (ref 8.9–10.3)
Chloride: 109 mmol/L (ref 101–111)
Creatinine, Ser: 0.69 mg/dL (ref 0.44–1.00)
GFR calc Af Amer: >60 mL/min (ref >60)
GFR calc non Af Amer: >60 mL/min (ref >60)
GLUCOSE: 91 mg/dL (ref 65–99)
Potassium: 4.3 mmol/L (ref 3.5–5.1)
SODIUM: 141 mmol/L (ref 135–145)
TOTAL PROTEIN: 6.2 g/dL — AB (ref 6.5–8.1)

## 2015-09-07 LAB — CBC
HEMATOCRIT: 33.3 % — AB (ref 35.0–47.0)
HEMOGLOBIN: 10.9 g/dL — AB (ref 12.0–16.0)
MCH: 31.3 pg (ref 26.0–34.0)
MCHC: 32.7 g/dL (ref 32.0–36.0)
MCV: 95.9 fL (ref 80.0–100.0)
Platelets: 149 10*3/uL — ABNORMAL LOW (ref 150–440)
RBC: 3.47 MIL/uL — ABNORMAL LOW (ref 3.80–5.20)
RDW: 15.9 % — AB (ref 11.5–14.5)
WBC: 3.1 10*3/uL — AB (ref 3.6–11.0)

## 2015-09-07 LAB — GLUCOSE, CAPILLARY
GLUCOSE-CAPILLARY: 90 mg/dL (ref 65–99)
Glucose-Capillary: 74 mg/dL (ref 65–99)

## 2015-09-07 LAB — ECHOCARDIOGRAM COMPLETE
Height: 62 in
Weight: 2190.4 oz

## 2015-09-07 MED ORDER — LEVOFLOXACIN IN D5W 500 MG/100ML IV SOLN
500.0000 mg | Freq: Once | INTRAVENOUS | Status: AC
  Administered 2015-09-07: 500 mg via INTRAVENOUS
  Filled 2015-09-07: qty 100

## 2015-09-07 MED ORDER — LEVOFLOXACIN IN D5W 250 MG/50ML IV SOLN
250.0000 mg | INTRAVENOUS | Status: DC
  Administered 2015-09-08: 250 mg via INTRAVENOUS
  Filled 2015-09-07: qty 50

## 2015-09-07 MED ORDER — LEVOFLOXACIN IN D5W 250 MG/50ML IV SOLN: 250.0000 mg | INTRAVENOUS | Status: DC

## 2015-09-07 MED ORDER — LEVOFLOXACIN IN D5W 500 MG/100ML IV SOLN: 500.0000 mg | INTRAVENOUS | Status: DC

## 2015-09-07 NOTE — Progress Notes (Addendum)
Initial Nutrition Assessment  DOCUMENTATION CODES:   Non-severe (moderate) malnutrition in context of chronic illness  INTERVENTION:  -[employer]r Cup on [employer] [place] to pt preferences; provided menu to pt's family and   NUTRITION DIAGNOSIS:   Malnutrition related to chronic illness as evidenced by severe depletion of muscle mass, mild depletion of body fat.  GOAL:   Patient will meet greater than or equal to 90% of their needs  MONITOR:   PO intake, Supplement acceptance, Labs, Weight trends  REASON FOR ASSESSMENT:   Malnutrition Screening Tool, Consult Assessment of nutrition requirement/status  ASSESSMENT:   82 yo female admitted with syncope, questionable cardiac arrest s/p CPR, generalized weakness, acute cystitis. Pt with dementia at baseline.   Pt is edentulous, although family report that she has no problems chewing or swallowing. Family reports pt can chew meats, raw fruits and vegetables without difficulty. Family reports pt does not take nutritional supplements at home; they report appetite has been fairly good. Pt ambulates with cane.   Past Medical History  Diagnosis Date  . Hypertension   . Dementia   . Endometrial cancer ([place])   . Chronic headache   . Vulvar dystrophy   . Gout   . Angioedema   . UTI (lower urinary tract infection)   . Breast mass, left   . Malaise and fatigue   . Sleep apnea   . Chronic back pain   . Weight loss   . Edema of both legs   . Torticollis   . Hematuria   . Vaginal candidiasis   . CAD (coronary artery disease)   . Hemorrhagic cystitis   . Dementia   . Asthma      Diet Order:  Diet regular Room service appropriate?: Yes; Fluid consistency:: Thin  Energy Intake: recorded po intake 50% at lunch, 75% at breakfast  Skin:  Reviewed, no issues  Last BM:  09/06/15   Labs: reviewed  Meds: NS at 75 ml/hr, lasix  Nutrition-Focused physical exam completed. Findings are mild fat depletion, mild to  severe muscle depletion, and mild edema.   Height:   Ht Readings from Last 1 Encounters:  09/06/15 5\' 2"  (1.575 m)    Weight: family reports some wt loss but does not know how much; according to wt encounters, 17% wt loss since June of last year.   Wt Readings from Last 1 Encounters:  09/07/15 136 lb 14.4 oz (62.097 kg)    BMI:  Body mass index is 25.03 kg/(m^2).  Estimated Nutritional Needs:   Kcal:  1550-1860 kcals   Protein:  62-74 g (1.0-1.2 g/kg)   Fluid:  >1.5 L per day  EDUCATION NEEDS:   No education needs identified at this time  Kerman Passey [place], [place], [place] [phone] Pager  [phone] Weekend/On-Call Pager

## 2015-09-07 NOTE — Progress Notes (Signed)
Levofloxacin ordered 500mg  q24hr.  Patient has CrCl 39 ml/min.  Have renal dosed as 500mg  IV ONCE followed by 250mg  IV q48 hrs.

## 2015-09-07 NOTE — Progress Notes (Signed)
Patient has been alert, only oriented to self. Vitals are stable. Orthostatics negative. Blood pressure a little high while standing, but came down once meds were given. Family has been at bedside all day. PT evaluated patient today. Cardiology consult still pending, [facility] cardiology has been contacted. Will continue to monitor.

## 2015-09-07 NOTE — Progress Notes (Signed)
Physical Therapy Evaluation Patient Details Name: Sara Heath MRN: [id number] DOB: 11-Feb-1922 Today's Date: 09/07/2015   History of Present Illness  Sara Heath is a 82 y.o. female has a past medical history significant for CAD s/p MI, endometrial cancer s/p XRT, and HTN with dementia now with a syncopal episode while outside at a family picnic. Pt is demented and hx comes from family. Apparently was sitting in a chair outside and "slumpred over". EMS was called and CPR was initiated. She received 2 doses of epi in the field and returned to normal. Currently c/o chest pain that occurs with palpation(presumably from CPR). EKG and troponin and CXR OK in ER. She is now admitted.  Pt positive for UTI.  Clinical Impression  Pt presents to PT below baseline for functional strength and mobility.  Pt lives with daughter and son-in-law in a one story home with 2 steps to enter. Pt usually ambulates with cane household distances and uses wheelchair for longer distances outside the home.  Pt able to transfer supine>sit with Mod A this date and daughter reports she usually helps with bed mobility at home.  Pt stands with Mod A and ambulates using RW for 8' with Min A.  Pt would benefit from acute PT services to address objective findings.   Follow Up Recommendations Home health PT;Supervision/Assistance - 24 hour    Equipment Recommendations  Rolling walker with 5" wheels;None recommended by PT (has needed equipment)    Recommendations for Other Services       Precautions / Restrictions Precautions Precautions: Fall Restrictions Weight Bearing Restrictions: No      Mobility  Bed Mobility Overal bed mobility: Needs Assistance Bed Mobility: Supine to Sit     Supine to sit: Mod assist     General bed mobility comments: HOB elevated, assist to elevate trunk, rotate hips, and to scoot to EOB.  Transfers Overall transfer level: Needs assistance Equipment used: Rolling walker (2  wheeled) Transfers: Sit to/from Stand Sit to Stand: Mod assist         General transfer comment: Mod A for lift off from bed and slow to rise, pushing down with B UE's on stabilized RW  Ambulation/Gait Ambulation/Gait assistance: Min assist Ambulation Distance (Feet): 8 Feet Assistive device: Rolling walker (2 wheeled) Gait Pattern/deviations: Step-through pattern;Decreased step length - right;Decreased step length - left;Narrow base of support   Gait velocity interpretation: Below normal speed for age/gender General Gait Details: Slow gait with forward flexed posture and heavy lean on RW, Min A to steer/navigate in room.  Stairs            Wheelchair Mobility    Modified Rankin (Stroke Patients Only)       Balance Overall balance assessment: Needs assistance Sitting-balance support: Feet supported;Bilateral upper extremity supported Sitting balance-Leahy Scale: Good Sitting balance - Comments: static sitting in midline with supervision   Standing balance support: Bilateral upper extremity supported;During functional activity Standing balance-Leahy Scale: Fair Standing balance comment: decreased balance with turn to chair with RW, no LOB, but extra time required.                             Pertinent Vitals/Pain Pain Assessment: No/denies pain    Home Living Family/patient expects to be discharged to:: Private residence Living Arrangements: Children Available Help at Discharge: Family Type of Home: House Home Access: Stairs to enter Entrance Stairs-Rails: None Entrance Stairs-Number of Steps: 2 Home Layout:  One level Home Equipment: Walker - standard;Cane - single point      Prior Function           Comments: Walking with cane household and short community distances; assist from daughter for bathing, dresses self.     Hand Dominance        Extremity/Trunk Assessment   Upper Extremity Assessment: Generalized weakness            Lower Extremity Assessment: Generalized weakness         Communication   Communication: No difficulties (quiet, dosen't talk much)  Cognition Arousal/Alertness: Awake/alert Behavior During Therapy: Flat affect Overall Cognitive Status: History of cognitive impairments - at baseline       Memory: Decreased short-term memory              General Comments      Exercises        Assessment/Plan    PT Assessment Patient needs continued PT services  PT Diagnosis Difficulty walking;Generalized weakness   PT Problem List Decreased strength;Decreased activity tolerance;Decreased mobility;Decreased cognition;Decreased knowledge of use of DME  PT Treatment Interventions DME instruction;Gait training;Stair training;Functional mobility training;Therapeutic activities;Therapeutic exercise;Balance training;Patient/family education   PT Goals (Current goals can be found in the Care Plan section) Acute Rehab PT Goals Patient Stated Goal: To go home. PT Goal Formulation: With patient Time For Goal Achievement: 09/14/15 Potential to Achieve Goals: Good    Frequency Min 2X/week   Barriers to discharge        Co-evaluation               End of Session Equipment Utilized During Treatment: Gait belt Activity Tolerance: Patient tolerated treatment well Patient left: in chair;with chair alarm set;with nursing/sitter in room Nurse Communication: Mobility status    Functional Assessment Tool Used: clinical judgement Functional Limitation: Mobility: Walking and moving around Mobility: Walking and Moving Around Current Status [id number]): At least 40 percent but less than 60 percent impaired, limited or restricted Mobility: Walking and Moving Around Goal Status [phone]): At least 20 percent but less than 40 percent impaired, limited or restricted    Time: 0955-1030 PT Time Calculation (min) (ACUTE ONLY): 35 min   Charges:   PT Evaluation $PT Eval Low Complexity: 1  Procedure     PT G Codes:   PT G-Codes **NOT FOR INPATIENT CLASS** Functional Assessment Tool Used: clinical judgement Functional Limitation: Mobility: Walking and moving around Mobility: Walking and Moving Around Current Status [id number]): At least 40 percent but less than 60 percent impaired, limited or restricted Mobility: Walking and Moving Around Goal Status [phone]): At least 20 percent but less than 40 percent impaired, limited or restricted    Lachrista Heslin A Teana Lindahl 09/07/2015, 11:29 AM

## 2015-09-07 NOTE — Progress Notes (Signed)
[facility] NAME: Sara Heath    MR#:  [id number]  DATE OF BIRTH:  October 06, 1921  SUBJECTIVE:  CHIEF COMPLAINT:   Chief Complaint  Patient presents with  . Cardiac Arrest  Patient is a 82 year old African-American female with past history significant for history of essential hypertension, dementia, obstructive sleep apnea, chronic back pain, weight loss, coronary artery disease who presents to the hospital with complaints of syncopal epis. Apparently patient was a family picnic outside and developed syncope or while sitting in the chair. EMS was called and CPR was initiated. Patient received 2 doses of epi and return to normal. Patient was brought to emergency room for further evaluation and was admitted. Patient's labs revealed pyuria. The patient complains of dysuria and pain in lower abdomen.   Review of Systems  Constitutional: Negative for fever, chills and weight loss.  HENT: Negative for congestion.   Eyes: Negative for blurred vision and double vision.  Respiratory: Negative for cough, sputum production, shortness of breath and wheezing.   Cardiovascular: Negative for chest pain, palpitations, orthopnea, leg swelling and PND.  Gastrointestinal: Positive for abdominal pain. Negative for nausea, vomiting, diarrhea, constipation and blood in stool.  Genitourinary: Negative for dysuria, urgency, frequency and hematuria.  Musculoskeletal: Negative for falls.  Neurological: Negative for dizziness, tremors, focal weakness and headaches.  Endo/Heme/Allergies: Does not bruise/bleed easily.  Psychiatric/Behavioral: Negative for depression. The patient does not have insomnia.     VITAL SIGNS: Blood pressure 143/77, pulse 63, temperature 97.8 F (36.6 C), temperature source Oral, resp. rate 16, height 5\' 2"  (1.575 m), weight 62.097 kg (136 lb 14.4 oz), SpO2 100 %.  PHYSICAL EXAMINATION:   GENERAL:  82 y.o.-year-old patient lying  in the bed with no acute distress.  EYES: Pupils equal, round, reactive to light and accommodation. No scleral icterus. Extraocular muscles intact.  HEENT: Head atraumatic, normocephalic. Oropharynx and nasopharynx clear.  NECK:  Supple, no jugular venous distention. No thyroid enlargement, no tenderness.  LUNGS: Normal breath sounds bilaterally, no wheezing, rales,rhonchi or crepitation. No use of accessory muscles of respiration.  CARDIOVASCULAR: S1, S2 normal. No murmurs, rubs, or gallops.  ABDOMEN: Soft, Discomfort on palpation in lower abdomen but no rebound or guarding were noted and no massesondistended. Bowel sounds present. No organomegaly or mass.  EXTREMITIES: No pedal edema, cyanosis, or clubbing.  NEUROLOGIC: Cranial nerves II through XII are intact. Muscle strength 5/5 in all extremities. Sensation intact. Gait not checked.  PSYCHIATRIC: The patient is alert and oriented x 1, able to follow some simple commands.  SKIN: No obvious rash, lesion, or ulcer.   ORDERS/RESULTS REVIEWED:   CBC  Recent Labs Lab 09/06/15 1813 09/07/15 0702  WBC 5.0 3.1*  HGB 11.5* 10.9*  HCT 35.3 33.3*  PLT 159 149*  MCV 96.3 95.9  MCH 31.5 31.3  MCHC 32.7 32.7  RDW 15.8* 15.9*  LYMPHSABS 0.6*  --   MONOABS 0.5  --   EOSABS 0.2  --   BASOSABS 0.0  --    ------------------------------------------------------------------------------------------------------------------  Chemistries   Recent Labs Lab 09/06/15 1813 09/07/15 0607  NA 140 141  K 3.9 4.3  CL 108 109  CO2 29 26  GLUCOSE 104* 91  BUN 13 10  CREATININE 0.75 0.69  CALCIUM 9.6 9.4  AST  --  18  ALT  --  9*  ALKPHOS  --  24*  BILITOT  --  0.7   ------------------------------------------------------------------------------------------------------------------ estimated creatinine clearance is  39.7 mL/min (by C-G formula based on Cr of  0.69). ------------------------------------------------------------------------------------------------------------------ No results for input(s): TSH, T4TOTAL, T3FREE, THYROIDAB in the last 72 hours.  Invalid input(s): FREET3  Cardiac Enzymes  Recent Labs Lab 09/06/15 1813 09/06/15 2348 09/07/15 0607  TROPONINI <0.03 <0.03 <0.03   ------------------------------------------------------------------------------------------------------------------ Invalid input(s): POCBNP ---------------------------------------------------------------------------------------------------------------  RADIOLOGY: Dg Chest 1 View  09/06/2015  CLINICAL DATA:  Recent cardiac arrest EXAM: CHEST 1 VIEW COMPARISON:  12/22/2014 FINDINGS: Cardiac shadow is stable. The lungs are well aerated bilaterally. No pneumothorax is seen. No acute bony abnormalities noted. IMPRESSION: Stable appearance of the chest from the previous exam. Electronically Signed   By: Inez Catalina M.D.   On: 09/06/2015 19:52   Ct Head Wo Contrast  09/06/2015  CLINICAL DATA:  Recent syncopal episode EXAM: CT HEAD WITHOUT CONTRAST TECHNIQUE: Contiguous axial images were obtained from the base of the skull through the vertex without intravenous contrast. COMPARISON:  10/02/2014 FINDINGS: Bony calvarium is intact. Diffuse atrophic changes are again identified. No findings to suggest acute hemorrhage, acute infarction or space-occupying mass lesion are noted. Some scattered areas decreased attenuation are noted in the deep white matter consistent with chronic white matter ischemic change. IMPRESSION: Atrophy and chronic ischemic change.  No acute abnormality is noted. Electronically Signed   By: Inez Catalina M.D.   On: 09/06/2015 19:36   US Carotid Bilateral  09/07/2015  CLINICAL DATA:  Syncope.  Hypertension, coronary artery disease. EXAM: BILATERAL CAROTID DUPLEX ULTRASOUND TECHNIQUE: Pearline Cables scale imaging, color Doppler and duplex ultrasound was performed  of bilateral carotid and vertebral arteries in the neck. COMPARISON:  None. TECHNIQUE: Quantification of carotid stenosis is based on velocity parameters that correlate the residual internal carotid diameter with NASCET-based stenosis levels, using the diameter of the distal internal carotid lumen as the denominator for stenosis measurement. The following velocity measurements were obtained: PEAK SYSTOLIC/END DIASTOLIC RIGHT ICA:                     138/32cm/sec CCA:                     0000000 SYSTOLIC ICA/CCA RATIO:  [id number] DIASTOLIC ICA/CCA RATIO: 2.3 ECA:                     66cm/sec LEFT ICA:                     99/22cm/sec CCA:                     [id number] SYSTOLIC ICA/CCA RATIO:  [id number] DIASTOLIC ICA/CCA RATIO: [id number] ECA:                     43cm/sec FINDINGS: RIGHT CAROTID ARTERY: Eccentric partially calcified plaque in the bulb. No high-grade stenosis. Normal waveforms and color Doppler signal. RIGHT VERTEBRAL ARTERY:  Normal flow direction and waveform. LEFT CAROTID ARTERY: Minimal plaque in the bulb. No significant stenosis. Normal waveforms and color Doppler signal. Distal ICA tortuous. LEFT VERTEBRAL ARTERY: Normal flow direction and waveform. IMPRESSION: 1. Mild bilateral carotid bifurcation plaque resulting in less than 50% diameter stenosis. The exam does not exclude plaque ulceration or embolization. Continued surveillance recommended. 2.  Antegrade bilateral vertebral arterial flow. Electronically Signed   By: Lucrezia Europe M.D.   On: 09/07/2015 11:39    EKG:  Orders placed or performed during the hospital encounter of 09/06/15  . ED EKG  . ED  EKG    ASSESSMENT AND PLAN:  Principal Problem:   Syncope Active Problems:   CAD (coronary artery disease)   HTN, goal below 140/80   History of endometrial cancer #1 syncope, unclear etiology at this time, likely related to infection, getting orthostatic vital signs, carotid ultrasound, echocardiogram, following clinically  clinically. Get  physical therapist evaluation .   #2. Pancytopenia, unclear etiology, questionable viral disease, follow tomorrow morning.  #3. Acute cystitis with hematuria, getting urinary cultures, initiate the patient on Rocephin intravenously #4. Questionable cardiac arrest, status post CPR, getting cardiologist involved for further recommendations   #5. Generalized weakness, initiate physical therapy  Management plans discussed with the patient, family and they are in agreement.   DRUG ALLERGIES:  Allergies  Allergen Reactions  . Penicillins Anaphylaxis    Has patient had a PCN reaction causing immediate rash, facial/tongue/throat swelling, SOB or lightheadedness with hypotension: Yes Has patient had a PCN reaction causing severe rash involving mucus membranes or skin necrosis: No Has patient had a PCN reaction that required hospitalization No Has patient had a PCN reaction occurring within the last 10 years: No If all of the above answers are "NO", then may proceed with Cephalosporin use.    CODE STATUS:     Code Status Orders        Start     Ordered   09/06/15 2201  Full code   Continuous     09/06/15 2200    Code Status History    Date Active Date Inactive Code Status Order ID Comments User Context   This patient has a current code status but no historical code status.      TOTAL TIME TAKING CARE OF THIS PATIENT: . 40 minutes.    Theodoro Grist M.D on 09/07/2015 at 11:48 AM  Between 7am to 6pm - Pager - [phone]  After 6pm go to [email or web address] - password [email or web address] [facility]  Office  [phone]  CC: Primary care physician; Petra Kuba, MD

## 2015-09-07 NOTE — Progress Notes (Signed)
*  PRELIMINARY RESULTS* Echocardiogram 2D Echocardiogram has been performed.  Sara Heath 09/07/2015, 11:56 AM

## 2015-09-08 DIAGNOSIS — D61818 Other pancytopenia: Secondary | ICD-10-CM

## 2015-09-08 DIAGNOSIS — E440 Moderate protein-calorie malnutrition: Secondary | ICD-10-CM

## 2015-09-08 DIAGNOSIS — R531 Weakness: Secondary | ICD-10-CM

## 2015-09-08 DIAGNOSIS — N3001 Acute cystitis with hematuria: Secondary | ICD-10-CM

## 2015-09-08 DIAGNOSIS — R55 Syncope and collapse: Secondary | ICD-10-CM | POA: Diagnosis not present

## 2015-09-08 DIAGNOSIS — F0390 Unspecified dementia without behavioral disturbance: Secondary | ICD-10-CM

## 2015-09-08 LAB — CBC
HEMATOCRIT: 34 % — AB (ref 35.0–47.0)
Hemoglobin: 11.3 g/dL — ABNORMAL LOW (ref 12.0–16.0)
MCH: 32 pg (ref 26.0–34.0)
MCHC: 33.3 g/dL (ref 32.0–36.0)
MCV: 96.1 fL (ref 80.0–100.0)
Platelets: 147 10*3/uL — ABNORMAL LOW (ref 150–440)
RBC: 3.54 MIL/uL — ABNORMAL LOW (ref 3.80–5.20)
RDW: 15.6 % — AB (ref 11.5–14.5)
WBC: 3.9 10*3/uL (ref 3.6–11.0)

## 2015-09-08 LAB — GLUCOSE, CAPILLARY
GLUCOSE-CAPILLARY: 82 mg/dL (ref 65–99)
GLUCOSE-CAPILLARY: 118 mg/dL — AB (ref 65–99)

## 2015-09-08 MED ORDER — LEVOFLOXACIN 250 MG PO TABS: 250.0000 mg | ORAL_TABLET | Freq: Every day | ORAL | Status: DC

## 2015-09-08 NOTE — Consult Note (Signed)
Sara Heath  Patient ID: Sara Heath, MRN: [id number], DOB/AGE: [birth year] 82 y.o. Admit date: 09/06/2015   Date of Consult: 09/08/2015 Primary Physician: Sara Kuba, MD Primary Cardiologist:None  Chief Complaint:  Chief Complaint  Patient presents with  . Cardiac Arrest   Reason for Consult: syncope  HPI: 82 y.o. female with the known dementia having a previous history of coronary artery disease status post previous myocardial infarction essential hypertension mixed hyperlipidemia who has been out and a picnic area for which the patient had a syncopal episode and was slumping over. The patient had CPR and other resuscitation and was revived. At that time the patient did have some chest discomfort and some shortness of breath although there was no evidence of EKG changes and/or troponin elevation to signify any myocardial infarction. The patient did recover and had no evidence of congestive heart failure with an echocardiogram showing normal LV systolic function with no evidence of significant valvular heart disease. It did appear that the patient possibly was dehydrated or overmedicated with the setting of heat. There is a possibility of infection as well. There is no further primary source of cardiac syncope at this time other than medication management. The patient is demented and unable to give any further history at this time  Past Medical History  Diagnosis Date  . Hypertension   . Dementia   . Endometrial cancer ([place])   . Chronic headache   . Vulvar dystrophy   . Gout   . Angioedema   . UTI (lower urinary tract infection)   . Breast mass, left   . Malaise and fatigue   . Sleep apnea   . Chronic back pain   . Weight loss   . Edema of both legs   . Torticollis   . Hematuria   . Vaginal candidiasis   . CAD (coronary artery disease)   . Hemorrhagic cystitis   . Dementia   . Asthma       Surgical History:  Past Surgical History   Procedure Laterality Date  . Tubal ligation    . Left breast biopsy    . Coronary angioplasty with stent placement       Home Meds: Prior to Admission medications   Medication Sig Start Date End Date Taking? Authorizing Provider  aspirin EC 81 MG tablet Take 81 mg by mouth daily.    Yes Historical Provider, MD  cetirizine (ZYRTEC) 10 MG tablet Take 10 mg by mouth daily as needed for allergies.  11/26/14  Yes Historical Provider, MD  colchicine 0.6 MG tablet take 1 tablet by mouth once daily for PREVENT GOUT take 2 tablets...  (REFER TO PRESCRIPTION NOTES). 07/03/15  Yes Historical Provider, MD  furosemide (LASIX) 40 MG tablet Take 40 mg by mouth every other day.    Yes Historical Provider, MD  isosorbide mononitrate (IMDUR) 120 MG 24 hr tablet Take 120 mg by mouth daily.  11/13/14  Yes Historical Provider, MD  metoprolol succinate (TOPROL-XL) 50 MG 24 hr tablet Take 50 mg by mouth daily.  11/26/14  Yes Historical Provider, MD  nitroGLYCERIN (NITROSTAT) 0.4 MG SL tablet Place 0.4 mg under the tongue every 5 (five) minutes x 3 doses as needed for chest pain. *if not resolved , go to emergency department.*   Yes Historical Provider, MD  RA ANTACID/ANTI-GAS MAX ST [id number] MG/5ML suspension take 10 to 20 milliliters by mouth UP TO twice a day for CHEST PAIN AFTER EATING 10/01/14  Yes  Historical Provider, MD  RA COL-RITE 100 MG capsule take 100 mg by mouth twice a day if needed for STOOL SOFTENING 10/01/14  Yes Historical Provider, MD  simvastatin (ZOCOR) 20 MG tablet Take 20 mg by mouth daily.  12/05/14  Yes Historical Provider, MD    Inpatient Medications:  . aspirin EC  81 mg Oral Daily  . docusate sodium  100 mg Oral BID  . furosemide  40 mg Oral QODAY  . heparin  5,000 Units Subcutaneous Q8H  . isosorbide mononitrate  120 mg Oral Daily  . levofloxacin (LEVAQUIN) IV  250 mg Intravenous Q24H  . metoprolol succinate  50 mg Oral Daily  . simvastatin  20 mg Oral Daily  . sodium chloride flush  3 mL  Intravenous Q12H      Allergies:  Allergies  Allergen Reactions  . Penicillins Anaphylaxis    Has patient had a PCN reaction causing immediate rash, facial/tongue/throat swelling, SOB or lightheadedness with hypotension: Yes Has patient had a PCN reaction causing severe rash involving mucus membranes or skin necrosis: No Has patient had a PCN reaction that required hospitalization No Has patient had a PCN reaction occurring within the last 10 years: No If all of the above answers are "NO", then may proceed with Cephalosporin use.    Social History   Social History  . Marital Status: Widowed    Spouse Name: N/A  . Number of Children: N/A  . Years of Education: N/A   Occupational History  . Not on file.   Social History Main Topics  . Smoking status: Never Smoker   . Smokeless tobacco: Never Used  . Alcohol Use: No  . Drug Use: Not on file  . Sexual Activity: Not on file   Other Topics Concern  . Not on file   Social History Narrative     Family History  Problem Relation Age of Onset  . Family history unknown: Yes     Review of Systems  Unable to assess due to dementia Labs:  Recent Labs  09/06/15 1813 09/06/15 2348 09/07/15 0607  TROPONINI <0.03 <0.03 <0.03   Lab Results  Component Value Date   WBC 3.9 09/08/2015   HGB 11.3* 09/08/2015   HCT 34.0* 09/08/2015   MCV 96.1 09/08/2015   PLT 147* 09/08/2015    Recent Labs Lab 09/07/15 0607  NA 141  K 4.3  CL 109  CO2 26  BUN 10  CREATININE 0.69  CALCIUM 9.4  PROT 6.2*  BILITOT 0.7  ALKPHOS 24*  ALT 9*  AST 18  GLUCOSE 91   Lab Results  Component Value Date   CHOL 134 09/03/2011   HDL 49 09/03/2011   LDLCALC 73 09/03/2011   TRIG 59 09/03/2011   No results found for: DDIMER  Radiology/Studies:  Dg Chest 1 View  09/06/2015  CLINICAL DATA:  Recent cardiac arrest EXAM: CHEST 1 VIEW COMPARISON:  12/22/2014 FINDINGS: Cardiac shadow is stable. The lungs are well aerated bilaterally. No  pneumothorax is seen. No acute bony abnormalities noted. IMPRESSION: Stable appearance of the chest from the previous exam. Electronically Signed   By: Sara Heath M.D.   On: 09/06/2015 19:52   Ct Head Wo Contrast  09/06/2015  CLINICAL DATA:  Recent syncopal episode EXAM: CT HEAD WITHOUT CONTRAST TECHNIQUE: Contiguous axial images were obtained from the base of the skull through the vertex without intravenous contrast. COMPARISON:  10/02/2014 FINDINGS: Bony calvarium is intact. Diffuse atrophic changes are again identified. No findings  to suggest acute hemorrhage, acute infarction or space-occupying mass lesion are noted. Some scattered areas decreased attenuation are noted in the deep white matter consistent with chronic white matter ischemic change. IMPRESSION: Atrophy and chronic ischemic change.  No acute abnormality is noted. Electronically Signed   By: Sara Heath M.D.   On: 09/06/2015 19:36   US Carotid Bilateral  09/07/2015  CLINICAL DATA:  Syncope.  Hypertension, coronary artery disease. EXAM: BILATERAL CAROTID DUPLEX ULTRASOUND TECHNIQUE: Pearline Cables scale imaging, color Doppler and duplex ultrasound was performed of bilateral carotid and vertebral arteries in the neck. COMPARISON:  None. TECHNIQUE: Quantification of carotid stenosis is based on velocity parameters that correlate the residual internal carotid diameter with NASCET-based stenosis levels, using the diameter of the distal internal carotid lumen as the denominator for stenosis measurement. The following velocity measurements were obtained: PEAK SYSTOLIC/END DIASTOLIC RIGHT ICA:                     138/32cm/sec CCA:                     0000000 SYSTOLIC ICA/CCA RATIO:  [id number] DIASTOLIC ICA/CCA RATIO: 2.3 ECA:                     66cm/sec LEFT ICA:                     99/22cm/sec CCA:                     [id number] SYSTOLIC ICA/CCA RATIO:  [id number] DIASTOLIC ICA/CCA RATIO: [id number] ECA:                     43cm/sec FINDINGS: RIGHT CAROTID ARTERY:  Eccentric partially calcified plaque in the bulb. No high-grade stenosis. Normal waveforms and color Doppler signal. RIGHT VERTEBRAL ARTERY:  Normal flow direction and waveform. LEFT CAROTID ARTERY: Minimal plaque in the bulb. No significant stenosis. Normal waveforms and color Doppler signal. Distal ICA tortuous. LEFT VERTEBRAL ARTERY: Normal flow direction and waveform. IMPRESSION: 1. Mild bilateral carotid bifurcation plaque resulting in less than 50% diameter stenosis. The exam does not exclude plaque ulceration or embolization. Continued surveillance recommended. 2.  Antegrade bilateral vertebral arterial flow. Electronically Signed   By: Lucrezia Europe M.D.   On: 09/07/2015 11:39    EKG: Normal sinus rhythm  Weights: Filed Weights   09/06/15 2204 09/07/15 0541 09/08/15 0625  Weight: 134 lb 3.2 oz (60.873 kg) 136 lb 14.4 oz (62.097 kg) 132 lb 8 oz (60.102 kg)     Physical Exam: Blood pressure 108/55, pulse 54, temperature 98.1 F (36.7 C), temperature source Oral, resp. rate 16, height 5\' 2"  (1.575 m), weight 132 lb 8 oz (60.102 kg), SpO2 100 %. Body mass index is 24.23 kg/(m^2). General: Well developed, well nourished, in no acute distress. Head eyes ears nose throat: Normocephalic, atraumatic, sclera non-icteric, no xanthomas, nares are without discharge. No apparent thyromegaly and/or mass  Lungs: Normal respiratory effort.  no wheezes, no rales, no rhonchi.  Heart: RRR with normal S1 S2. no murmur gallop, no rub, PMI is normal size and placement, carotid upstroke normal without bruit, jugular venous pressure is normal Abdomen: Soft, non-tender, non-distended with normoactive bowel sounds. No hepatomegaly. No rebound/guarding. No obvious abdominal masses. Abdominal aorta is normal size   Extremities: No edema. no cyanosis, no clubbing, no ulcers  Peripheral : 2+ bilateral upper extremity pulses, 2+ bilateral femoral pulses, 2+  bilateral dorsal pedal pulse Neuro: Not Alert and oriented. No  facial asymmetry. No focal deficit. Moves all extremities spontaneously. Musculoskeletal: Normal muscle tone without kyphosis Psych:  Does not Responds to questions appropriately with a normal affect.    Assessment: 82 year old female with history of coronary artery disease status post myocardial infarction essential hypertension with the dementia and a syncopal episode more consistent with circumstances possible dehydration and overmedication in the setting of the heat of the day without evidence of primary cardiac cause  Plan: 1. Continue medication management for hypertension control but further evaluation with ambulation of adjustments of medications for a goal blood pressure above 130 although below 150 mm 2. No further cardiac intervention and/or diagnostics necessary at this time 3. Possible discontinuation of isosorbide and/or decrease of dose which may have caused symptoms listed above 4. Further treatment options after ambulation  Signed, Corey Skains M.D. [facility] 09/08/2015, 8:52 AM

## 2015-09-08 NOTE — Care Management Note (Signed)
Case Management Note  Patient Details  Name: Sara Heath MRN: [id number] Date of Birth: [birth year]  Subjective/Objective:   Spoke with daughter. She is agreeable to home health for her mother. Patient lives in home with daughter. No agency preference. Referral to [facility] with [facility].                  Action/Plan: RN/PT with [facility]  Expected Discharge Date:   09/08/2015               Expected Discharge Plan:  [place]  In-House Referral:     Discharge planning Services  CM Consult  Post Acute Care Choice:  Home Health Choice offered to:  Adult Children  DME Arranged:    DME Agency:  [place] Arranged:  RN, PT [facility] Agency:  [facility]  Status of Service:  Completed, signed off  Medicare Important Message Given:    Date Medicare IM Given:    Medicare IM give by:    Date Additional Medicare IM Given:    Additional Medicare Important Message give by:     If discussed at [place] of Stay Meetings, dates discussed:    Additional Comments:  [name], RN 09/08/2015, 12:14 PM

## 2015-09-08 NOTE — Discharge Summary (Signed)
[facility] NAME: Sara Heath    MR#:  [id number]  DATE OF BIRTH:  March 30, 1922  DATE OF ADMISSION:  09/06/2015 ADMITTING PHYSICIAN: Idelle Crouch, MD  DATE OF DISCHARGE: 09/08/2015  2:42 PM  PRIMARY CARE PHYSICIAN: Petra Kuba, MD     ADMISSION DIAGNOSIS:  Cardiac arrest ([place]) [I46.9] Syncope and collapse [R55] Syncope [R55]  DISCHARGE DIAGNOSIS:  Principal Problem:   Syncope Active Problems:   Malnutrition of moderate degree   Pancytopenia (HCC)   Acute cystitis with hematuria   General weakness   CAD (coronary artery disease)   HTN, goal below 140/80   History of endometrial cancer   Dementia   SECONDARY DIAGNOSIS:   Past Medical History  Diagnosis Date  . Hypertension   . Dementia   . Endometrial cancer ([place])   . Chronic headache   . Vulvar dystrophy   . Gout   . Angioedema   . UTI (lower urinary tract infection)   . Breast mass, left   . Malaise and fatigue   . Sleep apnea   . Chronic back pain   . Weight loss   . Edema of both legs   . Torticollis   . Hematuria   . Vaginal candidiasis   . CAD (coronary artery disease)   . Hemorrhagic cystitis   . Dementia   . Asthma     .pro HOSPITAL COURSE:   Patient is 82 year old African-American female with a history significant for history of essential hypertension, dementia, endometrial cancer, chronic back pain who presents to the hospital with complaints of syncopal episode. Currently, patient was sitting in the chair and family picnic and she slumped over, EMS was called and CPR was initiated, patient received 2 doses of epinephrine in the field and returned back to normal, she was brought to emergency room for further evaluation and treatment. In emergency room, her labs revealed low albumin level, mild anemia, mild hyperglycemia. Cardiac enzymes were checked and they were found to be normal. Urinalysis revealed significant pyuria,  concerning for urinary tract infection. Urine cultures grew more than 100,000 colonies for of gram-negative rods. ID and sensitivities to follow. Patient was initiated on Rocephin intravenously and her condition improved. Patient had a carotid ultrasound done which was significant for mild, carotid atherosclerosis of less than 50% bilaterally, echocardiogram was unremarkable, normal ejection fraction, mild LVH, elevated pulmonary arterial pressures. The patient was seen by cardiologist, Dr. Nehemiah Massed and no further interventions were recommended. Physical therapist evaluated patient and recommended home health services. Discussion by problem #1 syncope,  related to infection, orthostatic vital signs, carotid ultrasound, echocardiogram were unremarkable, patient improved clinically. Patient was seen by cardiologist and no further interventions or evaluations were recommended.  Physical therapistevaluated patient and recommended home services, which are going to be prescribed upon discharge..  #2. Pancytopenia, unclear etiology while blood cell count normalized. Patient remains anemic, stable. Patient's platelet count remains low, recommend to follow-up as outpatient. Differential showed no obvious schistocytes on admission  #3. Acute cystitis with hematuria due to gram-negative rods, urinary cultures are pending,patient has improved on Rocephin, change Rocephin to Keflex orally. Continue for 5 days, it is recommended to follow-up urinary cultures and change antibiotic to different if sensitivities not adequate  #4. Questionable cardiac arrest, status post CPR,. Appreciate cardiology's input, no further interventions were recommended, echocardiogram was performed and it was found to be unremarkable.   #5. Generalized weakness,  home health services with physical  therapy will be prescribed upon discharge  DISCHARGE CONDITIONS:   Stable  CONSULTS OBTAINED:  Treatment Team:  Corey Skains, MD  DRUG  ALLERGIES:   Allergies  Allergen Reactions  . Penicillins Anaphylaxis    Has patient had a PCN reaction causing immediate rash, facial/tongue/throat swelling, SOB or lightheadedness with hypotension: Yes Has patient had a PCN reaction causing severe rash involving mucus membranes or skin necrosis: No Has patient had a PCN reaction that required hospitalization No Has patient had a PCN reaction occurring within the last 10 years: No If all of the above answers are "NO", then may proceed with Cephalosporin use.    DISCHARGE MEDICATIONS:   Discharge Medication List as of 09/08/2015  1:54 PM    START taking these medications   Details  levofloxacin (LEVAQUIN) 250 MG tablet Take 1 tablet (250 mg total) by mouth daily., Starting 09/08/2015, Until Discontinued, Normal      CONTINUE these medications which have NOT CHANGED   Details  aspirin EC 81 MG tablet Take 81 mg by mouth daily. , Until Discontinued, Historical Med    cetirizine (ZYRTEC) 10 MG tablet Take 10 mg by mouth daily as needed for allergies. , Starting 11/26/2014, Until Discontinued, Historical Med    colchicine 0.6 MG tablet take 1 tablet by mouth once daily for PREVENT GOUT take 2 tablets...  (REFER TO PRESCRIPTION NOTES)., Historical Med    furosemide (LASIX) 40 MG tablet Take 40 mg by mouth every other day. , Until Discontinued, Historical Med    isosorbide mononitrate (IMDUR) 120 MG 24 hr tablet Take 120 mg by mouth daily. , Starting 11/13/2014, Until Discontinued, Historical Med    metoprolol succinate (TOPROL-XL) 50 MG 24 hr tablet Take 50 mg by mouth daily. , Starting 11/26/2014, Until Discontinued, Historical Med    nitroGLYCERIN (NITROSTAT) 0.4 MG SL tablet Place 0.4 mg under the tongue every 5 (five) minutes x 3 doses as needed for chest pain. *if not resolved , go to emergency department.*, Until Discontinued, Historical Med    RA ANTACID/ANTI-GAS MAX ST 400-400-40 MG/5ML suspension take 10 to 20 milliliters by mouth  UP TO twice a day for CHEST PAIN AFTER EATING, Historical Med    RA COL-RITE 100 MG capsule take 100 mg by mouth twice a day if needed for STOOL SOFTENING, Historical Med    simvastatin (ZOCOR) 20 MG tablet Take 20 mg by mouth daily. , Starting 12/05/2014, Until Discontinued, Historical Med         DISCHARGE INSTRUCTIONS:    Patient is to follow-up with primary care physician within one week after discharge. It is recommended to follow patient's urine cultures and sensitivities and adjust antibiotics depending on culture results    If you experience worsening of your admission symptoms, develop shortness of breath, life threatening emergency, suicidal or homicidal thoughts you must seek medical attention immediately by calling 911 or calling your MD immediately  if symptoms less severe.  You Must read complete instructions/literature along with all the possible adverse reactions/side effects for all the Medicines you take and that have been prescribed to you. Take any new Medicines after you have completely understood and accept all the possible adverse reactions/side effects.   Please note  You were cared for by a hospitalist during your hospital stay. If you have any questions about your discharge medications or the care you received while you were in the hospital after you are discharged, you can call the unit and asked to speak with  the hospitalist on call if the hospitalist that took care of you is not available. Once you are discharged, your primary care physician will handle any further medical issues. Please note that NO REFILLS for any discharge medications will be authorized once you are discharged, as it is imperative that you return to your primary care physician (or establish a relationship with a primary care physician if you do not have one) for your aftercare needs so that they can reassess your need for medications and monitor your lab values.    Today   CHIEF COMPLAINT:    Chief Complaint  Patient presents with  . Cardiac Arrest    HISTORY OF PRESENT ILLNESS:  Sara Heath  is a 82 y.o. female with a known history of essential hypertension, dementia, endometrial cancer, chronic back pain who presents to the hospital with complaints of syncopal episode. Currently, patient was sitting in the chair and family picnic and she slumped over, EMS was called and CPR was initiated, patient received 2 doses of epinephrine in the field and returned back to normal, she was brought to emergency room for further evaluation and treatment. In emergency room, her labs revealed low albumin level, mild anemia, mild hyperglycemia. Cardiac enzymes were checked and they were found to be normal. Urinalysis revealed significant pyuria, concerning for urinary tract infection. Urine cultures grew more than 100,000 colonies for of gram-negative rods. ID and sensitivities to follow. Patient was initiated on Rocephin intravenously and her condition improved. Patient had a carotid ultrasound done which was significant for mild, carotid atherosclerosis of less than 50% bilaterally, echocardiogram was unremarkable, normal ejection fraction, mild LVH, elevated pulmonary arterial pressures. The patient was seen by cardiologist, Dr. Nehemiah Massed and no further interventions were recommended. Physical therapist evaluated patient and recommended home health services. Discussion by problem #1 syncope,  related to infection, orthostatic vital signs, carotid ultrasound, echocardiogram were unremarkable, patient improved clinically. Patient was seen by cardiologist and no further interventions or evaluations were recommended.  Physical therapistevaluated patient and recommended home services, which are going to be prescribed upon discharge..  #2. Pancytopenia, unclear etiology while blood cell count normalized. Patient remains anemic, stable. Patient's platelet count remains low, recommend to follow-up as  outpatient. Differential showed no obvious schistocytes on admission  #3. Acute cystitis with hematuria due to gram-negative rods, urinary cultures are pending,patient has improved on Rocephin, change Rocephin to Keflex orally. Continue for 5 days, it is recommended to follow-up urinary cultures and change antibiotic to different if sensitivities not adequate  #4. Questionable cardiac arrest, status post CPR,. Appreciate cardiology's input, no further interventions were recommended, echocardiogram was performed and it was found to be unremarkable.   #5. Generalized weakness,  home health services with physical therapy will be prescribed upon discharge   VITAL SIGNS:  Blood pressure 99/49, pulse 66, temperature 97.3 F (36.3 C), temperature source Oral, resp. rate 17, height 5\' 2"  (1.575 m), weight 60.102 kg (132 lb 8 oz), SpO2 97 %.  I/O:   Intake/Output Summary (Last 24 hours) at 09/08/15 1446 Last data filed at 09/08/15 1425  Gross per 24 hour  Intake 568.75 ml  Output    650 ml  Net -81.25 ml    PHYSICAL EXAMINATION:  GENERAL:  82 y.o.-year-old patient lying in the bed with no acute distress.  EYES: Pupils equal, round, reactive to light and accommodation. No scleral icterus. Extraocular muscles intact.  HEENT: Head atraumatic, normocephalic. Oropharynx and nasopharynx clear.  NECK:  Supple, no jugular venous  distention. No thyroid enlargement, no tenderness.  LUNGS: Normal breath sounds bilaterally, no wheezing, rales,rhonchi or crepitation. No use of accessory muscles of respiration.  CARDIOVASCULAR: S1, S2 normal. No murmurs, rubs, or gallops.  ABDOMEN: Soft, non-tender, non-distended. Bowel sounds present. No organomegaly or mass.  EXTREMITIES: No pedal edema, cyanosis, or clubbing.  NEUROLOGIC: Cranial nerves II through XII are intact. Muscle strength 5/5 in all extremities. Sensation intact. Gait not checked.  PSYCHIATRIC: The patient is alert and oriented x 3.  SKIN: No  obvious rash, lesion, or ulcer.   DATA REVIEW:   CBC  Recent Labs Lab 09/08/15 0515  WBC 3.9  HGB 11.3*  HCT 34.0*  PLT 147*    Chemistries   Recent Labs Lab 09/07/15 0607  NA 141  K 4.3  CL 109  CO2 26  GLUCOSE 91  BUN 10  CREATININE 0.69  CALCIUM 9.4  AST 18  ALT 9*  ALKPHOS 24*  BILITOT 0.7    Cardiac Enzymes  Recent Labs Lab 09/07/15 0607  TROPONINI <0.03    Microbiology Results  Results for orders placed or performed during the hospital encounter of 09/06/15  Urine culture     Status: Abnormal (Preliminary result)   Collection Time: 09/06/15  9:06 PM  Result Value Ref Range Status   Specimen Description URINE, CATHETERIZED  Final   Special Requests NONE  Final   Culture (A)  Final    >=100,000 COLONIES/mL GRAM NEGATIVE RODS IDENTIFICATION AND SUSCEPTIBILITIES TO FOLLOW    Report Status PENDING  Incomplete    RADIOLOGY:  Dg Chest 1 View  09/06/2015  CLINICAL DATA:  Recent cardiac arrest EXAM: CHEST 1 VIEW COMPARISON:  12/22/2014 FINDINGS: Cardiac shadow is stable. The lungs are well aerated bilaterally. No pneumothorax is seen. No acute bony abnormalities noted. IMPRESSION: Stable appearance of the chest from the previous exam. Electronically Signed   By: Inez Catalina M.D.   On: 09/06/2015 19:52   Ct Head Wo Contrast  09/06/2015  CLINICAL DATA:  Recent syncopal episode EXAM: CT HEAD WITHOUT CONTRAST TECHNIQUE: Contiguous axial images were obtained from the base of the skull through the vertex without intravenous contrast. COMPARISON:  10/02/2014 FINDINGS: Bony calvarium is intact. Diffuse atrophic changes are again identified. No findings to suggest acute hemorrhage, acute infarction or space-occupying mass lesion are noted. Some scattered areas decreased attenuation are noted in the deep white matter consistent with chronic white matter ischemic change. IMPRESSION: Atrophy and chronic ischemic change.  No acute abnormality is noted. Electronically  Signed   By: Inez Catalina M.D.   On: 09/06/2015 19:36   US Carotid Bilateral  09/07/2015  CLINICAL DATA:  Syncope.  Hypertension, coronary artery disease. EXAM: BILATERAL CAROTID DUPLEX ULTRASOUND TECHNIQUE: Pearline Cables scale imaging, color Doppler and duplex ultrasound was performed of bilateral carotid and vertebral arteries in the neck. COMPARISON:  None. TECHNIQUE: Quantification of carotid stenosis is based on velocity parameters that correlate the residual internal carotid diameter with NASCET-based stenosis levels, using the diameter of the distal internal carotid lumen as the denominator for stenosis measurement. The following velocity measurements were obtained: PEAK SYSTOLIC/END DIASTOLIC RIGHT ICA:                     138/32cm/sec CCA:                     0000000 SYSTOLIC ICA/CCA RATIO:  [id number] DIASTOLIC ICA/CCA RATIO: 2.3 ECA:  66cm/sec LEFT ICA:                     99/22cm/sec CCA:                     [id number] SYSTOLIC ICA/CCA RATIO:  [id number] DIASTOLIC ICA/CCA RATIO: [id number] ECA:                     43cm/sec FINDINGS: RIGHT CAROTID ARTERY: Eccentric partially calcified plaque in the bulb. No high-grade stenosis. Normal waveforms and color Doppler signal. RIGHT VERTEBRAL ARTERY:  Normal flow direction and waveform. LEFT CAROTID ARTERY: Minimal plaque in the bulb. No significant stenosis. Normal waveforms and color Doppler signal. Distal ICA tortuous. LEFT VERTEBRAL ARTERY: Normal flow direction and waveform. IMPRESSION: 1. Mild bilateral carotid bifurcation plaque resulting in less than 50% diameter stenosis. The exam does not exclude plaque ulceration or embolization. Continued surveillance recommended. 2.  Antegrade bilateral vertebral arterial flow. Electronically Signed   By: Lucrezia Europe M.D.   On: 09/07/2015 11:39    EKG:   Orders placed or performed during the hospital encounter of 09/06/15  . ED EKG  . ED EKG      Management plans discussed with the patient, family and they  are in agreement.  CODE STATUS:     Code Status Orders        Start     Ordered   09/06/15 2201  Full code   Continuous     09/06/15 2200    Code Status History    Date Active Date Inactive Code Status Order ID Comments User Context   This patient has a current code status but no historical code status.      TOTAL TIME TAKING CARE OF THIS PATIENT: 40  minutes.    Theodoro Grist M.D on 09/08/2015 at 2:46 PM  Between 7am to 6pm - Pager - [phone]  After 6pm go to [email or web address] - password [email or web address] [facility]  Office  [phone]  CC: Primary care physician; Petra Kuba, MD

## 2015-09-08 NOTE — Progress Notes (Signed)
Discharge instructions given to daughter due to patient's baseline dementia. Education given on UTI and syncope as well as prescription med levaquin. IV and tele removed. Home health PT has been set up with care management. Questions answered at this time.

## 2015-09-09 LAB — URINE CULTURE: Culture: >=100000 — AB

## 2015-11-06 NOTE — Telephone Encounter (Signed)
x

## 2015-11-19 ENCOUNTER — Ambulatory Visit
Admission: RE | Admit: 2015-11-19 | Discharge: 2015-11-19 | Disposition: A | Discharge status: Home / Self Care | Payer: Medicare Other | Source: Ambulatory Visit | Attending: Radiation Oncology | Admitting: Radiation Oncology

## 2015-11-19 ENCOUNTER — Encounter: Payer: Self-pay | Admitting: Radiation Oncology

## 2015-11-19 VITALS — BP 133/79 | HR 66 | Temp 96.9°F

## 2015-11-19 DIAGNOSIS — Z8542 Personal history of malignant neoplasm of other parts of uterus: Secondary | ICD-10-CM | POA: Insufficient documentation

## 2015-11-19 DIAGNOSIS — F0390 Unspecified dementia without behavioral disturbance: Secondary | ICD-10-CM | POA: Insufficient documentation

## 2015-11-19 DIAGNOSIS — R55 Syncope and collapse: Secondary | ICD-10-CM | POA: Diagnosis not present

## 2015-11-19 DIAGNOSIS — Z923 Personal history of irradiation: Secondary | ICD-10-CM | POA: Diagnosis not present

## 2015-11-19 DIAGNOSIS — I2510 Atherosclerotic heart disease of native coronary artery without angina pectoris: Secondary | ICD-10-CM | POA: Insufficient documentation

## 2015-11-19 DIAGNOSIS — C541 Malignant neoplasm of endometrium: Secondary | ICD-10-CM

## 2015-11-19 NOTE — Progress Notes (Signed)
.  Radiation Oncology Follow up Note  Name: Sara Heath   Date:   11/19/2015 MRN:  [id number] DOB: 11-May-1921    This 82 y.o. female presents to the clinic today for follow-up for locally advanced endometrial carcinoma now out 5 months.  REFERRING PROVIDER: Petra Kuba, MD  HPI: Patient is a 82 year old female now out 5 months having completed radiation therapy both external beam and brachytherapy for locally advanced endometrial carcinoma presenting with a 10.5 cm poorly differentiated endometrioid adenocarcinoma. She is seen today in routine follow-up and is doing well. She is with her daughter. She specifically denies diarrhea dysuria or any blood per vagina.. She is doing followed by cardiology for syncope also under close follow-up care by GYN oncology. Patient has significant comorbidities including dementia and cardio vascular disease.  COMPLICATIONS OF TREATMENT: none  FOLLOW UP COMPLIANCE: keeps appointments   PHYSICAL EXAM:  BP 133/79 (BP Location: Left Arm)   Pulse 66   Temp (!) 96.9 F (36.1 C)  Well-developed elderly female in NAD with obvious dementia. She does have a small firm left cervical lymph node Well-developed well-nourished patient in NAD. HEENT reveals PERLA, EOMI, discs not visualized.  Oral cavity is clear. No oral mucosal lesions are identified. Neck is clear without evidence of cervical or supraclavicular adenopathy. Lungs are clear to A&P. Cardiac examination is essentially unremarkable with regular rate and rhythm without murmur rub or thrill. Abdomen is benign with no organomegaly or masses noted. Motor sensory and DTR levels are equal and symmetric in the upper and lower extremities. Cranial nerves II through XII are grossly intact. Proprioception is intact. No peripheral adenopathy or edema is identified. No motor or sensory levels are noted. Crude visual fields are within normal range.  RADIOLOGY RESULTS: No current films for review  PLAN: Present  time patient is stable no obvious disease on last pelvic examination. Does have a cervical node not that concerned about at this time. Based on her age dementia other comorbidities I'm turning follow-up care over to GYN oncology. We'll be happy to reevaluate the patient any time should further radiation oncology opinion be indicated.  I would like to take this opportunity to thank you for allowing me to participate in the care of your patient.Armstead Peaks., MD

## 2015-11-25 NOTE — Progress Notes (Signed)
  Oncology Nurse Navigator Documentation  Navigator Location: [facility] (11/25/15 1400) Navigator Encounter Type: Other (11/25/15 1400)                                          Time Spent with Patient: 15 (11/25/15 1400)   Spoke with Dr Theora Gianotti regarding follow up and previous PET ordered. No PET at this time. She needs to follow up in September with Dr Theora Gianotti. Message sent to scheduling.

## 2016-01-06 ENCOUNTER — Ambulatory Visit: Payer: Medicare Other

## 2016-01-13 ENCOUNTER — Inpatient Hospital Stay: Payer: Medicare Other | Attending: Obstetrics and Gynecology | Admitting: Obstetrics and Gynecology

## 2016-01-13 ENCOUNTER — Encounter: Payer: Self-pay | Admitting: Obstetrics and Gynecology

## 2016-01-13 DIAGNOSIS — I10 Essential (primary) hypertension: Secondary | ICD-10-CM | POA: Diagnosis not present

## 2016-01-13 DIAGNOSIS — J45909 Unspecified asthma, uncomplicated: Secondary | ICD-10-CM | POA: Insufficient documentation

## 2016-01-13 DIAGNOSIS — Z7982 Long term (current) use of aspirin: Secondary | ICD-10-CM | POA: Diagnosis not present

## 2016-01-13 DIAGNOSIS — R634 Abnormal weight loss: Secondary | ICD-10-CM | POA: Diagnosis not present

## 2016-01-13 DIAGNOSIS — R600 Localized edema: Secondary | ICD-10-CM | POA: Insufficient documentation

## 2016-01-13 DIAGNOSIS — Z923 Personal history of irradiation: Secondary | ICD-10-CM | POA: Diagnosis not present

## 2016-01-13 DIAGNOSIS — G8929 Other chronic pain: Secondary | ICD-10-CM | POA: Insufficient documentation

## 2016-01-13 DIAGNOSIS — G4730 Sleep apnea, unspecified: Secondary | ICD-10-CM | POA: Diagnosis not present

## 2016-01-13 DIAGNOSIS — R05 Cough: Secondary | ICD-10-CM | POA: Diagnosis not present

## 2016-01-13 DIAGNOSIS — L292 Pruritus vulvae: Secondary | ICD-10-CM | POA: Diagnosis not present

## 2016-01-13 DIAGNOSIS — Z79899 Other long term (current) drug therapy: Secondary | ICD-10-CM

## 2016-01-13 DIAGNOSIS — M109 Gout, unspecified: Secondary | ICD-10-CM | POA: Insufficient documentation

## 2016-01-13 DIAGNOSIS — F0390 Unspecified dementia without behavioral disturbance: Secondary | ICD-10-CM | POA: Insufficient documentation

## 2016-01-13 DIAGNOSIS — C541 Malignant neoplasm of endometrium: Secondary | ICD-10-CM | POA: Diagnosis not present

## 2016-01-13 DIAGNOSIS — R591 Generalized enlarged lymph nodes: Secondary | ICD-10-CM | POA: Insufficient documentation

## 2016-01-13 DIAGNOSIS — I2510 Atherosclerotic heart disease of native coronary artery without angina pectoris: Secondary | ICD-10-CM | POA: Insufficient documentation

## 2016-01-13 NOTE — Progress Notes (Signed)
Patient here for follow up. Daughter states "no new issues" no complaints of pain or discomfort today.

## 2016-01-13 NOTE — Progress Notes (Signed)
Gynecologic Oncology Consult Visit   Referring Provider: Dr. Laverta Baltimore  Chief Concern: endometrial cancer  Subjective:  Sara Heath is a 82 y.o. female who is seen in consultation from Dr. Ouida Sills for poorly differentiated endometrial cancer. Of note Sara Heath has dementia and is not capable of making her own decisions. She does not have HCPOA, but she does have a very supportive family.   Presents today for surveillance. She has no complaints of vaginal bleeding. At her last visit she was noted to have an enlarged cervical node. PET was ordered but not scheduled by patient.     Gynecologic Oncology  Sara Heath presented with abnormal vaginal bleeding. She had a pelvic ultrasound on 12/07/2014 that revealed the following:   Measurements: Uterus 10.5 x 3.9 x 6.7 cm. Multiple calcifications are noted throughout the uterus. These are similar to that seen on a prior CT examination. These are not felt to represent uterine fibroids but more likely parenchymal calcification.  Endometrium  Thickness: 7.7 mm.  Right ovary Not well visualized Left ovary Measurements: 1.8 x 0.8 x 1.3 cm.   EMBx poorly differentiated endometrioid adenocarcinoma  12/22/2014  CXR: No acute cardiopulmonary disease. CT A/P: IMPRESSION: 1. Similar to prior CT scan there is a large volume of sludge and perhaps numerous small calculi within the gallbladder. There is mild but progressive dilatation of the extrahepatic and intrahepatic bile ducts as well as of the pancreatic duct. Although there is no pancreatic head mass, consider MRCP to further investigate. 2. Small adrenal nodules too small to characterize. Adrenal metastasis would be an unusual manifestation that endometrial cancer, but the possibility is not excluded. 3. Air within the endometrial canal presumably due to recent instrumentation 4. 8 mm retroperitoneal periaortic lymph node. Other smaller pelvic lymph nodes as described  above. 5. Staghorn calculus left kidney   I previously contacted Sara Heath care provider, [name] [phone], regarding her CXR and CT scan. There is no definitive evidence of cancer. On CT scan she did have a 8 mm PA node which is not grossly enlarged. I recommended a cardiology consult for clearance and she saw Dr. Venetia Maxon at [facility]. She was seen by Dr. Alycia Rossetti on 02/13/2015. I spoke with him personally. She has a 5% risk of MI and at least a 5% risk of cardiac failure. No further testing was done as he did not feel it would offer further valuable information. We recommended radiation therapy.    She received external beam radiation therapy 05/2015. She was seen at [facility] for consideration of brachytherapy and that was declined.    Past Medical History: Past Medical History:  Diagnosis Date  . Angioedema   . Asthma   . Breast mass, left   . CAD (coronary artery disease)   . Chronic back pain   . Chronic headache   . Dementia   . Dementia   . Edema of both legs   . Endometrial cancer ([place])   . Gout   . Hematuria   . Hemorrhagic cystitis   . Hypertension   . Malaise and fatigue   . Sleep apnea   . Torticollis   . UTI (lower urinary tract infection)   . Vaginal candidiasis   . Vulvar dystrophy   . Weight loss     Past Surgical History: Past Surgical History:  Procedure Laterality Date  . CORONARY ANGIOPLASTY WITH STENT PLACEMENT    . left breast biopsy    . tubal ligation  Past Gynecologic History:  See HPI  OB History:  OB History  Gravida Para Term Preterm AB SAB TAB Ectopic Multiple Living  11 8            Family History: Family History  Problem Relation Age of Onset  . Family history unknown: Yes    Social History: Social History   Social History  . Marital status: Widowed    Spouse name: N/A  . Number of children: N/A  . Years of education: N/A   Occupational History  . Not on file.   Social History Main Topics  . Smoking  status: Never Smoker  . Smokeless tobacco: Never Used  . Alcohol use No  . Drug use: Unknown  . Sexual activity: Not on file   Other Topics Concern  . Not on file   Social History Narrative  . No narrative on file    Allergies: Allergies  Allergen Reactions  . Penicillins Anaphylaxis    Has patient had a PCN reaction causing immediate rash, facial/tongue/throat swelling, SOB or lightheadedness with hypotension: Yes Has patient had a PCN reaction causing severe rash involving mucus membranes or skin necrosis: No Has patient had a PCN reaction that required hospitalization No Has patient had a PCN reaction occurring within the last 10 years: No If all of the above answers are "NO", then may proceed with Cephalosporin use.    Current Medications: Current Outpatient Prescriptions  Medication Sig Dispense Refill  . aspirin EC 81 MG tablet Take 81 mg by mouth daily.     . cetirizine (ZYRTEC) 10 MG tablet Take 10 mg by mouth daily as needed for allergies.     Marland Kitchen colchicine 0.6 MG tablet take 1 tablet by mouth once daily for PREVENT GOUT take 2 tablets...  (REFER TO PRESCRIPTION NOTES).  0  . docusate sodium (COLACE) 100 MG capsule Take by mouth.    . furosemide (LASIX) 40 MG tablet Take 40 mg by mouth every other day.     . isosorbide mononitrate (IMDUR) 120 MG 24 hr tablet Take 120 mg by mouth daily.     . metoprolol succinate (TOPROL-XL) 50 MG 24 hr tablet Take 50 mg by mouth daily.     . nitroGLYCERIN (NITROSTAT) 0.4 MG SL tablet Place 0.4 mg under the tongue every 5 (five) minutes x 3 doses as needed for chest pain. *if not resolved , go to emergency department.*    . RA COL-RITE 100 MG capsule take 100 mg by mouth twice a day if needed for STOOL SOFTENING  0   No current facility-administered medications for this visit.     Review of Systems   General: weight loss, weakness  HEENT: no complaints  Lungs: cough  Cardiac: negative  GI: negative  GU: negative other than  vulvar itching  Musculoskeletal: no complaints  Extremities: chronic leg swelling  Skin: negative  Neuro: negative  Endocrine: no complaints  Psych: no complaints        Objective:  Physical Examination:  BP 131/71 (BP Location: Left Arm, Patient Position: Sitting)   Pulse 66   Temp 97 F (36.1 C) (Tympanic)   Ht 5\' 3"  (1.6 m)   Wt 131 lb 6.3 oz (59.6 kg)   BMI 23.28 kg/m      ECOG Performance Status: 3 - Symptomatic, >50% confined to bed or chair  General appearance: appears stated age and no distress, cooperative but non interactive HEENT:PERRLA and sclera clear, anicteric  Lymph node survey: Firm  fixed 1 cm left cervical lymph node which is unchanged; no obvious supraclavicular, axillary or inguinal nodes.  CV: RRR Lungs: CTA Abdomen: soft, non-tender, without masses or organomegaly and no hernias and no ascites.  Back: inspection of back is normal Extremities: edema bilateral and symmetrical. Per patient family she has chronic edema  Skin: pigmentation changes in radiation field.  Neurological exam reveals Patient follows commands and shook my hand; she did not engage in the conversation.  Pelvic: exam chaperoned by nurse;  Vulva: normal appearing vulva with no masses, tenderness or lesions; Vagina: normal vagina; Adnexa: nontender and no masses; Uterus: unable to determine size, not grossly enlarged and nontender; Cervix: unable to visualize fully. Deviated posteriorly. No abnormalities on palpation. Parametria was smooth bilaterally; Rectal: not indicated    Lab Review N/A   Radiologic Imaging: N/A    Assessment:  MEILING TOOKS is a 82 y.o. female diagnosed with grade 3 endometrial cancer, non-operable candidate, s/p external beam radiation therapy. Enlarged firm cervical node on exam, stable. History of  8 mm retroperitoneal periaortic lymph node, which is within normal limits. Medical co-morbidities complicating care: Dementia, frailty, and CAD. Plan:       Problem List Items Addressed This Visit    Endometrial cancer        Right cervical adenopathy  Visit Diagnoses    Endometrial cancer    -  Primary    Dementia       I discussed the exam findings with the patient and her daughter. The cervical mass is stable so I do not think this is disease. I also did not recommend a CT scan to follow up the PA node as her mother is very frail and I do not think she could tolerate therapy if she did have a recurrence. I recommend our focus be on quality of life and symptom management, currently her mother is NED. She agreed with this plan. She will follow up and schedule appointment with Dr. Laverta Baltimore in 6 months and then return to our clinic in 12 months.    Gillis Ends, MD     CC:  Dr. Laverta Baltimore

## 2016-01-13 NOTE — Patient Instructions (Signed)
Please schedule appointment with Dr. Laverta Baltimore in 6 months and then return to Dr. Theora Gianotti in 12 months.

## 2016-01-13 NOTE — Progress Notes (Signed)
  Oncology Nurse Navigator Documentation Chaperoned pelvic exam. Instructed on follow with daughter. 6 months with Dr Ouida Sills and 1 yr with Gyn Onc, appt made before she left Navigator Location: [facility] (01/13/16 1100) Navigator Encounter Type: Clinic/MDC;Follow-up Appt (01/13/16 1100)                                          Time Spent with Patient: 15 (01/13/16 1100)

## 2016-02-03 ENCOUNTER — Ambulatory Visit: Payer: Medicare Other

## 2016-03-15 ENCOUNTER — Other Ambulatory Visit: Payer: Self-pay | Admitting: Family Medicine

## 2016-03-15 DIAGNOSIS — M7989 Other specified soft tissue disorders: Principal | ICD-10-CM

## 2016-03-15 DIAGNOSIS — M79662 Pain in left lower leg: Secondary | ICD-10-CM

## 2016-03-16 ENCOUNTER — Ambulatory Visit
Admission: RE | Admit: 2016-03-16 | Discharge: 2016-03-16 | Disposition: A | Discharge status: Home / Self Care | Payer: Medicare Other | Source: Ambulatory Visit | Attending: Family Medicine | Admitting: Family Medicine

## 2016-03-16 DIAGNOSIS — M7122 Synovial cyst of popliteal space [Baker], left knee: Secondary | ICD-10-CM | POA: Diagnosis not present

## 2016-03-16 DIAGNOSIS — M79662 Pain in left lower leg: Secondary | ICD-10-CM

## 2016-03-16 DIAGNOSIS — M79605 Pain in left leg: Secondary | ICD-10-CM | POA: Insufficient documentation

## 2016-03-16 DIAGNOSIS — M7989 Other specified soft tissue disorders: Secondary | ICD-10-CM

## 2016-05-02 ENCOUNTER — Emergency Department: Payer: Medicare Other

## 2016-05-02 ENCOUNTER — Emergency Department
Admission: EM | Admit: 2016-05-02 | Discharge: 2016-05-03 | Disposition: A | Discharge status: Home / Self Care | Payer: Medicare Other | Attending: Emergency Medicine | Admitting: Emergency Medicine

## 2016-05-02 ENCOUNTER — Encounter: Payer: Self-pay | Admitting: *Deleted

## 2016-05-02 DIAGNOSIS — J181 Lobar pneumonia, unspecified organism: Secondary | ICD-10-CM | POA: Diagnosis not present

## 2016-05-02 DIAGNOSIS — R079 Chest pain, unspecified: Secondary | ICD-10-CM

## 2016-05-02 DIAGNOSIS — Z9861 Coronary angioplasty status: Secondary | ICD-10-CM | POA: Insufficient documentation

## 2016-05-02 DIAGNOSIS — I2510 Atherosclerotic heart disease of native coronary artery without angina pectoris: Secondary | ICD-10-CM | POA: Diagnosis not present

## 2016-05-02 DIAGNOSIS — I10 Essential (primary) hypertension: Secondary | ICD-10-CM | POA: Diagnosis not present

## 2016-05-02 DIAGNOSIS — Z7982 Long term (current) use of aspirin: Secondary | ICD-10-CM | POA: Insufficient documentation

## 2016-05-02 DIAGNOSIS — Z79899 Other long term (current) drug therapy: Secondary | ICD-10-CM | POA: Diagnosis not present

## 2016-05-02 DIAGNOSIS — J45909 Unspecified asthma, uncomplicated: Secondary | ICD-10-CM | POA: Insufficient documentation

## 2016-05-02 DIAGNOSIS — J189 Pneumonia, unspecified organism: Secondary | ICD-10-CM

## 2016-05-02 DIAGNOSIS — R0789 Other chest pain: Secondary | ICD-10-CM | POA: Diagnosis present

## 2016-05-02 LAB — CBC
HCT: 38 % (ref 35.0–47.0)
Hemoglobin: 12.6 g/dL (ref 12.0–16.0)
MCH: 30.9 pg (ref 26.0–34.0)
MCHC: 33 g/dL (ref 32.0–36.0)
MCV: 93.7 fL (ref 80.0–100.0)
PLATELETS: 236 10*3/uL (ref 150–440)
RBC: 4.06 MIL/uL (ref 3.80–5.20)
RDW: 15.4 % — ABNORMAL HIGH (ref 11.5–14.5)
WBC: 5 10*3/uL (ref 3.6–11.0)

## 2016-05-02 LAB — BASIC METABOLIC PANEL
Anion gap: 6 (ref 5–15)
BUN: 16 mg/dL (ref 6–20)
CHLORIDE: 102 mmol/L (ref 101–111)
CO2: 29 mmol/L (ref 22–32)
CREATININE: 0.87 mg/dL (ref 0.44–1.00)
Calcium: 9.9 mg/dL (ref 8.9–10.3)
GFR, EST NON AFRICAN AMERICAN: 56 mL/min — AB (ref >60)
Glucose, Bld: 109 mg/dL — ABNORMAL HIGH (ref 65–99)
Potassium: 4.1 mmol/L (ref 3.5–5.1)
SODIUM: 137 mmol/L (ref 135–145)

## 2016-05-02 LAB — TROPONIN I

## 2016-05-02 MED ORDER — AZITHROMYCIN 500 MG PO TABS
500.0000 mg | ORAL_TABLET | Freq: Once | ORAL | Status: AC
  Administered 2016-05-02: 500 mg via ORAL
  Filled 2016-05-02: qty 1

## 2016-05-02 NOTE — ED Triage Notes (Signed)
Pt brought to triage via wheelchair.  Pt had chest pain while watching tv.  Pt took 2 ntg with some relief.  No sob.  Nonsmoker.  Pt alert.

## 2016-05-02 NOTE — ED Notes (Signed)
Saltines and apple juice were given to patient per family request by Kyra Manges.

## 2016-05-03 MED ORDER — AZITHROMYCIN 250 MG PO TABS: ORAL_TABLET | ORAL | 0 refills | Status: AC

## 2016-05-03 NOTE — Discharge Instructions (Signed)
Please follow up with her primary care physician in the next 1-2 days for further evaluation of this chest pain. At this time the workup is negative. Please return to the emergency department with any return of her symptoms or any worsening symptoms.

## 2016-05-03 NOTE — ED Provider Notes (Signed)
[facility] Emergency Department Provider Note   ____________________________________________   First MD Initiated Contact with Patient 05/02/16 2316     (approximate)  I have reviewed the triage vital signs and the nursing notes.   HISTORY  Chief Complaint Chest Pain    HPI Sara Heath is a 83 y.o. female who comes into the hospital today with some tightness in her chest. The patient took 2 nitroglycerin and wasn't still feeling well so she came into the hospital. This started around 2 PM and the patient was watching television when it started. The patient reports that she had some shortness of breath and felt sweaty and nauseous. She reports that it seemed to go under her breasts. She reports that currently the pain is gone. She didn't take any other medicines for the pain. She denies any other current symptoms. The patient reports that she had a mild nonproductive cough but she denies any fevers at home. She was brought in by her family for evaluation today.   Past Medical History:  Diagnosis Date  . Angioedema   . Asthma   . Breast mass, left   . CAD (coronary artery disease)   . Chronic back pain   . Chronic headache   . Dementia   . Dementia   . Edema of both legs   . Endometrial cancer ([place])   . Gout   . Hematuria   . Hemorrhagic cystitis   . Hypertension   . Malaise and fatigue   . Sleep apnea   . Torticollis   . UTI (lower urinary tract infection)   . Vaginal candidiasis   . Vulvar dystrophy   . Weight loss     Patient Active Problem List   Diagnosis Date Noted  . Malnutrition of moderate degree 09/08/2015  . Pancytopenia ([place]) 09/08/2015  . Acute cystitis with hematuria 09/08/2015  . General weakness 09/08/2015  . Dementia 09/08/2015  . Syncope 09/06/2015  . CAD (coronary artery disease) 09/06/2015  . HTN, goal below 140/80 09/06/2015  . History of endometrial cancer 09/06/2015    Past Surgical History:  Procedure  Laterality Date  . CORONARY ANGIOPLASTY WITH STENT PLACEMENT    . left breast biopsy    . tubal ligation      Prior to Admission medications   Medication Sig Start Date End Date Taking? Authorizing Provider  aspirin EC 81 MG tablet Take 81 mg by mouth daily.    Yes Historical Provider, MD  cetirizine (ZYRTEC) 10 MG tablet Take 10 mg by mouth daily as needed for allergies.  11/26/14  Yes Historical Provider, MD  colchicine 0.6 MG tablet take 1 tablet by mouth once daily for PREVENT GOUT take 2 tablets...  (REFER TO PRESCRIPTION NOTES). 07/03/15  Yes Historical Provider, MD  furosemide (LASIX) 40 MG tablet Take 40 mg by mouth every other day.    Yes Historical Provider, MD  isosorbide mononitrate (IMDUR) 120 MG 24 hr tablet Take 120 mg by mouth daily.  11/13/14  Yes Historical Provider, MD  metoprolol succinate (TOPROL-XL) 50 MG 24 hr tablet Take 50 mg by mouth daily.  11/26/14  Yes Historical Provider, MD  nitroGLYCERIN (NITROSTAT) 0.4 MG SL tablet Place 0.4 mg under the tongue every 5 (five) minutes x 3 doses as needed for chest pain. *if not resolved , go to emergency department.*   Yes Historical Provider, MD  RA COL-RITE 100 MG capsule take 100 mg by mouth twice a day if needed for  STOOL SOFTENING 10/01/14  Yes Historical Provider, MD  azithromycin (ZITHROMAX Z-PAK) 250 MG tablet Take 2 tablets (500 mg) on  Day 1,  followed by 1 tablet (250 mg) once daily on Days 2 through 5. 05/03/16 05/08/16  Loney Hering, MD  cefUROXime (CEFTIN) 500 MG tablet Take 500 mg by mouth 2 (two) times daily with a meal.    Historical Provider, MD    Allergies Penicillins  Family History  Problem Relation Age of Onset  . Family history unknown: Yes    Social History Social History  Substance Use Topics  . Smoking status: Never Smoker  . Smokeless tobacco: Never Used  . Alcohol use No    Review of Systems Constitutional: No fever/chills Eyes: No visual changes. ENT: No sore throat. Cardiovascular:   chest pain. Respiratory: shortness of breath. Gastrointestinal: Nausea with No abdominal pain, no vomiting.  No diarrhea.  No constipation. Genitourinary: Negative for dysuria. Musculoskeletal: Negative for back pain. Skin: Negative for rash. Neurological: Negative for headaches, focal weakness or numbness.  10-point ROS otherwise negative.  ____________________________________________   PHYSICAL EXAM:  VITAL SIGNS: ED Triage Vitals  Enc Vitals Group     BP 05/02/16 2212 (!) 149/82     Pulse Rate 05/02/16 2212 98     Resp 05/02/16 2212 17     Temp --      Temp src --      SpO2 05/02/16 2216 98 %     Weight --      Height --      Head Circumference --      Peak Flow --      Pain Score 05/02/16 1602 5     Pain Loc --      Pain Edu? --      Excl. in [place]? --     Constitutional: Alert and oriented. Well appearing and in no acute distress. Eyes: Conjunctivae are normal. PERRL. EOMI. Head: Atraumatic. Nose: No congestion/rhinnorhea. Mouth/Throat: Mucous membranes are moist.  Oropharynx non-erythematous. Cardiovascular: Normal rate, regular rhythm. Grossly normal heart sounds.  Good peripheral circulation. Respiratory: Normal respiratory effort.  No retractions. Lungs CTAB. Gastrointestinal: Soft and nontender. No distention. Positive bowel sounds Musculoskeletal: No lower extremity tenderness nor edema.   Neurologic:  Normal speech and language.  Skin:  Skin is warm, dry and intact.  Psychiatric: Mood and affect are normal.   ____________________________________________   LABS (all labs ordered are listed, but only abnormal results are displayed)  Labs Reviewed  BASIC METABOLIC PANEL - Abnormal; Notable for the following:       Result Value   Glucose, Bld 109 (*)    GFR calc non Af Amer 56 (*)    All other components within normal limits  CBC - Abnormal; Notable for the following:    RDW 15.4 (*)    All other components within normal limits  TROPONIN I  TROPONIN  I   ____________________________________________  EKG  ED ECG REPORT I, Loney Hering, the attending physician, personally viewed and interpreted this ECG.   Date: 05/02/2016  EKG Time: 1605  Rate: 70  Rhythm: normal sinus rhythm  Axis: normal  Intervals:none  ST&T Change: none  ____________________________________________  RADIOLOGY  CXR ____________________________________________   PROCEDURES  Procedure(s) performed: None  Procedures  Critical Care performed: No  ____________________________________________   INITIAL IMPRESSION / ASSESSMENT AND PLAN / ED COURSE  Pertinent labs & imaging results that were available during my care of the patient were reviewed  by me and considered in my medical decision making (see chart for details).  This is a 83 year old female who comes into the hospital today with some chest pain. The patient had this pain multiple hours ago and it has since subsided. The patient's blood work looks well and she had 2 troponins that were negative. The patient does have a density in her left lung base which could've been the cause of her chest pain. I will give the patient a dose of azithromycin. Her heart rate is unremarkable, her respiratory rate is unremarkable and her oxygen saturations are unremarkable. I feel that the patient can be discharged home with some antibiotics. I did discuss with the family member that she would need to see her doctor in the next day or 2 for further evaluation and they did report that that was possible. I will discharge the patient to home and have her follow-up with her primary care physician.  Clinical Course as of May 03 12  Mon May 02, 2016  2311 Patchy interstitial density at the left lung base likely in the lower lobe is consistent with atelectasis or pneumonia. No CHF. Followup PA and lateral chest X-ray is recommended in 3-4 weeks following trial of antibiotic therapy to ensure resolution  and exclude underlying malignancy.  Thoracic aortic atherosclerosis.   DG Chest 2 View [AW]    Clinical Course User Index [AW] Loney Hering, MD     ____________________________________________   FINAL CLINICAL IMPRESSION(S) / ED DIAGNOSES  Final diagnoses:  Chest pain, unspecified type  Community acquired pneumonia of left lower lobe of lung ([place])      NEW MEDICATIONS STARTED DURING THIS VISIT:  New Prescriptions   AZITHROMYCIN (ZITHROMAX Z-PAK) 250 MG TABLET    Take 2 tablets (500 mg) on  Day 1,  followed by 1 tablet (250 mg) once daily on Days 2 through 5.     Note:  This document was prepared using Dragon voice recognition software and may include unintentional dictation errors.    Loney Hering, MD 05/03/16 [phone]

## 2016-05-03 NOTE — ED Notes (Signed)
Pt discharged to home.  Family member driving.  Discharge instructions reviewed.  Verbalized understanding.  No questions or concerns at this time.  Teach back verified.  Pt in NAD.  No items left in ED.   

## 2016-05-17 ENCOUNTER — Observation Stay
Admission: EM | Admit: 2016-05-17 | Discharge: 2016-05-18 | Disposition: A | Discharge status: Home / Self Care | Payer: Medicare Other | Attending: Internal Medicine | Admitting: Internal Medicine

## 2016-05-17 ENCOUNTER — Emergency Department: Payer: Medicare Other

## 2016-05-17 DIAGNOSIS — R262 Difficulty in walking, not elsewhere classified: Secondary | ICD-10-CM

## 2016-05-17 DIAGNOSIS — Z79899 Other long term (current) drug therapy: Secondary | ICD-10-CM | POA: Diagnosis not present

## 2016-05-17 DIAGNOSIS — F0390 Unspecified dementia without behavioral disturbance: Secondary | ICD-10-CM | POA: Diagnosis not present

## 2016-05-17 DIAGNOSIS — M109 Gout, unspecified: Secondary | ICD-10-CM | POA: Diagnosis not present

## 2016-05-17 DIAGNOSIS — E785 Hyperlipidemia, unspecified: Secondary | ICD-10-CM | POA: Insufficient documentation

## 2016-05-17 DIAGNOSIS — I10 Essential (primary) hypertension: Secondary | ICD-10-CM | POA: Diagnosis not present

## 2016-05-17 DIAGNOSIS — R079 Chest pain, unspecified: Secondary | ICD-10-CM | POA: Diagnosis not present

## 2016-05-17 DIAGNOSIS — M6281 Muscle weakness (generalized): Secondary | ICD-10-CM

## 2016-05-17 DIAGNOSIS — Z8542 Personal history of malignant neoplasm of other parts of uterus: Secondary | ICD-10-CM | POA: Diagnosis not present

## 2016-05-17 DIAGNOSIS — G4730 Sleep apnea, unspecified: Secondary | ICD-10-CM | POA: Diagnosis not present

## 2016-05-17 DIAGNOSIS — Z8674 Personal history of sudden cardiac arrest: Secondary | ICD-10-CM | POA: Diagnosis not present

## 2016-05-17 DIAGNOSIS — R001 Bradycardia, unspecified: Secondary | ICD-10-CM | POA: Diagnosis not present

## 2016-05-17 DIAGNOSIS — R110 Nausea: Secondary | ICD-10-CM | POA: Insufficient documentation

## 2016-05-17 DIAGNOSIS — R55 Syncope and collapse: Secondary | ICD-10-CM | POA: Diagnosis present

## 2016-05-17 DIAGNOSIS — Z7982 Long term (current) use of aspirin: Secondary | ICD-10-CM | POA: Diagnosis not present

## 2016-05-17 DIAGNOSIS — I2510 Atherosclerotic heart disease of native coronary artery without angina pectoris: Secondary | ICD-10-CM | POA: Diagnosis not present

## 2016-05-17 LAB — CBC
HCT: 35.5 % (ref 35.0–47.0)
Hemoglobin: 12 g/dL (ref 12.0–16.0)
MCH: 31.3 pg (ref 26.0–34.0)
MCHC: 33.8 g/dL (ref 32.0–36.0)
MCV: 92.5 fL (ref 80.0–100.0)
PLATELETS: 142 10*3/uL — AB (ref 150–440)
RBC: 3.84 MIL/uL (ref 3.80–5.20)
RDW: 15.1 % — AB (ref 11.5–14.5)
WBC: 3.5 10*3/uL — ABNORMAL LOW (ref 3.6–11.0)

## 2016-05-17 LAB — BASIC METABOLIC PANEL
Anion gap: 6 (ref 5–15)
BUN: 20 mg/dL (ref 6–20)
CALCIUM: 9.8 mg/dL (ref 8.9–10.3)
CO2: 28 mmol/L (ref 22–32)
CREATININE: 0.86 mg/dL (ref 0.44–1.00)
Chloride: 104 mmol/L (ref 101–111)
GFR calc Af Amer: >60 mL/min (ref >60)
GFR calc non Af Amer: 57 mL/min — ABNORMAL LOW (ref >60)
GLUCOSE: 98 mg/dL (ref 65–99)
Potassium: 3.6 mmol/L (ref 3.5–5.1)
Sodium: 138 mmol/L (ref 135–145)

## 2016-05-17 LAB — TROPONIN I

## 2016-05-17 LAB — BRAIN NATRIURETIC PEPTIDE: B Natriuretic Peptide: 40 pg/mL (ref 0.0–100.0)

## 2016-05-17 MED ORDER — ASPIRIN EC 81 MG PO TBEC
81.0000 mg | DELAYED_RELEASE_TABLET | Freq: Every day | ORAL | Status: DC
  Administered 2016-05-18: 81 mg via ORAL
  Filled 2016-05-17: qty 1

## 2016-05-17 MED ORDER — ACETAMINOPHEN 325 MG PO TABS: 650.0000 mg | ORAL_TABLET | Freq: Four times a day (QID) | ORAL | Status: DC | PRN

## 2016-05-17 MED ORDER — DOCUSATE SODIUM 100 MG PO CAPS
100.0000 mg | ORAL_CAPSULE | Freq: Two times a day (BID) | ORAL | Status: DC
  Administered 2016-05-18: 100 mg via ORAL
  Filled 2016-05-17: qty 1

## 2016-05-17 MED ORDER — SODIUM CHLORIDE 0.9 % IV SOLN
INTRAVENOUS | Status: DC
  Administered 2016-05-17: 23:00:00 via INTRAVENOUS

## 2016-05-17 MED ORDER — METOPROLOL TARTRATE 25 MG PO TABS
12.5000 mg | ORAL_TABLET | Freq: Two times a day (BID) | ORAL | Status: DC
  Administered 2016-05-18: 12.5 mg via ORAL
  Filled 2016-05-17: qty 1

## 2016-05-17 MED ORDER — ACETAMINOPHEN 650 MG RE SUPP: 650.0000 mg | Freq: Four times a day (QID) | RECTAL | Status: DC | PRN

## 2016-05-17 MED ORDER — ENOXAPARIN SODIUM 40 MG/0.4ML ~~LOC~~ SOLN
40.0000 mg | SUBCUTANEOUS | Status: DC
  Administered 2016-05-17: 40 mg via SUBCUTANEOUS
  Filled 2016-05-17: qty 0.4

## 2016-05-17 MED ORDER — ASPIRIN 81 MG PO CHEW
324.0000 mg | CHEWABLE_TABLET | Freq: Once | ORAL | Status: AC
  Administered 2016-05-17: 324 mg via ORAL
  Filled 2016-05-17: qty 4

## 2016-05-17 MED ORDER — NITROGLYCERIN 0.4 MG SL SUBL
0.4000 mg | SUBLINGUAL_TABLET | SUBLINGUAL | Status: DC | PRN
Start: 2016-05-17 — End: 2016-05-18

## 2016-05-17 MED ORDER — BISACODYL 5 MG PO TBEC: 5.0000 mg | DELAYED_RELEASE_TABLET | Freq: Every day | ORAL | Status: DC | PRN

## 2016-05-17 MED ORDER — ONDANSETRON HCL 4 MG PO TABS
4.0000 mg | ORAL_TABLET | Freq: Four times a day (QID) | ORAL | Status: DC | PRN
Start: 2016-05-17 — End: 2016-05-18

## 2016-05-17 MED ORDER — ONDANSETRON HCL 4 MG/2ML IJ SOLN: 4.0000 mg | Freq: Four times a day (QID) | INTRAMUSCULAR | Status: DC | PRN

## 2016-05-17 MED ORDER — COLCHICINE 0.6 MG PO TABS
0.6000 mg | ORAL_TABLET | Freq: Every day | ORAL | Status: DC
  Administered 2016-05-18: 0.6 mg via ORAL
  Filled 2016-05-17: qty 1

## 2016-05-17 MED ORDER — ISOSORBIDE MONONITRATE ER 60 MG PO TB24
120.0000 mg | ORAL_TABLET | Freq: Every day | ORAL | Status: DC
  Administered 2016-05-18: 120 mg via ORAL
  Filled 2016-05-17: qty 2

## 2016-05-17 NOTE — ED Triage Notes (Signed)
Pt arrives via [facility] from home with reports of mid stern al chest pain that began today  Pt with 2+ pitting edema present to bilateral lower extremities  EMS reports that family did not know to administer furosemide qd  Pt alert during triage/assessment

## 2016-05-17 NOTE — ED Provider Notes (Signed)
[facility] Emergency Department Provider Note  ____________________________________________  Time seen: Approximately 3:32 PM  I have reviewed the triage vital signs and the nursing notes.   HISTORY  Chief Complaint Chest Pain    HPI Sara Heath is a 83 y.o. female history of CAD status post multiple stent, cardiac arrest 5/17, dementia, presenting with chest pain and syncope. The history is limited due to the patient's dementia. The history that she gives is that since yesterday she has been having intermittent "sharp pressure" pain below the sternum with associated nausea but no vomiting. She is unable to describe how many times she has had these episodes, however the last for, but she does not attribute them to food or eating, position, or exertion. Her daughter reports that prior to arrival, they were watching television when she looked over and her mom's head was slumped down and she was unresponsive. Her daughter shook her, and after several seconds, she did become responsive. She then described chest pain, and was given one nitroglycerin at home by her family. At the beginning of my examination, the patient said she was still having chest pain, but then at the end of my examination when asked again, said she did not have any pain at all. No other recent illnesses, including fever, chills, nausea vomiting or diarrhea, shortness of breath.  At this time, the patient's daughter states that she is back to baseline. We had a discussion about the depth of diagnostics and potential interventions or therapeutics that the patient and the family would want, and at this time, they wanted "everything done."  Past Medical History:  Diagnosis Date  . Angioedema   . Asthma   . Breast mass, left   . CAD (coronary artery disease)   . Chronic back pain   . Chronic headache   . Dementia   . Dementia   . Edema of both legs   . Endometrial cancer ([place])   . Gout   .  Hematuria   . Hemorrhagic cystitis   . Hypertension   . Malaise and fatigue   . Sleep apnea   . Torticollis   . UTI (lower urinary tract infection)   . Vaginal candidiasis   . Vulvar dystrophy   . Weight loss     Patient Active Problem List   Diagnosis Date Noted  . Malnutrition of moderate degree 09/08/2015  . Pancytopenia ([place]) 09/08/2015  . Acute cystitis with hematuria 09/08/2015  . General weakness 09/08/2015  . Dementia 09/08/2015  . Syncope 09/06/2015  . CAD (coronary artery disease) 09/06/2015  . HTN, goal below 140/80 09/06/2015  . History of endometrial cancer 09/06/2015    Past Surgical History:  Procedure Laterality Date  . CORONARY ANGIOPLASTY WITH STENT PLACEMENT    . left breast biopsy    . tubal ligation      Current Outpatient Rx  . Order #: [id number] Class: Historical Med  . Order #: [id number] Class: Historical Med  . Order #: [id number] Class: Historical Med  . Order #: [id number] Class: Historical Med  . Order #: [id number] Class: Historical Med  . Order #: [id number] Class: Historical Med  . Order #: [id number] Class: Historical Med  . Order #: [id number] Class: Historical Med  . Order #: [id number] Class: Historical Med    Allergies Penicillins  Family History  Problem Relation Age of Onset  . Family history unknown: Yes    Social History Social History  Substance Use Topics  . Smoking status: Never Smoker  . Smokeless tobacco: Never  Used  . Alcohol use No    Review of Systems Constitutional: No fever/chills.Unknown lightheadedness. Positive syncope. Eyes: No visual changes. ENT: No sore throat. No congestion or rhinorrhea. Cardiovascular: Positive chest pain. Denies palpitations. Respiratory: Denies shortness of breath.  No cough. Gastrointestinal: No abdominal pain.  Positive nausea, no vomiting.  No diarrhea.  No constipation. Genitourinary: Negative for dysuria. Musculoskeletal: Negative for back pain. Intermittent lower extremity edema  over the last several months. Skin: Negative for rash. Neurological: Negative for headaches. No focal numbness, tingling or weakness.   10-point ROS otherwise negative.  ____________________________________________   PHYSICAL EXAM:  VITAL SIGNS: ED Triage Vitals  Enc Vitals Group     BP 05/17/16 1511 125/71     Pulse Rate 05/17/16 1511 69     Resp 05/17/16 1511 16     Temp 05/17/16 1511 98.1 F (36.7 C)     Temp Source 05/17/16 1511 Oral     SpO2 05/17/16 1511 98 %     Weight 05/17/16 1513 135 lb (61.2 kg)     Height 05/17/16 1513 5\' 2"  (1.575 m)     Head Circumference --      Peak Flow --      Pain Score --      Pain Loc --      Pain Edu? --      Excl. in [place]? --     Constitutional: Alert and orientedTo person and place but not time. Chronically ill-appearing and in no acute distress. Answers some questions appropriately. Eyes: Conjunctivae are normal.  EOMI. No scleral icterus. Positive arcus senilis Head: Atraumatic. Nose: No congestion/rhinnorhea. Mouth/Throat: Mucous membranes are moist.  Neck: No stridor.  Supple.  Positive JVD. Cardiovascular: Normal rate, regular rhythm. No murmurs, rubs or gallops.  Respiratory: Normal respiratory effort.  No accessory muscle use or retractions. Lungs CTAB.  No wheezes, rales or ronchi. Gastrointestinal: Soft, nontender and nondistended.  No guarding or rebound.  No peritoneal signs. Musculoskeletal: Positive bilateral symmetric pitting lower extremity edema to the proximal tibial shaft. No ttp in the calves or palpable cords.  Negative Homan's sign. Neurologic:  A&Ox2.  Speech is clear.  Face and smile are symmetric.  EOMI.  Moves all extremities well. Skin:  Skin is warm, dry and intact. No rash noted. Psychiatric: Mood and affect are normal. Speech and behavior are normal.  Normal judgement.  ____________________________________________   LABS (all labs ordered are listed, but only abnormal results are displayed)  Labs  Reviewed  BASIC METABOLIC PANEL - Abnormal; Notable for the following:       Result Value   GFR calc non Af Amer 57 (*)    All other components within normal limits  CBC - Abnormal; Notable for the following:    WBC 3.5 (*)    RDW 15.1 (*)    Platelets 142 (*)    All other components within normal limits  TROPONIN I  BRAIN NATRIURETIC PEPTIDE  URINALYSIS, COMPLETE (UACMP) WITH MICROSCOPIC   ____________________________________________  EKG  ED ECG REPORT I, Eula Listen, the attending physician, personally viewed and interpreted this ECG.   Date: 05/17/2016  EKG Time: 1456  Rate: 69  Rhythm: normal sinus rhythm  Axis: normal  Intervals:none  ST&T Change: No STEMI  ____________________________________________  RADIOLOGY  Dg Chest 2 View  Result Date: 05/17/2016 CLINICAL DATA:  Central chest pain, now resolved. EXAM: CHEST  2 VIEW COMPARISON:  05/02/2016 FINDINGS: The patient is again mildly rotated to the left. The cardiac  silhouette is upper limits of normal in size. Atherosclerotic calcification and tortuosity are again seen involving the thoracic aorta. Patchy left basilar opacity on the prior study has resolved. Minimal basilar predominant interstitial coarsening is similar to multiple older prior examinations. There is no evidence of acute airspace consolidation, edema, pleural effusion, or pneumothorax. There is gaseous distension of multiple small and large bowel loops in the visualized upper abdomen. The colon is interposed between the liver and right hemidiaphragm. Thoracic spondylosis and degenerative changes about the shoulders are noted. IMPRESSION: 1. No evidence of acute cardiopulmonary process. 2. Aortic atherosclerosis. Electronically Signed   By: Logan Bores M.D.   On: 05/17/2016 16:20    ____________________________________________   PROCEDURES  Procedure(s) performed: None  Procedures  Critical Care performed:  No ____________________________________________   INITIAL IMPRESSION / ASSESSMENT AND PLAN / ED COURSE  Pertinent labs & imaging results that were available during my care of the patient were reviewed by me and considered in my medical decision making (see chart for details).  83 y.o. female with a significant cardiac history presenting with chest pain and syncope. I am awaiting the patient's EKG to evaluate for ischemia or arrhythmia. At this time, the patient is hemodynamically stable and at baseline. We will be looking for arrhythmia, ACS or MI, hypovolemia, anemia, electrolyte abnormality, and UTI. Given that the family and the patient would consider aggressive care options, I'll we will plan to admit the patient for further evaluation and treatment.  ----------------------------------------- 6:23 PM on 05/17/2016 -----------------------------------------  The patient's workup in the emergency department is reassuring. Her troponin is negative, her EKG does not show ischemic changes in her chest x-ray does not show any acute cardiopulmonary process. I will plan to admit the patient to the hospitalist for further evaluation and treatment.  ____________________________________________  FINAL CLINICAL IMPRESSION(S) / ED DIAGNOSES  Final diagnoses:  Syncope, unspecified syncope type  Chest pain, unspecified type  Nausea without vomiting         NEW MEDICATIONS STARTED DURING THIS VISIT:  New Prescriptions   No medications on file      Eula Listen, MD 05/17/16 1824

## 2016-05-17 NOTE — H&P (Signed)
[place] [facility] [place] NAME: Sara Heath    MR#:  [id number]  DATE OF BIRTH:  20-Jun-1921  DATE OF ADMISSION:  05/17/2016  PRIMARY CARE PHYSICIAN: Petra Kuba, MD   REQUESTING/REFERRING PHYSICIAN: [place]   Chief Complaint  Patient presents with  . Chest Pain    HISTORY OF PRESENT ILLNESS:  Sara Heath  is a 83 y.o. female with a known history of htn,Dementia, essential hypertension comes in because of syncope. According to patient's daughter, patient was watching TV, suddenly noticed that mom's head was slumped down, unresponsive. Her daughter shook her after several seconds patient became responsive. And after that episode patient complained of chest pain. Family gave a dose of nitroglycerin. Patient has dementia and history is limited she is not able to tell me is that she had chest pain or not. But according to documentation patient follows up with [facility].  PAST MEDICAL HISTORY:   Past Medical History:  Diagnosis Date  . Angioedema   . Asthma   . Breast mass, left   . CAD (coronary artery disease)   . Chronic back pain   . Chronic headache   . Dementia   . Dementia   . Edema of both legs   . Endometrial cancer ([place])   . Gout   . Hematuria   . Hemorrhagic cystitis   . Hypertension   . Malaise and fatigue   . Sleep apnea   . Torticollis   . UTI (lower urinary tract infection)   . Vaginal candidiasis   . Vulvar dystrophy   . Weight loss     PAST SURGICAL HISTOIRY:   Past Surgical History:  Procedure Laterality Date  . CORONARY ANGIOPLASTY WITH STENT PLACEMENT    . left breast biopsy    . tubal ligation      SOCIAL HISTORY:   Social History  Substance Use Topics  . Smoking status: Never Smoker  . Smokeless tobacco: Never Used  . Alcohol use No    FAMILY HISTORY:   Family History  Problem Relation Age of Onset  . Family history unknown: Yes     DRUG ALLERGIES:   Allergies  Allergen Reactions  . Penicillins Anaphylaxis    Has patient had a PCN reaction causing immediate rash, facial/tongue/throat swelling, SOB or lightheadedness with hypotension: Yes Has patient had a PCN reaction causing severe rash involving mucus membranes or skin necrosis: No Has patient had a PCN reaction that required hospitalization No Has patient had a PCN reaction occurring within the last 10 years: No If all of the above answers are "NO", then may proceed with Cephalosporin use.    REVIEW OF SYSTEMS:  Unable to obtain due to dementia.  MEDICATIONS AT HOME:   Prior to Admission medications   Medication Sig Start Date End Date Taking? Authorizing Provider  aspirin EC 81 MG tablet Take 81 mg by mouth daily.     Historical Provider, MD  cefUROXime (CEFTIN) 500 MG tablet Take 500 mg by mouth 2 (two) times daily with a meal.    Historical Provider, MD  cetirizine (ZYRTEC) 10 MG tablet Take 10 mg by mouth daily as needed for allergies.  11/26/14   Historical Provider, MD  colchicine 0.6 MG tablet take 1 tablet by mouth once daily for PREVENT GOUT take 2 tablets...  (REFER TO PRESCRIPTION NOTES). 07/03/15   Historical Provider, MD  furosemide (LASIX) 40 MG tablet Take 40 mg by  mouth every other day.     Historical Provider, MD  isosorbide mononitrate (IMDUR) 120 MG 24 hr tablet Take 120 mg by mouth daily.  11/13/14   Historical Provider, MD  metoprolol succinate (TOPROL-XL) 50 MG 24 hr tablet Take 50 mg by mouth daily.  11/26/14   Historical Provider, MD  nitroGLYCERIN (NITROSTAT) 0.4 MG SL tablet Place 0.4 mg under the tongue every 5 (five) minutes x 3 doses as needed for chest pain. *if not resolved , go to emergency department.*    Historical Provider, MD  RA COL-RITE 100 MG capsule take 100 mg by mouth twice a day if needed for STOOL SOFTENING 10/01/14   Historical Provider, MD      VITAL SIGNS:  Blood pressure 115/74, pulse (!) 56, temperature 98.1 F  (36.7 C), temperature source Oral, resp. rate 14, height 5\' 2"  (1.575 m), weight 61.2 kg (135 lb), SpO2 99 %.  PHYSICAL EXAMINATION:  GENERAL:  83 y.o.-year-old patient lying in the bed with no acute distress.  EYES: Pupils equal, round, reactive to light and accommodation. No scleral icterus. Extraocular muscles intact.  HEENT: Head atraumatic, normocephalic. Oropharynx and nasopharynx clear.  NECK:  Supple, no jugular venous distention. No thyroid enlargement, no tenderness.  LUNGS: Normal breath sounds bilaterally, no wheezing, rales,rhonchi or crepitation. No use of accessory muscles of respiration.  CARDIOVASCULAR: S1, S2 normal. No murmurs, rubs, or gallops.  ABDOMEN: Soft, nontender, nondistended. Bowel sounds present. No organomegaly or mass.  EXTREMITIES: No pedal edema, cyanosis, or clubbing.  NEUROLOGIC: not following commands due to dementia SKIN: No obvious rash, lesion, or ulcer.   LABORATORY PANEL:   CBC  Recent Labs Lab 05/17/16 1512  WBC 3.5*  HGB 12.0  HCT 35.5  PLT 142*   ------------------------------------------------------------------------------------------------------------------  Chemistries   Recent Labs Lab 05/17/16 1512  NA 138  K 3.6  CL 104  CO2 28  GLUCOSE 98  BUN 20  CREATININE 0.86  CALCIUM 9.8   ------------------------------------------------------------------------------------------------------------------  Cardiac Enzymes  Recent Labs Lab 05/17/16 1512  TROPONINI <0.03   ------------------------------------------------------------------------------------------------------------------  RADIOLOGY:  Dg Chest 2 View  Result Date: 05/17/2016 CLINICAL DATA:  Central chest pain, now resolved. EXAM: CHEST  2 VIEW COMPARISON:  05/02/2016 FINDINGS: The patient is again mildly rotated to the left. The cardiac silhouette is upper limits of normal in size. Atherosclerotic calcification and tortuosity are again seen involving the  thoracic aorta. Patchy left basilar opacity on the prior study has resolved. Minimal basilar predominant interstitial coarsening is similar to multiple older prior examinations. There is no evidence of acute airspace consolidation, edema, pleural effusion, or pneumothorax. There is gaseous distension of multiple small and large bowel loops in the visualized upper abdomen. The colon is interposed between the liver and right hemidiaphragm. Thoracic spondylosis and degenerative changes about the shoulders are noted. IMPRESSION: 1. No evidence of acute cardiopulmonary process. 2. Aortic atherosclerosis. Electronically Signed   By: Logan Bores M.D.   On: 05/17/2016 16:20    EKG:   Orders placed or performed during the hospital encounter of 05/17/16  . EKG 12-Lead  . EKG 12-Lead  . ED EKG within 10 minutes  . ED EKG within 10 minutes  Normal sinus rhythm 69 bpm no ST T changes.  IMPRESSION AND PLAN:   #[age] year old female patient with history of coronary artery disease, stents before, history of cardiac arrest in May 2017, dementia, essential hypertension comes in from home because of syncopal episode. Admitted to hospitalist service for syncope, monitor  on telemetry for any  arrhythmias, patient heart rate is on the lower side bending from 69-59 showed decreased the dose of beta blocker. Patient takes Toprol XL 50 MG daily, changed to Toprol to Lopressor 12.5 mg by mouth twice a day. And check ultrasound of carotids, echocardiogram. Discussed with patient's daughter at length about workup and also CODE STATUS, patient's daughter wants full code. She denies any chest pain she is completely demented and not able to onset of my questions appropriately.alert but not oriented. 2/. Hyperlipidemia Essential hypertension CODE STATUS full code  All the records are reviewed and case discussed with ED provider. Management plans discussed with the patient, family and they are in agreement.  CODE STATUS:  full TOTAL TIME TAKING CARE OF THIS PATIENT: 25minutes.    Epifanio Lesches M.D on 05/17/2016 at 7:05 PM  Between 7am to 6pm - Pager - [phone]  After 6pm go to [email or web address] - password [email or web address] [facility] [place] [facility]  Office  [phone]  CC: Primary care physician; Petra Kuba, MD  Note: This dictation was prepared with Dragon dictation along with smaller phrase technology. Any transcriptional errors that result from this process are unintentional.

## 2016-05-18 ENCOUNTER — Observation Stay
Admit: 2016-05-18 | Discharge: 2016-05-18 | Disposition: A | Discharge status: Home / Self Care | Payer: Medicare Other | Attending: Internal Medicine | Admitting: Internal Medicine

## 2016-05-18 ENCOUNTER — Observation Stay: Payer: Medicare Other

## 2016-05-18 DIAGNOSIS — R001 Bradycardia, unspecified: Secondary | ICD-10-CM | POA: Diagnosis not present

## 2016-05-18 LAB — CBC
HEMATOCRIT: 32.2 % — AB (ref 35.0–47.0)
HEMOGLOBIN: 11 g/dL — AB (ref 12.0–16.0)
MCH: 31.5 pg (ref 26.0–34.0)
MCHC: 34.1 g/dL (ref 32.0–36.0)
MCV: 92.4 fL (ref 80.0–100.0)
Platelets: 141 10*3/uL — ABNORMAL LOW (ref 150–440)
RBC: 3.48 MIL/uL — ABNORMAL LOW (ref 3.80–5.20)
RDW: 15.1 % — AB (ref 11.5–14.5)
WBC: 2.7 10*3/uL — ABNORMAL LOW (ref 3.6–11.0)

## 2016-05-18 LAB — BASIC METABOLIC PANEL
ANION GAP: 5 (ref 5–15)
BUN: 18 mg/dL (ref 6–20)
CO2: 25 mmol/L (ref 22–32)
Calcium: 9.4 mg/dL (ref 8.9–10.3)
Chloride: 108 mmol/L (ref 101–111)
Creatinine, Ser: 0.69 mg/dL (ref 0.44–1.00)
GFR calc Af Amer: >60 mL/min (ref >60)
GFR calc non Af Amer: >60 mL/min (ref >60)
GLUCOSE: 93 mg/dL (ref 65–99)
POTASSIUM: 3.5 mmol/L (ref 3.5–5.1)
Sodium: 138 mmol/L (ref 135–145)

## 2016-05-18 LAB — ECHOCARDIOGRAM COMPLETE
Height: 62 in
Weight: 1947.2 oz

## 2016-05-18 LAB — GLUCOSE, CAPILLARY: Glucose-Capillary: 87 mg/dL (ref 65–99)

## 2016-05-18 MED ORDER — AMLODIPINE BESYLATE 5 MG PO TABS: 5.0000 mg | ORAL_TABLET | Freq: Every day | ORAL | 0 refills | Status: AC

## 2016-05-18 MED ORDER — AMLODIPINE BESYLATE 5 MG PO TABS: 5.0000 mg | ORAL_TABLET | Freq: Every day | ORAL | Status: DC

## 2016-05-18 NOTE — Care Management Obs Status (Signed)
[place] NOTIFICATION   Patient Details  Name: Sara Heath MRN: [id number] Date of Birth: [birth year]   Medicare Observation Status Notification Given:  Yes    Katrina Stack, RN 05/18/2016, 3:02 PM

## 2016-05-18 NOTE — Discharge Instructions (Signed)
Amlodipine tablets °What is this medicine? °AMLODIPINE (am LOE di peen) is a calcium-channel blocker. It affects the amount of calcium found in your heart and muscle cells. This relaxes your blood vessels, which can reduce the amount of work the heart has to do. This medicine is used to lower high blood pressure. It is also used to prevent chest pain. °This medicine may be used for other purposes; ask your health care provider or pharmacist if you have questions. °COMMON BRAND NAME(S): Norvasc °What should I tell my health care provider before I take this medicine? °They need to know if you have any of these conditions: °-heart problems like heart failure or aortic stenosis °-liver disease °-an unusual or allergic reaction to amlodipine, other medicines, foods, dyes, or preservatives °-pregnant or trying to get pregnant °-breast-feeding °How should I use this medicine? °Take this medicine by mouth with a glass of water. Follow the directions on the prescription label. Take your medicine at regular intervals. Do not take more medicine than directed. °Talk to your pediatrician regarding the use of this medicine in children. Special care may be needed. This medicine has been used in children as young as 6. °Persons over 65 years old may have a stronger reaction to this medicine and need smaller doses. °Overdosage: If you think you have taken too much of this medicine contact a poison control center or emergency room at once. °NOTE: This medicine is only for you. Do not share this medicine with others. °What if I miss a dose? °If you miss a dose, take it as soon as you can. If it is almost time for your next dose, take only that dose. Do not take double or extra doses. °What may interact with this medicine? °-herbal or dietary supplements °-local or general anesthetics °-medicines for high blood pressure °-medicines for prostate problems °-rifampin °This list may not describe all possible interactions. Give your health  care provider a list of all the medicines, herbs, non-prescription drugs, or dietary supplements you use. Also tell them if you smoke, drink alcohol, or use illegal drugs. Some items may interact with your medicine. °What should I watch for while using this medicine? °Visit your doctor or health care professional for regular check ups. Check your blood pressure and pulse rate regularly. Ask your health care professional what your blood pressure and pulse rate should be, and when you should contact him or her. °This medicine may make you feel confused, dizzy or lightheaded. Do not drive, use machinery, or do anything that needs mental alertness until you know how this medicine affects you. To reduce the risk of dizzy or fainting spells, do not sit or stand up quickly, especially if you are an older patient. Avoid alcoholic drinks; they can make you more dizzy. °Do not suddenly stop taking amlodipine. Ask your doctor or health care professional how you can gradually reduce the dose. °What side effects may I notice from receiving this medicine? °Side effects that you should report to your doctor or health care professional as soon as possible: °-allergic reactions like skin rash, itching or hives, swelling of the face, lips, or tongue °-breathing problems °-changes in vision or hearing °-chest pain °-fast, irregular heartbeat °-swelling of legs or ankles °Side effects that usually do not require medical attention (report to your doctor or health care professional if they continue or are bothersome): °-dry mouth °-facial flushing °-nausea, vomiting °-stomach gas, pain °-tired, weak °-trouble sleeping °This list may not describe all possible side   effects. Call your doctor for medical advice about side effects. You may report side effects to FDA at 1-800-FDA-1088. Where should I keep my medicine? Keep out of the reach of children. Store at room temperature between 59 and 86 degrees F (15 and 30 degrees C). Protect from  light. Keep container tightly closed. Throw away any unused medicine after the expiration date. NOTE: This sheet is a summary. It may not cover all possible information. If you have questions about this medicine, talk to your doctor, pharmacist, or health care provider.  2017 Elsevier/Gold Standard (2012-03-09 11:40:58) Near-Syncope Introduction Near-syncope is when you suddenly get weak or dizzy, or you feel like you might pass out (faint). During an episode of near-syncope, you may:  Feel dizzy or light-headed.  Feel sick to your stomach (nauseous).  See all white or all black.  Have cold, clammy skin. If you passed out, get help right away.Call your local emergency services (911 in the [place] Do not drive yourself to the hospital. Follow these instructions at home: Pay attention to any changes in your symptoms. Take these actions to help with your condition:  Have someone stay with you until you feel stable.  Do not drive, use machinery, or play sports until your doctor says it is okay.  Keep all follow-up visits as told by your doctor. This is important.  If you start to feel like you might pass out, lie down right away and raise (elevate) your feet above the level of your heart. Breathe deeply and steadily. Wait until all of the symptoms are gone.  Drink enough fluid to keep your pee (urine) clear or pale yellow.  If you are taking blood pressure or heart medicine, get up slowly and spend many minutes getting ready to sit and then stand. This can help with dizziness.  Take over-the-counter and prescription medicines only as told by your doctor. Get help right away if:  You have a very bad headache.  You have unusual pain in your chest, tummy, or back.  You are bleeding from your mouth or rectum.  You have black or tarry poop (stool).  You have a very fast or uneven heartbeat (palpitations).  You pass out one time or more than once.  You have jerky movements that you  cannot control (seizure).  You are confused.  You have trouble walking.  You are very weak.  You have vision problems. These symptoms may be an emergency. Do not wait to see if the symptoms will go away. Get medical help right away. Call your local emergency services (911 in the [place] Do not drive yourself to the hospital.  This information is not intended to replace advice given to you by your health care provider. Make sure you discuss any questions you have with your health care provider. Document Released: 09/28/2007 Document Revised: 09/17/2015 Document Reviewed: 12/24/2014  2017 Elsevier  Bradycardia, Adult Bradycardia is a slower-than-normal heartbeat. A normal resting heart rate for an adult ranges from 60 to 100 beats per minute. With bradycardia, the resting heart rate is less than 60 beats per minute. Bradycardia can prevent enough oxygen from reaching certain areas of your body when you are active. It can be serious if it keeps enough oxygen from reaching your brain and other parts of your body. Bradycardia is not a problem for everyone. For some healthy adults, a slow resting heart rate is normal. What are the causes? This condition may be caused by:  A problem with the  heart, including:  A problem with the heart's electrical system, such as a heart block.  A problem with the heart's natural pacemaker (sinus node).  Heart disease.  A heart attack.  Heart damage.  A heart infection.  A heart condition that is present at birth (congenital heart defect).  Certain medicines that treat heart conditions.  Certain conditions, such as hypothyroidism and obstructive sleep apnea.  Problems with the balance of chemicals and other substances, like potassium, in the blood. What increases the risk? This condition is more likely to develop in adults who:  Are age 46 or older.  Have high blood pressure (hypertension), high cholesterol (hyperlipidemia), or  diabetes.  Drink heavily, use tobacco or nicotine products, or use drugs.  Are stressed. What are the signs or symptoms? Symptoms of this condition include:  Light-headedness.  Feeling faint or fainting.  Fatigue and weakness.  Shortness of breath.  Chest pain (angina).  Drowsiness.  Confusion.  Dizziness. How is this diagnosed? This condition may be diagnosed based on:  Your symptoms.  Your medical history.  A physical exam. During the exam, your health care provider will listen to your heartbeat and check your pulse. To confirm the diagnosis, your health care provider may order tests, such as:  Blood tests.  An electrocardiogram (ECG). This test records the heart's electrical activity. The test can show how fast your heart is beating and whether the heartbeat is steady.  A test in which you wear a portable device (event recorder or Holter monitor) to record your heart's electrical activity while you go about your day.  Anexercise test. How is this treated? Treatment for this condition depends on the cause of the condition and how severe your symptoms are. Treatment may involve:  Treatment of the underlying condition.  Changing your medicines or how much medicine you take.  Having a small, battery-operated device called a pacemaker implanted under the skin. When bradycardia occurs, this device can be used to increase your heart rate and help your heart to beat in a regular rhythm. Follow these instructions at home: Lifestyle  Manage any health conditions that contribute to bradycardia as told by your health care provider.  Follow a heart-healthy diet. A nutrition specialist (dietitian) can help to educate you about healthy food options and changes.  Follow an exercise program that is approved by your health care provider.  Maintain a healthy weight.  Try to reduce or manage your stress, such as with yoga or meditation. If you need help reducing stress, ask  your health care provider.  Do not use use any products that contain nicotine or tobacco, such as cigarettes and e-cigarettes. If you need help quitting, ask your health care provider.  Do not use illegal drugs.  Limit alcohol intake to no more than 1 drink per day for nonpregnant women and 2 drinks per day for men. One drink equals 12 oz of beer, 5 oz of wine, or 1 oz of hard liquor. General instructions  Take over-the-counter and prescription medicines only as told by your health care provider.  Keep all follow-up visits as directed by your health care provider. This is important. How is this prevented? In some cases, bradycardia may be prevented by:  Treating underlying medical problems.  Stopping behaviors or medicines that can trigger the condition. Contact a health care provider if:  You feel light-headed or dizzy.  You almost faint.  You feel weak or are easily fatigued during physical activity.  You experience confusion or  have memory problems. Get help right away if:  You faint.  You have an irregular heartbeat (palpitations).  You have chest pain.  You have trouble breathing. This information is not intended to replace advice given to you by your health care provider. Make sure you discuss any questions you have with your health care provider. Document Released: 01/01/2002 Document Revised: 12/08/2015 Document Reviewed: 10/01/2015 Elsevier Interactive Patient Education  2017 [employer].

## 2016-05-18 NOTE — Discharge Summary (Signed)
[facility] [place] NAME: Sara Heath    MR#:  [id number]  DATE OF BIRTH:  11-28-1921  DATE OF ADMISSION:  05/17/2016 ADMITTING PHYSICIAN: Epifanio Lesches, MD  DATE OF DISCHARGE: 05/18/16  PRIMARY CARE PHYSICIAN: Petra Kuba, MD    ADMISSION DIAGNOSIS:  Syncope [R55] Nausea without vomiting [R11.0] Syncope, unspecified syncope type [R55] Chest pain, unspecified type [R07.9]  DISCHARGE DIAGNOSIS:  Syncope due to his bradycardia. Patient not be the proper discontinue. Heart rate in the 70s. History of coronary artery disease History of hypertension  SECONDARY DIAGNOSIS:   Past Medical History:  Diagnosis Date  . Angioedema   . Asthma   . Breast mass, left   . CAD (coronary artery disease)   . Chronic back pain   . Chronic headache   . Dementia   . Dementia   . Edema of both legs   . Endometrial cancer ([place])   . Gout   . Hematuria   . Hemorrhagic cystitis   . Hypertension   . Malaise and fatigue   . Sleep apnea   . Torticollis   . UTI (lower urinary tract infection)   . Vaginal candidiasis   . Vulvar dystrophy   . Weight loss     HOSPITAL COURSE:  Sara Heath  is a 83 y.o. female with a known history of htn,Dementia, essential hypertension comes in because of syncope. According to patient's daughter, patient was watching TV, suddenly noticed that mom's head was slumped down, unresponsive. Her daughter shook her after several seconds patient became responsive  1. syncopal episode.  -Patient admitted with syncopal episode at home witnessed by daughter. Her heart rate when brought to the emergency room was done in the 40s. She was on beta blockers Toprol-XL 50 mg daily which was discontinued. Her heart rate on telemetry monitoring is 70-72 sinus rhythm. She is asymptomatic denies any chest pain. Electrolytes stable. -Echo shows EF of 60%. Ultrasound carotid done results pending the follow-up at as  outpatient by primary care physician -Seen by physical therapy no PT needs at home -She lives with daughter at home.  2/. Hyperlipidemia Continue home meds    3. Essential hypertension -Discontinued Toprol-XL secondary to bradycardia  -Start low-dose amlodipine 5 mg daily   For DVT prophylaxis subcutaneous Lovenox  Overall appears stable. Discharged to home follow up with cardiology and PCP as outpatient  CODE STATUS full code  CONSULTS OBTAINED:    DRUG ALLERGIES:   Allergies  Allergen Reactions  . Penicillins Anaphylaxis    Has patient had a PCN reaction causing immediate rash, facial/tongue/throat swelling, SOB or lightheadedness with hypotension: Yes Has patient had a PCN reaction causing severe rash involving mucus membranes or skin necrosis: No Has patient had a PCN reaction that required hospitalization No Has patient had a PCN reaction occurring within the last 10 years: No If all of the above answers are "NO", then may proceed with Cephalosporin use.    DISCHARGE MEDICATIONS:   Current Discharge Medication List    START taking these medications   Details  amLODipine (NORVASC) 5 MG tablet Take 1 tablet (5 mg total) by mouth daily. Qty: 30 tablet, Refills: 0      CONTINUE these medications which have NOT CHANGED   Details  aspirin EC 81 MG tablet Take 81 mg by mouth daily.     cetirizine (ZYRTEC) 10 MG tablet Take 10 mg by mouth daily as needed for allergies.  colchicine 0.6 MG tablet take 1 tablet by mouth once daily for PREVENT GOUT take 2 tablets...  (REFER TO PRESCRIPTION NOTES). Refills: 0    isosorbide mononitrate (IMDUR) 120 MG 24 hr tablet Take 120 mg by mouth daily.     nitroGLYCERIN (NITROSTAT) 0.4 MG SL tablet Place 0.4 mg under the tongue every 5 (five) minutes x 3 doses as needed for chest pain. *if not resolved , go to emergency department.*    RA COL-RITE 100 MG capsule take 100 mg by mouth twice a day if needed for STOOL  SOFTENING Refills: 0      STOP taking these medications     metoprolol succinate (TOPROL-XL) 50 MG 24 hr tablet         If you experience worsening of your admission symptoms, develop shortness of breath, life threatening emergency, suicidal or homicidal thoughts you must seek medical attention immediately by calling 911 or calling your MD immediately  if symptoms less severe.  You Must read complete instructions/literature along with all the possible adverse reactions/side effects for all the Medicines you take and that have been prescribed to you. Take any new Medicines after you have completely understood and accept all the possible adverse reactions/side effects.   Please note  You were cared for by a hospitalist during your hospital stay. If you have any questions about your discharge medications or the care you received while you were in the hospital after you are discharged, you can call the unit and asked to speak with the hospitalist on call if the hospitalist that took care of you is not available. Once you are discharged, your primary care physician will handle any further medical issues. Please note that NO REFILLS for any discharge medications will be authorized once you are discharged, as it is imperative that you return to your primary care physician (or establish a relationship with a primary care physician if you do not have one) for your aftercare needs so that they can reassess your need for medications and monitor your lab values. Today   SUBJECTIVE   No complaints  VITAL SIGNS:  Blood pressure (!) 89/45, pulse 63, temperature 98 F (36.7 C), temperature source Oral, resp. rate 18, height 5\' 2"  (1.575 m), weight 55.2 kg (121 lb 11.2 oz), SpO2 98 %.  I/O:   Intake/Output Summary (Last 24 hours) at 05/18/16 1510 Last data filed at 05/18/16 1359  Gross per 24 hour  Intake          1018.33 ml  Output                0 ml  Net          1018.33 ml    PHYSICAL  EXAMINATION:  GENERAL:  83 y.o.-year-old patient lying in the bed with no acute distress.  EYES: Pupils equal, round, reactive to light and accommodation. No scleral icterus. Extraocular muscles intact.  HEENT: Head atraumatic, normocephalic. Oropharynx and nasopharynx clear.  NECK:  Supple, no jugular venous distention. No thyroid enlargement, no tenderness.  LUNGS: Normal breath sounds bilaterally, no wheezing, rales,rhonchi or crepitation. No use of accessory muscles of respiration.  CARDIOVASCULAR: S1, S2 normal. No murmurs, rubs, or gallops.  ABDOMEN: Soft, non-tender, non-distended. Bowel sounds present. No organomegaly or mass.  EXTREMITIES: No pedal edema, cyanosis, or clubbing.  NEUROLOGIC: Cranial nerves II through XII are intact. Muscle strength 5/5 in all extremities. Sensation intact. Gait not checked.  PSYCHIATRIC: The patient is alert and oriented x 3.  SKIN: No obvious rash, lesion, or ulcer.   DATA REVIEW:   CBC   Recent Labs Lab 05/18/16 0442  WBC 2.7*  HGB 11.0*  HCT 32.2*  PLT 141*    Chemistries   Recent Labs Lab 05/18/16 0442  NA 138  K 3.5  CL 108  CO2 25  GLUCOSE 93  BUN 18  CREATININE 0.69  CALCIUM 9.4    Microbiology Results   No results found for this or any previous visit (from the past 240 hour(s)).  RADIOLOGY:  Dg Chest 2 View  Result Date: 05/17/2016 CLINICAL DATA:  Central chest pain, now resolved. EXAM: CHEST  2 VIEW COMPARISON:  05/02/2016 FINDINGS: The patient is again mildly rotated to the left. The cardiac silhouette is upper limits of normal in size. Atherosclerotic calcification and tortuosity are again seen involving the thoracic aorta. Patchy left basilar opacity on the prior study has resolved. Minimal basilar predominant interstitial coarsening is similar to multiple older prior examinations. There is no evidence of acute airspace consolidation, edema, pleural effusion, or pneumothorax. There is gaseous distension of multiple  small and large bowel loops in the visualized upper abdomen. The colon is interposed between the liver and right hemidiaphragm. Thoracic spondylosis and degenerative changes about the shoulders are noted. IMPRESSION: 1. No evidence of acute cardiopulmonary process. 2. Aortic atherosclerosis. Electronically Signed   By: Logan Bores M.D.   On: 05/17/2016 16:20   Ct Head Wo Contrast  Result Date: 05/17/2016 CLINICAL DATA:  83 year old female presenting with chest pain and syncope. Dementia. EXAM: CT HEAD WITHOUT CONTRAST TECHNIQUE: Contiguous axial images were obtained from the base of the skull through the vertex without intravenous contrast. COMPARISON:  Head CT 09/06/2015. FINDINGS: Brain: Mild cerebral and cerebellar atrophy. Patchy and confluent areas of decreased attenuation are noted throughout the deep and periventricular white matter of the cerebral hemispheres bilaterally, compatible with chronic microvascular ischemic disease. No evidence of acute infarction, hemorrhage, hydrocephalus, extra-axial collection or mass lesion/mass effect. Vascular: Extensive atherosclerosis of the cerebral vasculature, most evident in the vertebral arteries bilaterally (left greater than right). Skull: Normal. Negative for fracture or focal lesion. Sinuses/Orbits: Solitary posterior ethmoidal sinus is chronically opacified, similar to remote prior study from 09/06/2015. No acute finding. Other: None. IMPRESSION: 1. No acute intracranial abnormalities. 2. Mild cerebral and cerebellar atrophy with chronic microvascular ischemic changes, as above. 3. Cerebrovascular atherosclerosis. Electronically Signed   By: Vinnie Langton M.D.   On: 05/17/2016 19:04     Management plans discussed with the patient, family and they are in agreement.  CODE STATUS:     Code Status Orders        Start     Ordered   05/17/16 1901  Full code  Continuous     05/17/16 1901    Code Status History    Date Active Date Inactive Code  Status Order ID Comments User Context   09/06/2015 10:00 PM 09/08/2015  5:42 PM Full Code [id number]  Idelle Crouch, MD Inpatient      TOTAL TIME TAKING CARE OF THIS PATIENT: 40 minutes.    Sara Heath M.D on 05/18/2016 at 3:10 PM  Between 7am to 6pm - Pager - [phone] After 6pm go to [email or web address] - password [email or web address] [facility]  Office  [phone]  CC: Primary care physician; Petra Kuba, MD

## 2016-05-18 NOTE — Care Management (Signed)
Patient lives with her daughter.  She was placed in observation for syncope.  Found to have some bradycardia.  [place] of carotids.  History of dementia.  Physical therapy consult is pending

## 2016-05-18 NOTE — Evaluation (Signed)
Physical Therapy Evaluation Patient Details Name: Sara Heath MRN: [id number] DOB: 23-Dec-1921 Today's Date: 05/18/2016   History of Present Illness  Pt admitted for syncope with complaints of chest pain following unresponsive episode prior to admission. Pt with history of HTN, dementia, and CAD s/p MI.   Clinical Impression  Pt is a pleasant 83 year old female who was admitted for syncope. Pt with low blood pressure reading this date. Blood pressure taken once arrived in room; in supine: 105/54; seated: 118/54. Pt with no complaints at this time. Pt demonstrates all bed mobility with supervision, transfers with Mod I, and ambulation with cga using RW. Pt able to follow commands for all mobility and safely navigate household distance. Secondary to increased fall risk, recommend to continue using RW in home for safety. Pt does not require any further PT needs at this time. Pt will be dc in house and does not require follow up. RN aware. Will dc current orders.      Follow Up Recommendations No PT follow up    Equipment Recommendations  None recommended by PT    Recommendations for Other Services       Precautions / Restrictions Precautions Precautions: Fall Restrictions Weight Bearing Restrictions: No      Mobility  Bed Mobility Overal bed mobility: Needs Assistance Bed Mobility: Supine to Sit     Supine to sit: Supervision     General bed mobility comments: safe technique performed with cues for sequencing. Once seated at EOB, able to sit with upright posture  Transfers Overall transfer level: Modified independent Equipment used: Rolling walker (2 wheeled)             General transfer comment: safe technique with RW. upright posture noted. No report of dizziness symptoms with change in position  Ambulation/Gait Ambulation/Gait assistance: Min guard Ambulation Distance (Feet): 60 Feet Assistive device: Rolling walker (2 wheeled) Gait Pattern/deviations:  Step-through pattern     General Gait Details: ambulated in room and in/out of bathrom. Reciprocal gait pattern performed with safe technique. Pt able to follow commands for safety including keeping RW close to body.  Stairs            Wheelchair Mobility    Modified Rankin (Stroke Patients Only)       Balance Overall balance assessment: Needs assistance Sitting-balance support: Feet supported Sitting balance-Leahy Scale: Normal     Standing balance support: Bilateral upper extremity supported Standing balance-Leahy Scale: Good                               Pertinent Vitals/Pain Pain Assessment: No/denies pain    Home Living Family/patient expects to be discharged to:: Private residence Living Arrangements: Children (daughter) Available Help at Discharge: Family Type of Home: House Home Access: Stairs to enter Entrance Stairs-Rails: None Entrance Stairs-Number of Steps: 2 Home Layout: One level Home Equipment: Walker - standard;Cane - single point;Walker - 4 wheels      Prior Function Level of Independence: Independent with assistive device(s)         Comments: Walking with cane household and short community distances; assist from daughter for bathing, dresses self.     Hand Dominance        Extremity/Trunk Assessment   Upper Extremity Assessment Upper Extremity Assessment: Overall WFL for tasks assessed    Lower Extremity Assessment Lower Extremity Assessment: Overall WFL for tasks assessed       Communication  Communication: No difficulties  Cognition Arousal/Alertness: Awake/alert Behavior During Therapy: WFL for tasks assessed/performed Overall Cognitive Status: History of cognitive impairments - at baseline                      General Comments      Exercises Other Exercises Other Exercises: Ambulated to bathroom with cga and set up for bathroom task. Needs min assist for hygiene and don/doffing new gown and  underwear. Safe technique performed   Assessment/Plan    PT Assessment Patent does not need any further PT services  PT Problem List            PT Treatment Interventions      PT Goals (Current goals can be found in the Care Plan section)  Acute Rehab PT Goals Patient Stated Goal: to go home PT Goal Formulation: All assessment and education complete, DC therapy Time For Goal Achievement: 05/18/16 Potential to Achieve Goals: Good    Frequency     Barriers to discharge        Co-evaluation               End of Session Equipment Utilized During Treatment: Gait belt Activity Tolerance: Patient tolerated treatment well Patient left: in bed;with bed alarm set (left with orderly for out of room testing) Nurse Communication: Mobility status    Functional Assessment Tool Used: clinical judgement Functional Limitation: Mobility: Walking and moving around Mobility: Walking and Moving Around Current Status [phone]): 0 percent impaired, limited or restricted Mobility: Walking and Moving Around Goal Status [phone]): 0 percent impaired, limited or restricted Mobility: Walking and Moving Around Discharge Status [phone]): 0 percent impaired, limited or restricted    Time: [id number] PT Time Calculation (min) (ACUTE ONLY): 24 min   Charges:   PT Evaluation $PT Eval Low Complexity: 1 Procedure PT Treatments $Therapeutic Activity: 8-22 mins   PT G Codes:   PT G-Codes **NOT FOR INPATIENT CLASS** Functional Assessment Tool Used: clinical judgement Functional Limitation: Mobility: Walking and moving around Mobility: Walking and Moving Around Current Status [id number]): 0 percent impaired, limited or restricted Mobility: Walking and Moving Around Goal Status [id number]): 0 percent impaired, limited or restricted Mobility: Walking and Moving Around Discharge Status [id number]): 0 percent impaired, limited or restricted    Sundeep Destin 05/18/2016, 3:49 PM  Greggory Stallion, PT,  DPT [phone]

## 2016-05-18 NOTE — Progress Notes (Signed)
*  PRELIMINARY RESULTS* Echocardiogram 2D Echocardiogram has been performed.  Sherrie Sport 05/18/2016, 11:53 AM

## 2016-05-18 NOTE — Progress Notes (Signed)
Patient discharged via wheelchair and private vehicle. IV removed and catheter intact. All discharge instructions given and patient verbalizes understanding. Tele removed and returned. prescriptions given to patient No distress noted.    

## 2016-08-11 ENCOUNTER — Emergency Department
Admission: EM | Admit: 2016-08-11 | Discharge: 2016-08-11 | Disposition: A | Discharge status: Home / Self Care | Payer: Medicare Other | Attending: Emergency Medicine | Admitting: Emergency Medicine

## 2016-08-11 ENCOUNTER — Encounter: Payer: Self-pay | Admitting: Emergency Medicine

## 2016-08-11 ENCOUNTER — Emergency Department: Payer: Medicare Other

## 2016-08-11 DIAGNOSIS — I10 Essential (primary) hypertension: Secondary | ICD-10-CM | POA: Diagnosis not present

## 2016-08-11 DIAGNOSIS — R079 Chest pain, unspecified: Secondary | ICD-10-CM | POA: Insufficient documentation

## 2016-08-11 DIAGNOSIS — I2510 Atherosclerotic heart disease of native coronary artery without angina pectoris: Secondary | ICD-10-CM | POA: Diagnosis not present

## 2016-08-11 DIAGNOSIS — Z79899 Other long term (current) drug therapy: Secondary | ICD-10-CM | POA: Insufficient documentation

## 2016-08-11 DIAGNOSIS — J45909 Unspecified asthma, uncomplicated: Secondary | ICD-10-CM | POA: Diagnosis not present

## 2016-08-11 LAB — BASIC METABOLIC PANEL
Anion gap: 6 (ref 5–15)
BUN: 18 mg/dL (ref 6–20)
CHLORIDE: 107 mmol/L (ref 101–111)
CO2: 27 mmol/L (ref 22–32)
Calcium: 10.3 mg/dL (ref 8.9–10.3)
Creatinine, Ser: 0.88 mg/dL (ref 0.44–1.00)
GFR calc Af Amer: >60 mL/min (ref >60)
GFR, EST NON AFRICAN AMERICAN: 55 mL/min — AB (ref >60)
GLUCOSE: 114 mg/dL — AB (ref 65–99)
POTASSIUM: 4.1 mmol/L (ref 3.5–5.1)
Sodium: 140 mmol/L (ref 135–145)

## 2016-08-11 LAB — CBC
HEMATOCRIT: 39 % (ref 35.0–47.0)
HEMOGLOBIN: 13 g/dL (ref 12.0–16.0)
MCH: 31.6 pg (ref 26.0–34.0)
MCHC: 33.3 g/dL (ref 32.0–36.0)
MCV: 94.9 fL (ref 80.0–100.0)
Platelets: 187 10*3/uL (ref 150–440)
RBC: 4.11 MIL/uL (ref 3.80–5.20)
RDW: 16 % — ABNORMAL HIGH (ref 11.5–14.5)
WBC: 5 10*3/uL (ref 3.6–11.0)

## 2016-08-11 LAB — TROPONIN I: Troponin I: <0.03 ng/mL (ref <0.03)

## 2016-08-11 NOTE — ED Notes (Signed)
Pt lying on stretcher, no distress noted. Pt states central CP that began all of a sudden at 2pm. States she woke up with pain and felt hot. Pt states she feels good at this time. Family member gave pt 2 Sl nitro and states that helped pain. Family member states hx of cardiac, has had stents placed "a long time ago."

## 2016-08-11 NOTE — ED Notes (Signed)
Pt sleeping on stretcher, blanket covering pt. Rise and fall of chest noted.

## 2016-08-11 NOTE — ED Triage Notes (Signed)
Pt in via POV with complaints of chest pain with some nausea and light headedness starting early this afternoon.  Pt daughter reports chest pain unrelieved by 2 Nitro.  Pt points to epigastric area when asked to locate her pain.  Vitals WDL, NAD noted at this time.

## 2016-08-11 NOTE — ED Provider Notes (Signed)
[facility] Emergency Department Provider Note  ____________________________________________   None    (approximate)  I have reviewed the triage vital signs and the nursing notes.   HISTORY  Chief Complaint Chest Pain   HPI CAROLYNN TULEY is a 83 y.o. female With a history of coronary artery disease on aspirin and was presenting with chest pain. She is here with her daughter says the pain started about 2 PM and lasted about an hour. The patient's arrival to say the quality of the pain. However, she says that she has been pain-free ever since the episode. The pain was initially to the upper abdomen and lower chest and radiated to the right side of the chest. The patient was given 2 nitroglycerin tablets only had minimal relief. The patient says that she also felt dizzy and short of breath during the episode. The daughter says that the patient has had 2 previous episodes that have been similar. Recent echocardiogram in January with 60% EF.Patient does not see a cardiologist as an outpatient.   Past Medical History:  Diagnosis Date  . Angioedema   . Asthma   . Breast mass, left   . CAD (coronary artery disease)   . Chronic back pain   . Chronic headache   . Dementia   . Dementia   . Edema of both legs   . Endometrial cancer ([place])   . Gout   . Hematuria   . Hemorrhagic cystitis   . Hypertension   . Malaise and fatigue   . Sleep apnea   . Torticollis   . UTI (lower urinary tract infection)   . Vaginal candidiasis   . Vulvar dystrophy   . Weight loss     Patient Active Problem List   Diagnosis Date Noted  . Malnutrition of moderate degree 09/08/2015  . Pancytopenia ([place]) 09/08/2015  . Acute cystitis with hematuria 09/08/2015  . General weakness 09/08/2015  . Dementia 09/08/2015  . Syncope 09/06/2015  . CAD (coronary artery disease) 09/06/2015  . HTN, goal below 140/80 09/06/2015  . History of endometrial cancer 09/06/2015    Past  Surgical History:  Procedure Laterality Date  . CORONARY ANGIOPLASTY WITH STENT PLACEMENT    . left breast biopsy    . tubal ligation      Prior to Admission medications   Medication Sig Start Date End Date Taking? Authorizing Provider  amLODipine (NORVASC) 5 MG tablet Take 1 tablet (5 mg total) by mouth daily. 05/19/16   Fritzi Mandes, MD  aspirin EC 81 MG tablet Take 81 mg by mouth daily.     Historical Provider, MD  cetirizine (ZYRTEC) 10 MG tablet Take 10 mg by mouth daily as needed for allergies.  11/26/14   Historical Provider, MD  colchicine 0.6 MG tablet take 1 tablet by mouth once daily for PREVENT GOUT take 2 tablets...  (REFER TO PRESCRIPTION NOTES). 07/03/15   Historical Provider, MD  isosorbide mononitrate (IMDUR) 120 MG 24 hr tablet Take 120 mg by mouth daily.  11/13/14   Historical Provider, MD  nitroGLYCERIN (NITROSTAT) 0.4 MG SL tablet Place 0.4 mg under the tongue every 5 (five) minutes x 3 doses as needed for chest pain. *if not resolved , go to emergency department.*    Historical Provider, MD  RA COL-RITE 100 MG capsule take 100 mg by mouth twice a day if needed for STOOL SOFTENING 10/01/14   Historical Provider, MD    Allergies Penicillins  Family History  Problem  Relation Age of Onset  . Family history unknown: Yes    Social History Social History  Substance Use Topics  . Smoking status: Never Smoker  . Smokeless tobacco: Never Used  . Alcohol use No    Review of Systems Constitutional: No fever/chills Eyes: No visual changes. ENT: No sore throat. Cardiovascular: as above Respiratory: as above Gastrointestinal: No abdominal pain.  no vomiting.  No diarrhea.  No constipation. Genitourinary: Negative for dysuria. Musculoskeletal: Negative for back pain. Skin: Negative for rash. Neurological: Negative for headaches, focal weakness or numbness.  10-point ROS otherwise negative.  ____________________________________________   PHYSICAL EXAM:  VITAL  SIGNS: ED Triage Vitals  Enc Vitals Group     BP 08/11/16 1616 120/74     Pulse Rate 08/11/16 1616 90     Resp 08/11/16 1616 18     Temp 08/11/16 1616 97.8 F (36.6 C)     Temp Source 08/11/16 1616 Axillary     SpO2 08/11/16 1616 100 %     Weight 08/11/16 1616 126 lb (57.2 kg)     Height 08/11/16 1616 5\' 4"  (1.626 m)     Head Circumference --      Peak Flow --      Pain Score 08/11/16 1804 5     Pain Loc --      Pain Edu? --      Excl. in [place]? --     Constitutional: Alert and oriented. Well appearing and in no acute distress. Eyes: Conjunctivae are normal. PERRL. EOMI. Head: Atraumatic. Nose: No congestion/rhinnorhea. Mouth/Throat: Mucous membranes are moist.   Neck: No stridor.   Cardiovascular: Normal rate, regular rhythm. Grossly normal heart sounds.  Good peripheral circulation With equal and bilateral radial pulses. Respiratory: Normal respiratory effort.  No retractions. Lungs CTAB. Gastrointestinal: Soft and nontender. No distention.  Musculoskeletal: No lower extremity tenderness nor edema.  No joint effusions. Neurologic:  Normal speech and language. No gross focal neurologic deficits are appreciated.  Skin:  Skin is warm, dry and intact. No rash noted. Psychiatric: Mood and affect are normal. Speech and behavior are normal.  ____________________________________________   LABS (all labs ordered are listed, but only abnormal results are displayed)  Labs Reviewed  BASIC METABOLIC PANEL - Abnormal; Notable for the following:       Result Value   Glucose, Bld 114 (*)    GFR calc non Af Amer 55 (*)    All other components within normal limits  CBC - Abnormal; Notable for the following:    RDW 16.0 (*)    All other components within normal limits  TROPONIN I  TROPONIN I   ____________________________________________  EKG  ED ECG REPORT I, Doran Stabler, the attending physician, personally viewed and interpreted this ECG.   Date: 08/11/2016  EKG Time:  1618  Rate: 88  Rhythm: normal sinus rhythm  Axis: normal  Intervals:none  ST&T Change: No ST segment elevation or depression. T-wave inversion in aVL.  ____________________________________________  GLOVFIEPP  DG Chest 2 View (Final result)  Result time 08/11/16 16:46:16  Final result by Etheleen Mayhew, MD (08/11/16 16:46:16)           Narrative:   CLINICAL DATA: 83 year old female with history of nausea and lightheadedness since this afternoon. Chest pain relieved by nitroglycerin.  EXAM: CHEST 2 VIEW  COMPARISON: Chest x-ray 05/17/2016.  FINDINGS: Skin fold artifacts overlying the lower thorax bilaterally. Lung volumes are normal. No consolidative airspace disease. No pleural effusions. No  pneumothorax. No pulmonary nodule or mass noted. Pulmonary vasculature and the cardiomediastinal silhouette are within normal limits. Atherosclerosis in the thoracic aorta.  IMPRESSION: 1. No radiographic evidence of acute cardiopulmonary disease. 2. Aortic atherosclerosis.   Electronically Signed By: Vinnie Langton M.D. On: 08/11/2016 16:46            ____________________________________________   PROCEDURES  Procedure(s) performed:   Procedures  Critical Care performed:   ____________________________________________   INITIAL IMPRESSION / ASSESSMENT AND PLAN / ED COURSE  Pertinent labs & imaging results that were available during my care of the patient were reviewed by me and considered in my medical decision making (see chart for details).   ----------------------------------------- 8:17 PM on 08/11/2016 -----------------------------------------  Patient resting comfortable at this time. Second troponin is negative. No further episodes of chest pain. Will be discharged to home. Discussed the case with the patient as well as the daughter. The patient will follow-up in the office with cardiology. They're understanding of this plan and willing  to comply.  Unlikely to be acute MI at this time. Chest pain-free with 2 negative troponins. Reassuring vital signs. Unlikely to be PE as well. Equal pulses. Unlikely to be dissection. Feel the patient is appropriate for discharge and outpatient treatment at this time.     ____________________________________________   FINAL CLINICAL IMPRESSION(S) / ED DIAGNOSES  Chest pain.    NEW MEDICATIONS STARTED DURING THIS VISIT:  New Prescriptions   No medications on file     Note:  This document was prepared using Dragon voice recognition software and may include unintentional dictation errors.    Orbie Pyo, MD 08/11/16 2018

## 2016-12-01 IMAGING — US US CAROTID DUPLEX BILAT
1 series · 13 of 24 positions shown · non-contrast
Comparison: None.

CLINICAL DATA: Syncope.  Hypertension, coronary artery disease.

EXAM:
BILATERAL CAROTID DUPLEX ULTRASOUND
TECHNIQUE: Gray scale imaging, color Doppler and duplex ultrasound was
performed of bilateral carotid and vertebral arteries in the neck.
TECHNIQUE: Quantification of carotid stenosis is based on velocity parameters
that correlate the residual internal carotid diameter with
NASCET-based stenosis levels, using the diameter of the distal
internal carotid lumen as the denominator for stenosis measurement.

[Series 1: us carotid duplex bilat · 13 of 66 slices shown]
[im 1/66]
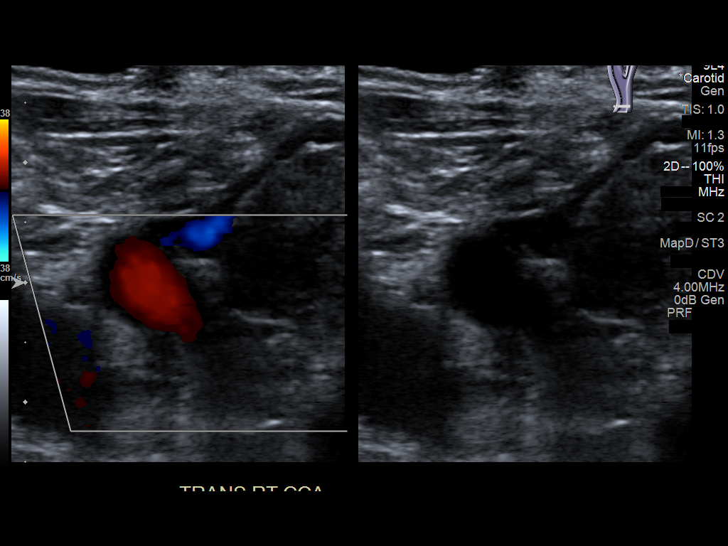
[im 6/66]
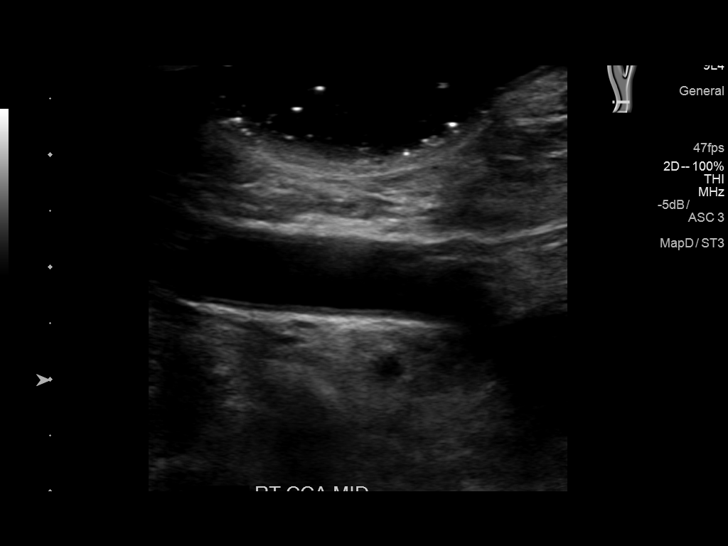
[im 12/66]
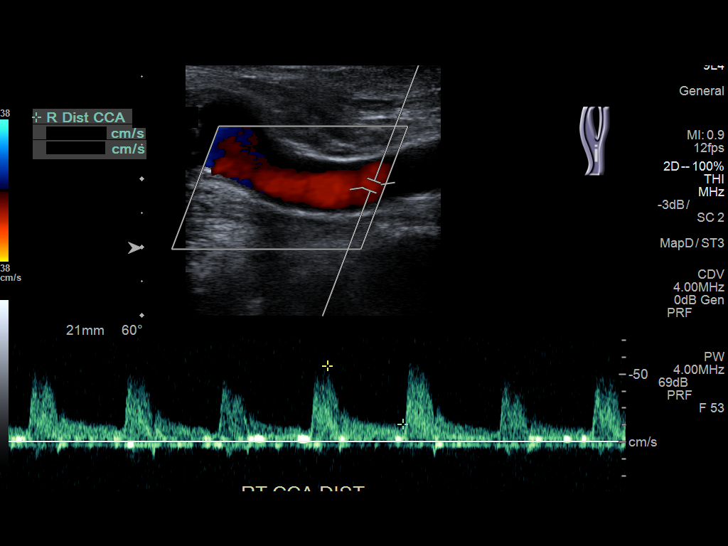
[im 17/66]
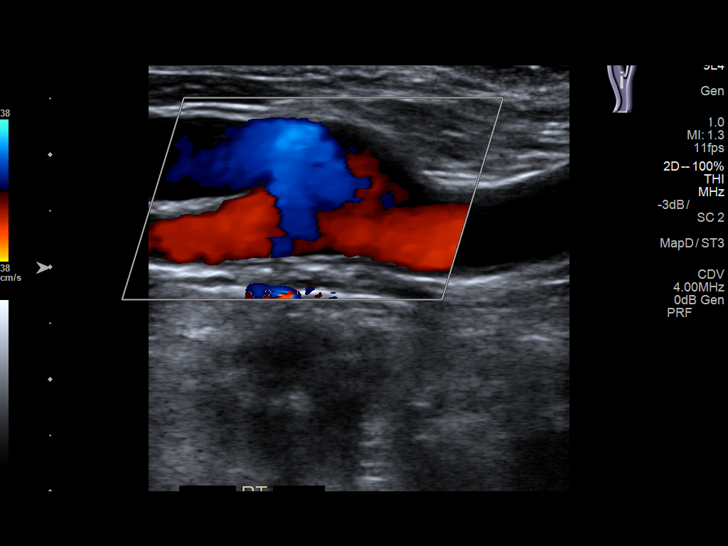
[im 23/66]
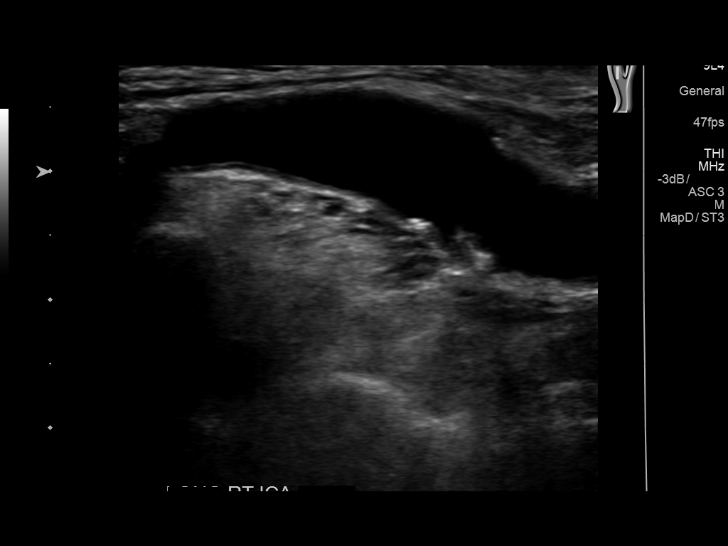
[im 29/66]
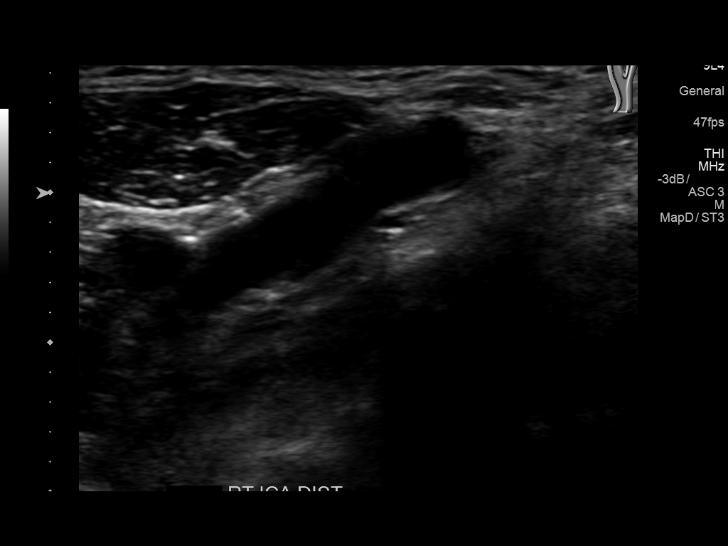
[im 34/66]
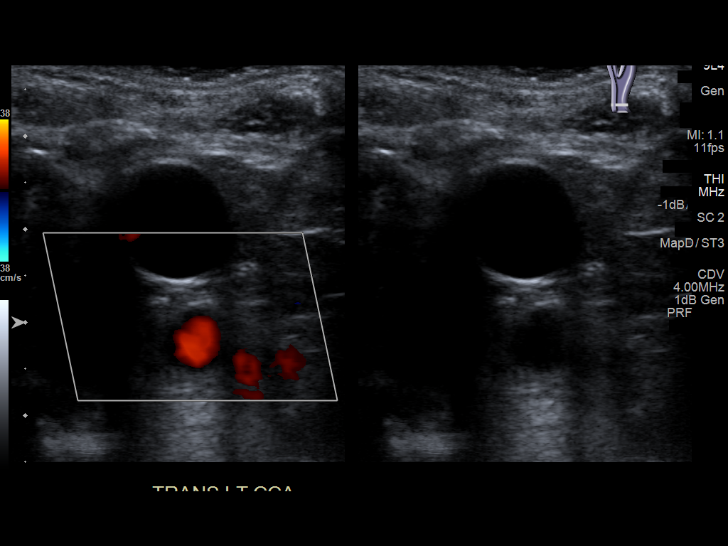
[im 37/66]
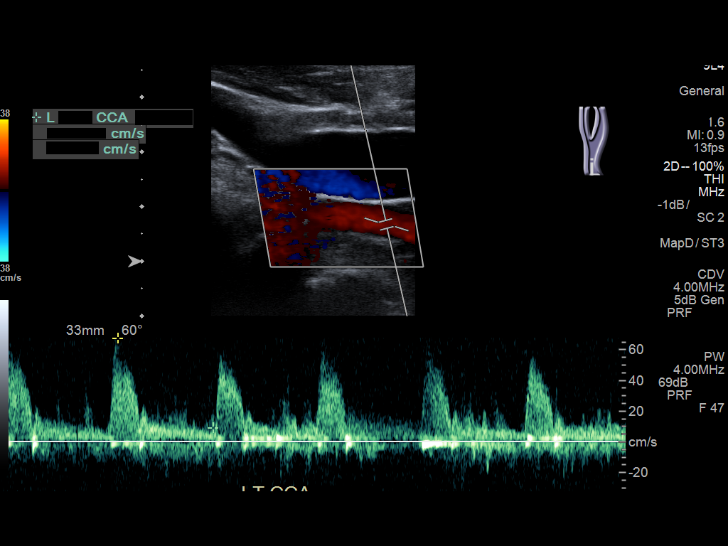
[im 43/66]
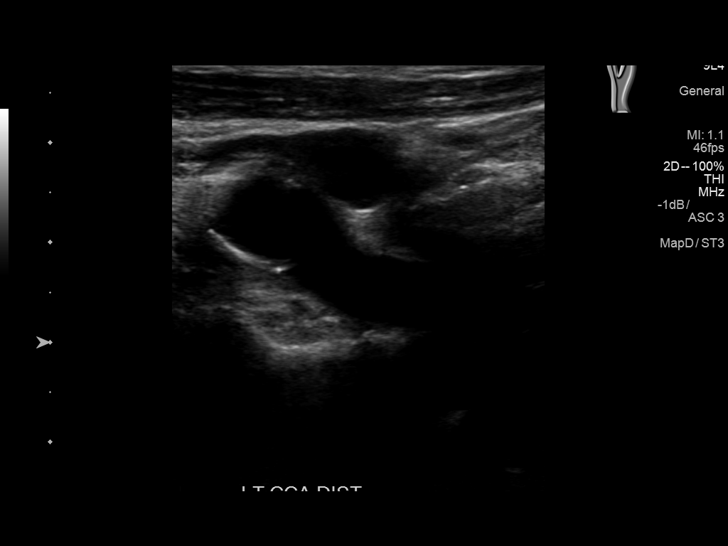
[im 49/66]
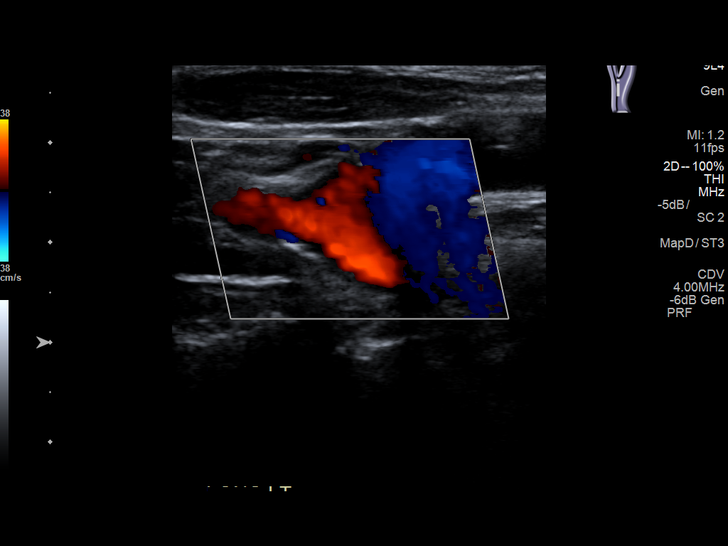
[im 54/66]
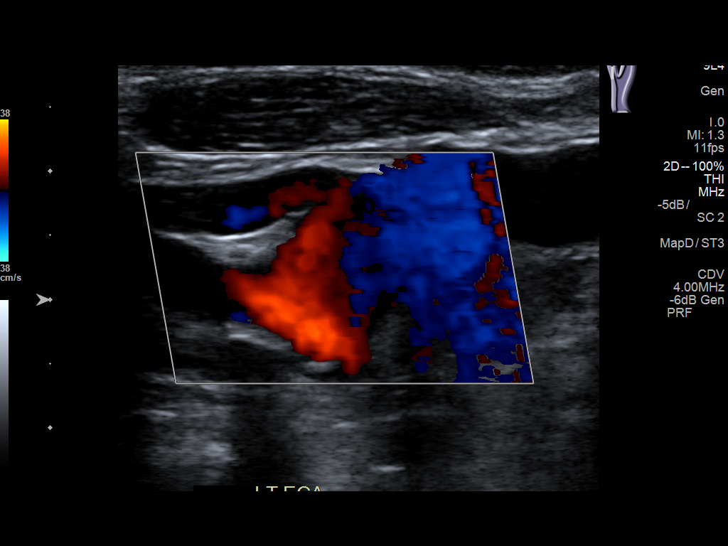
[im 60/66]
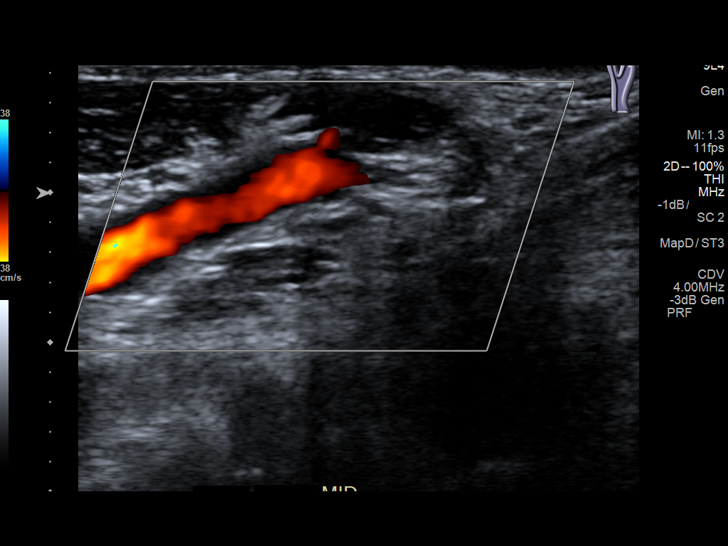
[im 66/66]
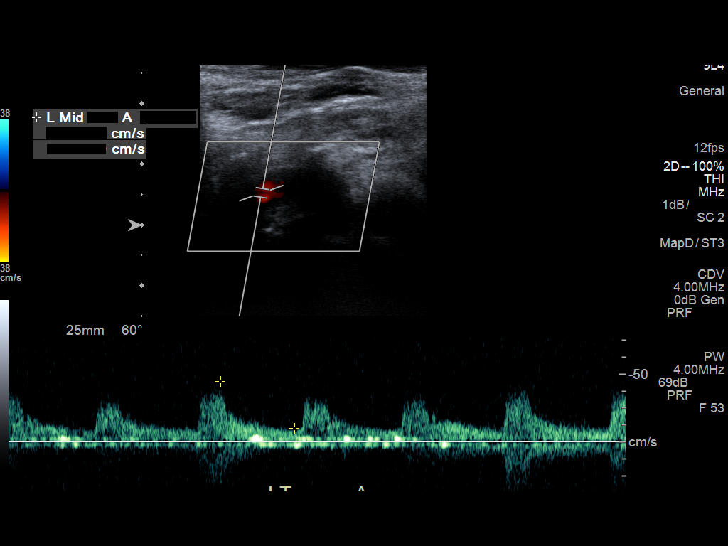

[13 of 24 positions shown; findings below may reference images not displayed]

The following velocity measurements were obtained:

PEAK SYSTOLIC/END DIASTOLIC

RIGHT

ICA:                     138/32cm/sec

CCA:                     80/14cm/sec

SYSTOLIC ICA/CCA RATIO:

DIASTOLIC ICA/CCA RATIO:

ECA:                     66cm/sec

LEFT

ICA:                     99/22cm/sec

CCA:                     63/8cm/sec

SYSTOLIC ICA/CCA RATIO:

DIASTOLIC ICA/CCA RATIO:

ECA:                     43cm/sec
FINDINGS: RIGHT CAROTID ARTERY: Eccentric partially calcified plaque in the
bulb. No high-grade stenosis. Normal waveforms and color Doppler
signal.

RIGHT VERTEBRAL ARTERY:  Normal flow direction and waveform.

LEFT CAROTID ARTERY: Minimal plaque in the bulb. No significant
stenosis. Normal waveforms and color Doppler signal. Distal ICA
tortuous.

LEFT VERTEBRAL ARTERY: Normal flow direction and waveform.
IMPRESSION: 1. Mild bilateral carotid bifurcation plaque resulting in less than
50% diameter stenosis. The exam does not exclude plaque ulceration
or embolization. Continued surveillance recommended.
2.  Antegrade bilateral vertebral arterial flow.

## 2017-01-11 ENCOUNTER — Inpatient Hospital Stay: Payer: Medicare Other

## 2017-01-11 NOTE — Progress Notes (Deleted)
Gynecologic Oncology Consult Visit   Referring Provider: Dr. Laverta Heath  Chief Concern: endometrial cancer  Subjective:  Sara Heath is a 83 y.o. female who is seen in consultation from Sara Heath for poorly differentiated endometrial cancer s/p primary radiation. Of note Sara Heath has dementia and is not capable of making her own decisions. She does not have HCPOA, but she does have a very supportive family.   Presents today for surveillance. She has no complaints of vaginal bleeding. At her last visit she was noted to have an enlarged cervical node. PET was ordered but not scheduled by patient.   08/11/2016 CXR negative for mets 05/17/2016 Head CT negative for mets  Gynecologic Oncology  Sara Heath presented with abnormal vaginal bleeding. She had a pelvic ultrasound on 12/07/2014 that revealed the following:   Measurements: Uterus 10.5 x 3.9 x 6.7 cm. Multiple calcifications are noted throughout the uterus. These are similar to that seen on a prior CT examination. These are not felt to represent uterine fibroids but more likely parenchymal calcification.  Endometrium  Thickness: 7.7 mm.  Right ovary Not well visualized Left ovary Measurements: 1.8 x 0.8 x 1.3 cm.   EMBx poorly differentiated endometrioid adenocarcinoma  12/22/2014  CXR: No acute cardiopulmonary disease. CT A/P: IMPRESSION: 1. Similar to prior CT scan there is a large volume of sludge and perhaps numerous small calculi within the gallbladder. There is mild but progressive dilatation of the extrahepatic and intrahepatic bile ducts as well as of the pancreatic duct. Although there is no pancreatic head mass, consider MRCP to further investigate. 2. Small adrenal nodules too small to characterize. Adrenal metastasis would be an unusual manifestation that endometrial cancer, but the possibility is not excluded. 3. Air within the endometrial canal presumably due to recent instrumentation 4. 8  mm retroperitoneal periaortic lymph node. Other smaller pelvic lymph nodes as described above. 5. Staghorn calculus left kidney   I previously contacted Ms. [facility] care provider, Sara Heath [phone], regarding her CXR and CT scan. There is no definitive evidence of cancer. On CT scan she did have a 8 mm PA node which is not grossly enlarged. I recommended a cardiology consult for clearance and she saw Sara Heath at [facility]. She was seen by Sara Heath on 02/13/2015. I spoke with him personally. She has a 5% risk of MI and at least a 5% risk of cardiac failure. No further testing was done as he did not feel it would offer further valuable information. We recommended radiation therapy.    She received external beam radiation therapy 05/2015. She was seen at [facility] for consideration of brachytherapy and that was declined.    Past Medical History: Past Medical History:  Diagnosis Date  . Angioedema   . Asthma   . Breast mass, left   . CAD (coronary artery disease)   . Chronic back pain   . Chronic headache   . Dementia   . Dementia   . Edema of both legs   . Endometrial cancer ([place])   . Gout   . Hematuria   . Hemorrhagic cystitis   . Hypertension   . Malaise and fatigue   . Sleep apnea   . Torticollis   . UTI (lower urinary tract infection)   . Vaginal candidiasis   . Vulvar dystrophy   . Weight loss     Past Surgical History: Past Surgical History:  Procedure Laterality Date  . CORONARY ANGIOPLASTY WITH STENT PLACEMENT    .  left breast biopsy    . tubal ligation      Past Gynecologic History:  See HPI  OB History:  OB History  Gravida Para Term Preterm AB SAB TAB Ectopic Multiple Living  11 8            Family History: Family History  Problem Relation Age of Onset  . Family history unknown: Yes    Social History: Social History   Social History  . Marital status: Widowed    Spouse name: N/A  . Number of children: N/A  . Years of education: N/A    Occupational History  . Not on file.   Social History Main Topics  . Smoking status: Never Smoker  . Smokeless tobacco: Never Used  . Alcohol use No  . Drug use: No  . Sexual activity: Not on file   Other Topics Concern  . Not on file   Social History Narrative  . No narrative on file    Allergies: Allergies  Allergen Reactions  . Penicillins Anaphylaxis    Has patient had a PCN reaction causing immediate rash, facial/tongue/throat swelling, SOB or lightheadedness with hypotension: Yes Has patient had a PCN reaction causing severe rash involving mucus membranes or skin necrosis: No Has patient had a PCN reaction that required hospitalization No Has patient had a PCN reaction occurring within the last 10 years: No If all of the above answers are "NO", then may proceed with Cephalosporin use.    Current Medications: Current Outpatient Prescriptions  Medication Sig Dispense Refill  . amLODipine (NORVASC) 5 MG tablet Take 1 tablet (5 mg total) by mouth daily. 30 tablet 0  . aspirin EC 81 MG tablet Take 81 mg by mouth daily.     . cetirizine (ZYRTEC) 10 MG tablet Take 10 mg by mouth daily as needed for allergies.     Marland Kitchen colchicine 0.6 MG tablet take 1 tablet by mouth once daily for PREVENT GOUT take 2 tablets...  (REFER TO PRESCRIPTION NOTES).  0  . isosorbide mononitrate (IMDUR) 120 MG 24 hr tablet Take 120 mg by mouth daily.     . nitroGLYCERIN (NITROSTAT) 0.4 MG SL tablet Place 0.4 mg under the tongue every 5 (five) minutes x 3 doses as needed for chest pain. *if not resolved , go to emergency department.*    . RA COL-RITE 100 MG capsule take 100 mg by mouth twice a day if needed for STOOL SOFTENING  0   No current facility-administered medications for this visit.     Review of Systems   General: weight loss, weakness  HEENT: no complaints  Lungs: cough  Cardiac: negative  GI: negative  GU: negative other than vulvar itching  Musculoskeletal: no complaints   Extremities: chronic leg swelling  Skin: negative  Neuro: negative  Endocrine: no complaints  Psych: no complaints        Objective:  Physical Examination:  There were no vitals taken for this visit.     ECOG Performance Status: 3 - Symptomatic, >50% confined to bed or chair  General appearance: appears stated age and no distress, cooperative but non interactive HEENT:PERRLA and sclera clear, anicteric  Lymph node survey: Firm fixed 1 cm left cervical lymph node which is unchanged; no obvious supraclavicular, axillary or inguinal nodes.  CV: RRR Lungs: CTA Abdomen: soft, non-tender, without masses or organomegaly and no hernias and no ascites.  Back: inspection of back is normal Extremities: edema bilateral and symmetrical. Per patient family she has  chronic edema  Skin: pigmentation changes in radiation field.  Neurological exam reveals Patient follows commands and shook my hand; she did not engage in the conversation.  Pelvic: exam chaperoned by nurse;  Vulva: normal appearing vulva with no masses, tenderness or lesions; Vagina: normal vagina; Adnexa: nontender and no masses; Uterus: unable to determine size, not grossly enlarged and nontender; Cervix: unable to visualize fully. Deviated posteriorly. No abnormalities on palpation. Parametria was smooth bilaterally; Rectal: not indicated    Lab Review N/A   Radiologic Imaging: N/A    Assessment:  MARIJOSE CURINGTON is a 83 y.o. female diagnosed with grade 3 endometrial cancer, non-operable candidate, s/p external beam radiation therapy. Enlarged firm cervical node on exam, stable. History of  8 mm retroperitoneal periaortic lymph node, which is within normal limits. Medical co-morbidities complicating care: Dementia, frailty, and CAD. Plan:      Problem List Items Addressed This Visit    Endometrial cancer        Right cervical adenopathy  Visit Diagnoses    Endometrial cancer    -  Primary    Dementia       I  discussed the exam findings with the patient and her daughter. The cervical mass is stable so I do not think this is disease. I also did not recommend a CT scan to follow up the PA node as her mother is very frail and I do not think she could tolerate therapy if she did have a recurrence. I recommend our focus be on quality of life and symptom management, currently her mother is NED. She agreed with this plan. She will follow up and schedule appointment with Sara Heath in 6 months and then return to our clinic in 12 months.    Sara Ends, MD     CC:  Sara Heath

## 2017-02-01 ENCOUNTER — Inpatient Hospital Stay: Payer: Medicare Other

## 2017-02-01 NOTE — Progress Notes (Deleted)
Gynecologic Oncology Consult Visit   Referring Provider: Dr. Laverta Heath  Chief Concern: endometrial cancer  Subjective:  Sara Heath is a 83 y.o. female who is seen in consultation from Dr. Ouida Sills for poorly differentiated endometrial cancer s/p primary radiation. Of note Sara Heath has dementia and is not capable of making her own decisions. She does not have HCPOA, but she does have a very supportive family.   Presents today for surveillance. She was last seen 01/13/2016.   Gynecologic Oncology  Sara Heath presented with abnormal vaginal bleeding. She had a pelvic ultrasound on 12/07/2014 that revealed the following:   Measurements: Uterus 10.5 x 3.9 x 6.7 cm. Multiple calcifications are noted throughout the uterus. These are similar to that seen on a prior CT examination. These are not felt to represent uterine fibroids but more likely parenchymal calcification.  Endometrium  Thickness: 7.7 mm.  Right ovary Not well visualized Left ovary Measurements: 1.8 x 0.8 x 1.3 cm.   EMBx poorly differentiated endometrioid adenocarcinoma  12/22/2014  CXR: No acute cardiopulmonary disease. CT A/P: IMPRESSION: 1. Similar to prior CT scan there is a large volume of sludge and perhaps numerous small calculi within the gallbladder. There is mild but progressive dilatation of the extrahepatic and intrahepatic bile ducts as well as of the pancreatic duct. Although there is no pancreatic head mass, consider MRCP to further investigate. 2. Small adrenal nodules too small to characterize. Adrenal metastasis would be an unusual manifestation that endometrial cancer, but the possibility is not excluded. 3. Air within the endometrial canal presumably due to recent instrumentation 4. 8 mm retroperitoneal periaortic lymph node. Other smaller pelvic lymph nodes as described above. 5. Staghorn calculus left kidney   I previously contacted Sara Heath care provider, Sara Heath [phone], regarding her CXR and CT scan. There is no definitive evidence of cancer. On CT scan she did have a 8 mm PA node which is not grossly enlarged. I recommended a cardiology consult for clearance and she saw Dr. Venetia Maxon at [facility]. She was seen by Dr. Alycia Rossetti on 02/13/2015. I spoke with him personally. She has a 5% risk of MI and at least a 5% risk of cardiac failure. No further testing was done as he did not feel it would offer further valuable information. We recommended radiation therapy.    She received external beam radiation therapy 05/2015. She was seen at [facility] for consideration of brachytherapy and that was declined.   08/05/2015 She was noted to have a cervical mass on exam which had been stable. We opted not to proceed with imaging as the patient is very frail and probably could tolerate therapy if she did have a recurrence. The family agreed with this approach.   08/11/2016 CXR negative for mets 05/17/2016 Head CT negative for mets  Past Medical History: Past Medical History:  Diagnosis Date  . Angioedema   . Asthma   . Breast mass, left   . CAD (coronary artery disease)   . Chronic back pain   . Chronic headache   . Dementia   . Dementia   . Edema of both legs   . Endometrial cancer ([place])   . Gout   . Hematuria   . Hemorrhagic cystitis   . Hypertension   . Malaise and fatigue   . Sleep apnea   . Torticollis   . UTI (lower urinary tract infection)   . Vaginal candidiasis   . Vulvar dystrophy   . Weight loss  Past Surgical History: Past Surgical History:  Procedure Laterality Date  . CORONARY ANGIOPLASTY WITH STENT PLACEMENT    . left breast biopsy    . tubal ligation      Past Gynecologic History:  See HPI  OB History:  OB History  Gravida Para Term Preterm AB SAB TAB Ectopic Multiple Living  11 8            Family History: Family History  Problem Relation Age of Onset  . Family history unknown: Yes    Social History: Social  History   Social History  . Marital status: Widowed    Spouse name: N/A  . Number of children: N/A  . Years of education: N/A   Occupational History  . Not on file.   Social History Main Topics  . Smoking status: Never Smoker  . Smokeless tobacco: Never Used  . Alcohol use No  . Drug use: No  . Sexual activity: Not on file   Other Topics Concern  . Not on file   Social History Narrative  . No narrative on file    Allergies: Allergies  Allergen Reactions  . Penicillins Anaphylaxis    Has patient had a PCN reaction causing immediate rash, facial/tongue/throat swelling, SOB or lightheadedness with hypotension: Yes Has patient had a PCN reaction causing severe rash involving mucus membranes or skin necrosis: No Has patient had a PCN reaction that required hospitalization No Has patient had a PCN reaction occurring within the last 10 years: No If all of the above answers are "NO", then may proceed with Cephalosporin use.    Current Medications: Current Outpatient Prescriptions  Medication Sig Dispense Refill  . amLODipine (NORVASC) 5 MG tablet Take 1 tablet (5 mg total) by mouth daily. 30 tablet 0  . aspirin EC 81 MG tablet Take 81 mg by mouth daily.     . cetirizine (ZYRTEC) 10 MG tablet Take 10 mg by mouth daily as needed for allergies.     Marland Kitchen colchicine 0.6 MG tablet take 1 tablet by mouth once daily for PREVENT GOUT take 2 tablets...  (REFER TO PRESCRIPTION NOTES).  0  . isosorbide mononitrate (IMDUR) 120 MG 24 hr tablet Take 120 mg by mouth daily.     . nitroGLYCERIN (NITROSTAT) 0.4 MG SL tablet Place 0.4 mg under the tongue every 5 (five) minutes x 3 doses as needed for chest pain. *if not resolved , go to emergency department.*    . RA COL-RITE 100 MG capsule take 100 mg by mouth twice a day if needed for STOOL SOFTENING  0   No current facility-administered medications for this visit.     ***Review of Systems   General: weight loss, weakness  HEENT: no  complaints  Lungs: cough  Cardiac: negative  GI: negative  GU: negative other than vulvar itching  Musculoskeletal: no complaints  Extremities: chronic leg swelling  Skin: negative  Neuro: negative  Endocrine: no complaints  Psych: no complaints        Objective:  Physical Examination:  There were no vitals taken for this visit.     ECOG Performance Status: 3 - Symptomatic, >50% confined to bed or chair  General appearance: appears stated age and no distress, cooperative but non interactive HEENT:PERRLA and sclera clear, anicteric  Lymph node survey: Firm fixed 1 cm left cervical lymph node which is unchanged; no obvious supraclavicular, axillary or inguinal nodes.  CV: RRR Lungs: CTA Abdomen: soft, non-tender, without masses or organomegaly and no  hernias and no ascites.  Back: inspection of back is normal Extremities: edema bilateral and symmetrical. Per patient family she has chronic edema  Skin: pigmentation changes in radiation field.  Neurological exam reveals Patient follows commands and shook my hand; she did not engage in the conversation.  Pelvic: exam chaperoned by nurse;  Vulva: normal appearing vulva with no masses, tenderness or lesions; Vagina: normal vagina; Adnexa: nontender and no masses; Uterus: unable to determine size, not grossly enlarged and nontender; Cervix: unable to visualize fully. Deviated posteriorly. No abnormalities on palpation. Parametria was smooth bilaterally; Rectal: not indicated    Lab Review N/A   Radiologic Imaging: N/A    Assessment:  KYNZI LEVAY is a 83 y.o. female diagnosed with grade 3 endometrial cancer, non-operable candidate, s/p external beam radiation therapy. Enlarged firm cervical node on exam, stable. History of  8 mm retroperitoneal periaortic lymph node, which is within normal limits. Medical co-morbidities complicating care: Dementia, frailty, and CAD. Plan:      Problem List Items Addressed This Visit     Endometrial cancer        Right cervical adenopathy  Visit Diagnoses    Endometrial cancer    -  Primary    Dementia       I discussed the exam findings with the patient and her daughter. The cervical mass is stable so I do not think this is disease. I also did not recommend a CT scan to follow up the PA node as her mother is very frail and I do not think she could tolerate therapy if she did have a recurrence. I recommend our focus be on quality of life and symptom management, currently her mother is NED. She agreed with this plan. She will follow up and schedule appointment with Sara Heath in 6 months and then return to our clinic in 12 months.    Gillis Ends, MD     CC:  Sara Heath

## 2017-02-03 ENCOUNTER — Telehealth: Payer: Self-pay

## 2017-02-03 NOTE — Telephone Encounter (Signed)
  Oncology Nurse Navigator Documentation Called and spoke with daughter, Leighton Roach, regarding missed appointments. Asked if she would like to reschedule to see Dr. Theora Gianotti. Appointment rescheduled for 11/14 at 10:00 with read back performed. Appointment also mailed to home address. Navigator Location: [facility] (02/03/17 1300)   )Navigator Encounter Type: Telephone (02/03/17 1300) Telephone: Appt Confirmation/Clarification;Outgoing Call (02/03/17 1300)                                                  Time Spent with Patient: 15 (02/03/17 1300)

## 2017-02-08 ENCOUNTER — Encounter: Payer: Self-pay | Admitting: Emergency Medicine

## 2017-02-08 ENCOUNTER — Observation Stay
Admission: EM | Admit: 2017-02-08 | Discharge: 2017-02-09 | Disposition: A | Discharge status: Home / Self Care | Payer: Medicare Other | Attending: Internal Medicine | Admitting: Internal Medicine

## 2017-02-08 ENCOUNTER — Emergency Department: Payer: Medicare Other

## 2017-02-08 DIAGNOSIS — G8929 Other chronic pain: Secondary | ICD-10-CM | POA: Insufficient documentation

## 2017-02-08 DIAGNOSIS — R600 Localized edema: Secondary | ICD-10-CM | POA: Insufficient documentation

## 2017-02-08 DIAGNOSIS — Z880 Allergy status to penicillin: Secondary | ICD-10-CM | POA: Diagnosis not present

## 2017-02-08 DIAGNOSIS — I2510 Atherosclerotic heart disease of native coronary artery without angina pectoris: Secondary | ICD-10-CM | POA: Insufficient documentation

## 2017-02-08 DIAGNOSIS — Z79899 Other long term (current) drug therapy: Secondary | ICD-10-CM | POA: Insufficient documentation

## 2017-02-08 DIAGNOSIS — I10 Essential (primary) hypertension: Secondary | ICD-10-CM | POA: Diagnosis not present

## 2017-02-08 DIAGNOSIS — Z955 Presence of coronary angioplasty implant and graft: Secondary | ICD-10-CM | POA: Diagnosis not present

## 2017-02-08 DIAGNOSIS — F0390 Unspecified dementia without behavioral disturbance: Secondary | ICD-10-CM | POA: Insufficient documentation

## 2017-02-08 DIAGNOSIS — M109 Gout, unspecified: Secondary | ICD-10-CM | POA: Insufficient documentation

## 2017-02-08 DIAGNOSIS — N63 Unspecified lump in unspecified breast: Secondary | ICD-10-CM | POA: Diagnosis not present

## 2017-02-08 DIAGNOSIS — J45909 Unspecified asthma, uncomplicated: Secondary | ICD-10-CM | POA: Insufficient documentation

## 2017-02-08 DIAGNOSIS — R079 Chest pain, unspecified: Secondary | ICD-10-CM | POA: Diagnosis present

## 2017-02-08 DIAGNOSIS — Z7982 Long term (current) use of aspirin: Secondary | ICD-10-CM | POA: Diagnosis not present

## 2017-02-08 DIAGNOSIS — R072 Precordial pain: Secondary | ICD-10-CM | POA: Diagnosis not present

## 2017-02-08 DIAGNOSIS — G4730 Sleep apnea, unspecified: Secondary | ICD-10-CM | POA: Insufficient documentation

## 2017-02-08 DIAGNOSIS — M436 Torticollis: Secondary | ICD-10-CM | POA: Diagnosis not present

## 2017-02-08 DIAGNOSIS — Z8542 Personal history of malignant neoplasm of other parts of uterus: Secondary | ICD-10-CM | POA: Insufficient documentation

## 2017-02-08 LAB — CBC
HCT: 32.7 % — ABNORMAL LOW (ref 35.0–47.0)
Hemoglobin: 11 g/dL — ABNORMAL LOW (ref 12.0–16.0)
MCH: 32.1 pg (ref 26.0–34.0)
MCHC: 33.7 g/dL (ref 32.0–36.0)
MCV: 95.3 fL (ref 80.0–100.0)
PLATELETS: 197 10*3/uL (ref 150–440)
RBC: 3.44 MIL/uL — AB (ref 3.80–5.20)
RDW: 14.9 % — AB (ref 11.5–14.5)
WBC: 2.6 10*3/uL — AB (ref 3.6–11.0)

## 2017-02-08 LAB — BASIC METABOLIC PANEL
Anion gap: 6 (ref 5–15)
BUN: 18 mg/dL (ref 6–20)
CALCIUM: 9.7 mg/dL (ref 8.9–10.3)
CO2: 26 mmol/L (ref 22–32)
CREATININE: 0.78 mg/dL (ref 0.44–1.00)
Chloride: 108 mmol/L (ref 101–111)
GFR calc non Af Amer: >60 mL/min (ref >60)
Glucose, Bld: 84 mg/dL (ref 65–99)
Potassium: 4.2 mmol/L (ref 3.5–5.1)
SODIUM: 140 mmol/L (ref 135–145)

## 2017-02-08 LAB — TROPONIN I: Troponin I: <0.03 ng/mL (ref <0.03)

## 2017-02-08 MED ORDER — NITROGLYCERIN 0.4 MG SL SUBL
0.4000 mg | SUBLINGUAL_TABLET | SUBLINGUAL | Status: DC | PRN
  Administered 2017-02-08: 0.4 mg via SUBLINGUAL
  Filled 2017-02-08: qty 1

## 2017-02-08 MED ORDER — ONDANSETRON HCL 4 MG/2ML IJ SOLN: 4.0000 mg | Freq: Four times a day (QID) | INTRAMUSCULAR | Status: DC | PRN

## 2017-02-08 MED ORDER — ENOXAPARIN SODIUM 30 MG/0.3ML ~~LOC~~ SOLN
30.0000 mg | SUBCUTANEOUS | Status: DC
  Administered 2017-02-08: 30 mg via SUBCUTANEOUS
  Filled 2017-02-08: qty 0.3

## 2017-02-08 MED ORDER — ONDANSETRON HCL 4 MG PO TABS: 4.0000 mg | ORAL_TABLET | Freq: Four times a day (QID) | ORAL | Status: DC | PRN

## 2017-02-08 MED ORDER — LORATADINE 10 MG PO TABS: 10.0000 mg | ORAL_TABLET | Freq: Every day | ORAL | Status: DC | PRN

## 2017-02-08 MED ORDER — ROSUVASTATIN CALCIUM 10 MG PO TABS: 10.0000 mg | ORAL_TABLET | Freq: Every day | ORAL | Status: DC

## 2017-02-08 MED ORDER — POLYETHYLENE GLYCOL 3350 17 G PO PACK: 17.0000 g | PACK | Freq: Every day | ORAL | Status: DC | PRN

## 2017-02-08 MED ORDER — ASPIRIN EC 81 MG PO TBEC
81.0000 mg | DELAYED_RELEASE_TABLET | Freq: Every day | ORAL | Status: DC
  Administered 2017-02-08 – 2017-02-09 (×2): 81 mg via ORAL
  Filled 2017-02-08 (×2): qty 1

## 2017-02-08 MED ORDER — NITROGLYCERIN 0.4 MG SL SUBL: 0.4000 mg | SUBLINGUAL_TABLET | SUBLINGUAL | Status: DC | PRN

## 2017-02-08 MED ORDER — ACETAMINOPHEN 325 MG PO TABS: 650.0000 mg | ORAL_TABLET | Freq: Four times a day (QID) | ORAL | Status: DC | PRN

## 2017-02-08 MED ORDER — TRAMADOL HCL 50 MG PO TABS: 50.0000 mg | ORAL_TABLET | Freq: Four times a day (QID) | ORAL | Status: DC | PRN

## 2017-02-08 MED ORDER — ACETAMINOPHEN 650 MG RE SUPP: 650.0000 mg | Freq: Four times a day (QID) | RECTAL | Status: DC | PRN

## 2017-02-08 MED ORDER — ISOSORBIDE MONONITRATE ER 60 MG PO TB24
120.0000 mg | ORAL_TABLET | Freq: Every day | ORAL | Status: DC
  Administered 2017-02-08 – 2017-02-09 (×2): 120 mg via ORAL
  Filled 2017-02-08 (×2): qty 2

## 2017-02-08 MED ORDER — AMLODIPINE BESYLATE 5 MG PO TABS
5.0000 mg | ORAL_TABLET | Freq: Every day | ORAL | Status: DC
  Administered 2017-02-08 – 2017-02-09 (×2): 5 mg via ORAL
  Filled 2017-02-08 (×2): qty 1

## 2017-02-08 NOTE — ED Notes (Signed)
Patient transported to X-ray 

## 2017-02-08 NOTE — Consult Note (Signed)
[place]  CARDIOLOGY CONSULT NOTE  Patient ID: Sara Heath MRN: [id number] DOB/AGE: 1922-03-23 83 y.o.  Admit date: 02/08/2017 Referring Physician Dr. Benjie Karvonen Primary Physician Dr. Shade Flood Primary Cardiologist Dr. Ubaldo Glassing Reason for Consultation Chest pain  HPI: Patient is a 48 old female with history of hypertension, hyperlipidemia, preserved LV function with ejection fraction of 63%, history of mild aortic stenosis who is admitted with chest pain. Due to relative dementia, she is a difficult historian. She has ruled out for myocardial infarction. EKG revealed sinus rhythm with no ischemia. Chest x-ray revealed no pulmonary edema. She is currently hemodynamically stable on aspirin 81 mg daily, amlodipine 5 mg daily, isosorbide mononitrate 120 mg daily, rosuvastatin 10 mg daily.  Review of Systems  HENT: Negative.   Eyes: Negative.   Respiratory: Negative.   Cardiovascular: Positive for chest pain.  Gastrointestinal: Negative.   Genitourinary: Negative.   Musculoskeletal: Negative.   Skin: Negative.   Neurological: Positive for weakness.  Endo/Heme/Allergies: Negative.   Psychiatric/Behavioral: Negative.     Past Medical History:  Diagnosis Date  . Angioedema   . Asthma   . Breast mass, left   . CAD (coronary artery disease)   . Chronic back pain   . Chronic headache   . Dementia   . Dementia   . Edema of both legs   . Endometrial cancer ([place])   . Gout   . Hematuria   . Hemorrhagic cystitis   . Hypertension   . Malaise and fatigue   . Sleep apnea   . Torticollis   . UTI (lower urinary tract infection)   . Vaginal candidiasis   . Vulvar dystrophy   . Weight loss     Family History  Problem Relation Age of Onset  . Family history unknown: Yes    Social History   Social History  . Marital status: Widowed    Spouse name: N/A  . Number of children: N/A  . Years of education: N/A   Occupational History  .  Not on file.   Social History Main Topics  . Smoking status: Never Smoker  . Smokeless tobacco: Never Used  . Alcohol use No  . Drug use: No  . Sexual activity: Not on file   Other Topics Concern  . Not on file   Social History Narrative  . No narrative on file    Past Surgical History:  Procedure Laterality Date  . CORONARY ANGIOPLASTY WITH STENT PLACEMENT    . left breast biopsy    . tubal ligation       Prescriptions Prior to Admission  Medication Sig Dispense Refill Last Dose  . amLODipine (NORVASC) 5 MG tablet Take 1 tablet (5 mg total) by mouth daily. 30 tablet 0 02/07/2017 at 0800  . aspirin EC 81 MG tablet Take 81 mg by mouth daily.    02/07/2017 at 0800  . isosorbide mononitrate (IMDUR) 120 MG 24 hr tablet Take 120 mg by mouth daily.    02/07/2017 at 0800  . nitroGLYCERIN (NITROSTAT) 0.4 MG SL tablet Place 0.4 mg under the tongue every 5 (five) minutes x 3 doses as needed for chest pain. *if not resolved , go to emergency department.*   02/08/2017 at Unknown time  . RA COL-RITE 100 MG capsule take 100 mg by mouth twice a day if needed for STOOL SOFTENING  0 02/07/2017 at 0800  . cetirizine (ZYRTEC) 10 MG tablet Take 10 mg by  mouth daily as needed for allergies.    prn at prn    Physical Exam: Blood pressure (!) 161/79, pulse (!) 56, temperature (!) 97.5 F (36.4 C), temperature source Oral, resp. rate 18, height 5\' 3"  (1.6 m), weight 54.4 kg (120 lb), SpO2 93 %.   Wt Readings from Last 1 Encounters:  02/08/17 54.4 kg (120 lb)     General appearance: cooperative and slowed mentation Resp: clear to auscultation bilaterally Chest wall: right sided chest wall tenderness, left sided chest wall tenderness Cardio: regular rate and rhythm and systolic murmur: late systolic 2/6, crescendo and decrescendo at lower left sternal border GI: soft, non-tender; bowel sounds normal; no masses,  no organomegaly Extremities: extremities normal, atraumatic, no cyanosis or  edema Neurologic: Mental status: alertness: lethargic  Labs:   Lab Results  Component Value Date   WBC 2.6 (L) 02/08/2017   HGB 11.0 (L) 02/08/2017   HCT 32.7 (L) 02/08/2017   MCV 95.3 02/08/2017   PLT 197 02/08/2017    Recent Labs Lab 02/08/17 0832  NA 140  K 4.2  CL 108  CO2 26  BUN 18  CREATININE 0.78  CALCIUM 9.7  GLUCOSE 84   Lab Results  Component Value Date   CKTOTAL 71 05/08/2013   CKMB 1.2 05/08/2013   TROPONINI <0.03 02/08/2017      Radiology:No acute cardiopulmonary disease EKG: Sinus rhythm with no obvious ischemia  ASSESSMENT AND PLAN:  83 year old female with history of mild-to-moderate aortic stenosis, preserved LV function, dementia who is admitted with chest pain. She does far is ruled out for myocardial infarction. She is not a very good historian but does complain of some discomfort. There is some mild chest wall tenderness. She is hemodynamically stable on amlodipine, isosorbide mononitrate. She is not on a beta blocker and would defer this at present due to concern over bradycardia. She is not a candidate for invasive or noninvasive ischemia workup. Would continue with long-acting nitrates. Would discontinue Crestor given her advanced age. This will help determine whether or not to statin is causing some of her myalgias. Not a candidate for heparin. Signed: Teodoro Spray MD, [facility] 02/08/2017, 1:49 PM

## 2017-02-08 NOTE — ED Triage Notes (Signed)
Pt arrived via POV from home with daughter. Pt c/o central chest pain that started last night.  Daughter gave patient NTG x2 last night which helped with the pain.  Daughter states patient woke up again this morning with chest pain and gave her another NTG today.  Pt continues to have chest pain. Pt states the chest pain is 10/10 currently.

## 2017-02-08 NOTE — ED Notes (Signed)
ED Provider at bedside. 

## 2017-02-08 NOTE — H&P (Signed)
[facility] NAME: Sara Heath    MR#:  [id number]  DATE OF BIRTH:  [birth year]  DATE OF ADMISSION:  02/08/2017  PRIMARY CARE PHYSICIAN: Petra Kuba, MD   REQUESTING/REFERRING PHYSICIAN: dr Joni Fears  CHIEF COMPLAINT:   Chest pain HISTORY OF PRESENT ILLNESS:  Sara Heath  is a 83 y.o. female with a known history of coronary artery disease and dementia who presents with above complaint. Daughter who is at bedside reports that approximately 2:30 AM A she was hollering out and complaining of chest pain. Her daughter gave her nitroglycerin and patient continued to have chest pain she gave her another nitroglycerin and the chest pain had subsided. Patient then woke up this morning at approximate 7 AM complaining again of substernal chest pain. There is no nausea, vomiting or shortness of breath associated with chest pain. The chest pain apparently did not radiate to the arms or jaws. She was brought to the ER for further evaluation. In the emergency room her first set of troponins are negative. EKG shows no ST elevation or depression.  PAST MEDICAL HISTORY:   Past Medical History:  Diagnosis Date  . Angioedema   . Asthma   . Breast mass, left   . CAD (coronary artery disease)   . Chronic back pain   . Chronic headache   . Dementia   . Dementia   . Edema of both legs   . Endometrial cancer ([place])   . Gout   . Hematuria   . Hemorrhagic cystitis   . Hypertension   . Malaise and fatigue   . Sleep apnea   . Torticollis   . UTI (lower urinary tract infection)   . Vaginal candidiasis   . Vulvar dystrophy   . Weight loss     PAST SURGICAL HISTORY:   Past Surgical History:  Procedure Laterality Date  . CORONARY ANGIOPLASTY WITH STENT PLACEMENT    . left breast biopsy    . tubal ligation      SOCIAL HISTORY:   Social History  Substance Use Topics  . Smoking status: Never Smoker  . Smokeless tobacco: Never Used  .  Alcohol use No    FAMILY HISTORY:  hypertension  DRUG ALLERGIES:   Allergies  Allergen Reactions  . Penicillins Anaphylaxis    Has patient had a PCN reaction causing immediate rash, facial/tongue/throat swelling, SOB or lightheadedness with hypotension: Yes Has patient had a PCN reaction causing severe rash involving mucus membranes or skin necrosis: No Has patient had a PCN reaction that required hospitalization No Has patient had a PCN reaction occurring within the last 10 years: No If all of the above answers are "NO", then may proceed with Cephalosporin use.    REVIEW OF SYSTEMS:   Review of Systems  Constitutional: Negative.  Negative for chills, fever and malaise/fatigue.  HENT: Negative.  Negative for ear discharge, ear pain, hearing loss, nosebleeds and sore throat.   Eyes: Negative.  Negative for blurred vision and pain.  Respiratory: Negative.  Negative for cough, hemoptysis, shortness of breath and wheezing.   Cardiovascular: Positive for chest pain. Negative for palpitations and leg swelling.  Gastrointestinal: Negative.  Negative for abdominal pain, blood in stool, diarrhea, nausea and vomiting.  Genitourinary: Negative.  Negative for dysuria.  Musculoskeletal: Negative.  Negative for back pain.  Skin: Negative.   Neurological: Negative for dizziness, tremors, speech change, focal weakness, seizures and headaches.  Endo/Heme/Allergies: Negative.  Does not  bruise/bleed easily.  Psychiatric/Behavioral: Negative.  Negative for depression, hallucinations and suicidal ideas.    MEDICATIONS AT HOME:   Prior to Admission medications   Medication Sig Start Date End Date Taking? Authorizing Provider  amLODipine (NORVASC) 5 MG tablet Take 1 tablet (5 mg total) by mouth daily. 05/19/16  Yes Fritzi Mandes, MD  aspirin EC 81 MG tablet Take 81 mg by mouth daily.    Yes [provider]  isosorbide mononitrate (IMDUR) 120 MG 24 hr tablet Take 120 mg by mouth daily.   11/13/14  Yes [provider]  nitroGLYCERIN (NITROSTAT) 0.4 MG SL tablet Place 0.4 mg under the tongue every 5 (five) minutes x 3 doses as needed for chest pain. *if not resolved , go to emergency department.*   Yes [provider]  RA COL-RITE 100 MG capsule take 100 mg by mouth twice a day if needed for STOOL SOFTENING 10/01/14  Yes [provider]  cetirizine (ZYRTEC) 10 MG tablet Take 10 mg by mouth daily as needed for allergies.  11/26/14   [provider]      VITAL SIGNS:  Blood pressure 135/77, pulse 66, temperature 97.6 F (36.4 C), temperature source Oral, resp. rate 16, height 5\' 3"  (1.6 m), weight 54.4 kg (120 lb), SpO2 94 %.  PHYSICAL EXAMINATION:   Physical Exam  Constitutional: She is oriented to person, place, and time and well-developed, well-nourished, and in no distress. No distress.  HENT:  Head: Normocephalic.  Eyes: No scleral icterus.  Neck: Normal range of motion. Neck supple. No JVD present. No tracheal deviation present.  Cardiovascular: Normal rate and regular rhythm.  Exam reveals no gallop and no friction rub.   Murmur heard. Pulmonary/Chest: Effort normal and breath sounds normal. No respiratory distress. She has no wheezes. She has no rales. She exhibits no tenderness.  Abdominal: Soft. Bowel sounds are normal. She exhibits no distension and no mass. There is no tenderness. There is no rebound and no guarding.  Musculoskeletal: Normal range of motion. She exhibits edema. She exhibits no tenderness.  Neurological: She is alert and oriented to person, place, and time.  Skin: Skin is warm. No rash noted. No erythema.  Psychiatric: Affect and judgment normal.      LABORATORY PANEL:   CBC  Recent Labs Lab 02/08/17 0832  WBC 2.6*  HGB 11.0*  HCT 32.7*  PLT 197   ------------------------------------------------------------------------------------------------------------------  Chemistries   Recent Labs Lab  02/08/17 0832  NA 140  K 4.2  CL 108  CO2 26  GLUCOSE 84  BUN 18  CREATININE 0.78  CALCIUM 9.7   ------------------------------------------------------------------------------------------------------------------  Cardiac Enzymes  Recent Labs Lab 02/08/17 0832  TROPONINI <0.03   ------------------------------------------------------------------------------------------------------------------  RADIOLOGY:  Dg Chest 2 View  Result Date: 02/08/2017 CLINICAL DATA:  Onset of mid chest pain last night relieved by nitroglycerin. Recurrent symptoms today not responsive to nitroglycerin. History of asthma, coronary artery disease with stent placement, and dementia. EXAM: CHEST  2 VIEW COMPARISON:  Chest x-ray of August 11, 2016 FINDINGS: The lungs are adequately inflated. There is no focal infiltrate. The cardiac silhouette is mildly enlarged. The pulmonary vascularity is normal. There is tortuosity of the ascending and descending thoracic aorta with mural calcification in the arch. There is stable calcification in the left hilar region. There is no pleural effusion or pneumothorax. There is mild multilevel degenerative disc disease of the thoracic spine. There degenerative changes of both shoulders. IMPRESSION: Mild stable enlargement of the cardiac silhouette without  evidence of pulmonary vascular congestion or pulmonary edema. No acute pneumonia nor other acute cardiopulmonary abnormality. Thoracic aortic atherosclerosis. Electronically Signed   By: David  Martinique M.D.   On: 02/08/2017 09:02    EKG:  nsr no st elevation or depression  IMPRESSION AND PLAN:   83 year old female with history of CAD essential hypertension, dementia and chronic lower extremity edema who presented chest pain.   1. Chest pain: Continue to trend troponins Continue telemetry monitoring [facility] cardiology consultation Continue aspirin and isosorbide Has had issues with low heart rate in the past due to beta  blocker.  2. Essential hypertension: Continue isosorbide and Norvasc  3. CAD: Continue aspirin and isosorbide Add statin   4. Dementia 5. Chronic lower extremity edema: On today's exam she has 1+ lower extremity edema   All the records are reviewed and case discussed with ED provider. Management plans discussed with the patient and Daughter and they are in agreement  CODE STATUS:  Full code  TOTAL TIME TAKING CARE OF THIS PATIENT: 41 minutes.    Uzziel Russey M.D on 02/08/2017 at 10:47 AM  Between 7am to 6pm - Pager - [phone]  After 6pm go to [email or web address] - password [email or web address] [facility]  Office  [phone]  CC: Primary care physician; Petra Kuba, MD

## 2017-02-08 NOTE — ED Provider Notes (Signed)
[facility] Emergency Department Provider Note  ____________________________________________  Time seen: Approximately 9:54 AM  I have reviewed the triage vital signs and the nursing notes.   HISTORY  Chief Complaint Chest Pain    HPI Sara Heath is a 83 y.o. female brought to the ED with central pad chest pain that started at 2:00 AM waking the patient from sleep. Radiates to bilateral arms, associated with shortness of breath. No diaphoresis or vomiting. Has been having dyspnea on exertion for the past several days as well. Daughter gave nitroglycerin at home which temporarily helped but then pain gets worse again. Pain is still present. No aggravating factors. No cough fevers chills or sweats. No vomiting. Pain is been constant since onset.     Past Medical History:  Diagnosis Date  . Angioedema   . Asthma   . Breast mass, left   . CAD (coronary artery disease)   . Chronic back pain   . Chronic headache   . Dementia   . Dementia   . Edema of both legs   . Endometrial cancer ([place])   . Gout   . Hematuria   . Hemorrhagic cystitis   . Hypertension   . Malaise and fatigue   . Sleep apnea   . Torticollis   . UTI (lower urinary tract infection)   . Vaginal candidiasis   . Vulvar dystrophy   . Weight loss      Patient Active Problem List   Diagnosis Date Noted  . Malnutrition of moderate degree 09/08/2015  . Pancytopenia ([place]) 09/08/2015  . Acute cystitis with hematuria 09/08/2015  . General weakness 09/08/2015  . Dementia 09/08/2015  . Syncope 09/06/2015  . CAD (coronary artery disease) 09/06/2015  . HTN, goal below 140/80 09/06/2015  . History of endometrial cancer 09/06/2015     Past Surgical History:  Procedure Laterality Date  . CORONARY ANGIOPLASTY WITH STENT PLACEMENT    . left breast biopsy    . tubal ligation       Prior to Admission medications   Medication Sig Start Date End Date Taking? Authorizing Provider   amLODipine (NORVASC) 5 MG tablet Take 1 tablet (5 mg total) by mouth daily. 05/19/16  Yes Fritzi Mandes, MD  aspirin EC 81 MG tablet Take 81 mg by mouth daily.    Yes [provider]  isosorbide mononitrate (IMDUR) 120 MG 24 hr tablet Take 120 mg by mouth daily.  11/13/14  Yes [provider]  nitroGLYCERIN (NITROSTAT) 0.4 MG SL tablet Place 0.4 mg under the tongue every 5 (five) minutes x 3 doses as needed for chest pain. *if not resolved , go to emergency department.*   Yes [provider]  RA COL-RITE 100 MG capsule take 100 mg by mouth twice a day if needed for STOOL SOFTENING 10/01/14  Yes [provider]  cetirizine (ZYRTEC) 10 MG tablet Take 10 mg by mouth daily as needed for allergies.  11/26/14   [provider]     Allergies Penicillins   Family History  Problem Relation Age of Onset  . Family history unknown: Yes    Social History Social History  Substance Use Topics  . Smoking status: Never Smoker  . Smokeless tobacco: Never Used  . Alcohol use No    Review of Systems  Constitutional:   No fever or chills.  ENT:   No sore throat. No rhinorrhea. Cardiovascular:  positive as above chest pain without syncope. Respiratory:   positive  as above shortness of breath without cough. Gastrointestinal:   Negative for abdominal pain, vomiting and diarrhea.  Musculoskeletal:   Negative for focal pain or swelling All other systems reviewed and are negative except as documented above in ROS and HPI.  ____________________________________________   PHYSICAL EXAM:  VITAL SIGNS: ED Triage Vitals  Enc Vitals Group     BP 02/08/17 0821 135/77     Pulse Rate 02/08/17 0821 66     Resp 02/08/17 0821 16     Temp 02/08/17 0821 97.6 F (36.4 C)     Temp Source 02/08/17 0821 Oral     SpO2 02/08/17 0821 94 %     Weight 02/08/17 0821 120 lb (54.4 kg)     Height 02/08/17 0821 5\' 3"  (1.6 m)     Head Circumference --      Peak Flow --      Pain  Score 02/08/17 0844 10     Pain Loc --      Pain Edu? --      Excl. in [place]? --     Vital signs reviewed, nursing assessments reviewed.   Constitutional:   Alert and oriented. not in distress. Eyes:   No scleral icterus.  EOMI. No nystagmus. No conjunctival pallor. PERRL. ENT   Head:   Normocephalic and atraumatic.   Nose:   No congestion/rhinnorhea.    Mouth/Throat:   MMM, no pharyngeal erythema. No peritonsillar mass.    Neck:   No meningismus. Full ROM. Hematological/Lymphatic/Immunilogical:   No cervical lymphadenopathy. Cardiovascular:   RRR. Symmetric bilateral radial and DP pulses.  No murmurs.  Respiratory:   Normal respiratory effort without tachypnea/retractions. Breath sounds are clear and equal bilaterally. No wheezes/rales/rhonchi. Gastrointestinal:   Soft and nontender. Non distended. There is no CVA tenderness.  No rebound, rigidity, or guarding. Genitourinary:   deferred Musculoskeletal:   Normal range of motion in all extremities. No joint effusions.  No lower extremity tenderness.  No edema.chest wall nontender Neurologic:   Normal speech and language.  Motor grossly intact. No gross focal neurologic deficits are appreciated.  Skin:    Skin is warm, dry and intact. No rash noted.  No petechiae, purpura, or bullae.  ____________________________________________    LABS (pertinent positives/negatives) (all labs ordered are listed, but only abnormal results are displayed) Labs Reviewed  CBC - Abnormal; Notable for the following:       Result Value   WBC 2.6 (*)    RBC 3.44 (*)    Hemoglobin 11.0 (*)    HCT 32.7 (*)    RDW 14.9 (*)    All other components within normal limits  BASIC METABOLIC PANEL  TROPONIN I   ____________________________________________   EKG  interpreted by me Normal sinus rhythm rate of 66, normal axis and intervals. Poor R-wave progression in anterior precordial leads. Normal ST segments and T  waves.  ____________________________________________    RADIOLOGY  Dg Chest 2 View  Result Date: 02/08/2017 CLINICAL DATA:  Onset of mid chest pain last night relieved by nitroglycerin. Recurrent symptoms today not responsive to nitroglycerin. History of asthma, coronary artery disease with stent placement, and dementia. EXAM: CHEST  2 VIEW COMPARISON:  Chest x-ray of August 11, 2016 FINDINGS: The lungs are adequately inflated. There is no focal infiltrate. The cardiac silhouette is mildly enlarged. The pulmonary vascularity is normal. There is tortuosity of the ascending and descending thoracic aorta with mural calcification in the arch. There is stable calcification in the left hilar  region. There is no pleural effusion or pneumothorax. There is mild multilevel degenerative disc disease of the thoracic spine. There degenerative changes of both shoulders. IMPRESSION: Mild stable enlargement of the cardiac silhouette without evidence of pulmonary vascular congestion or pulmonary edema. No acute pneumonia nor other acute cardiopulmonary abnormality. Thoracic aortic atherosclerosis. Electronically Signed   By: David  Martinique M.D.   On: 02/08/2017 09:02    ____________________________________________   PROCEDURES Procedures  ____________________________________________   DIFFERENTIAL DIAGNOSIS  non-STEMI, GERD, pneumonia  CLINICAL IMPRESSION / ASSESSMENT AND PLAN / ED COURSE  Pertinent labs & imaging results that were available during my care of the patient were reviewed by me and considered in my medical decision making (see chart for details).   low suspicion of pneumothorax or dissection. No evidence of pericarditis. Patient presents with central chest pain radiating to the bilateral arms associated with shortness of breath, temporarily improved by nitroglycerin but persistent. has a history of CAD.  EKG nondiagnostic, troponin negative, plan to hospitalize for further evaluation and  risk stratification.     ____________________________________________   FINAL CLINICAL IMPRESSION(S) / ED DIAGNOSES    Final diagnoses:  Precordial pain      New Prescriptions   No medications on file     Portions of this note were generated with dragon dictation software. Dictation errors may occur despite best attempts at proofreading.    Carrie Mew, MD 02/08/17 [phone]

## 2017-02-08 NOTE — ED Notes (Signed)
Secretary and RN Abigail report we can bring patient up in 10 minutes. House keeping is working on [employer]

## 2017-02-08 NOTE — Care Management Obs Status (Signed)
[place] NOTIFICATION   Patient Details  Name: Sara Heath MRN: [id number] Date of Birth: December 03, 1921   Medicare Observation Status Notification Given:  Yes (delivered to daughter(s) and patient)    Marshell Garfinkel, RN 02/08/2017, 1:46 PM

## 2017-02-09 DIAGNOSIS — R072 Precordial pain: Secondary | ICD-10-CM | POA: Diagnosis not present

## 2017-02-09 LAB — CBC
HCT: 32.3 % — ABNORMAL LOW (ref 35.0–47.0)
Hemoglobin: 10.6 g/dL — ABNORMAL LOW (ref 12.0–16.0)
MCH: 31.2 pg (ref 26.0–34.0)
MCHC: 32.9 g/dL (ref 32.0–36.0)
MCV: 95 fL (ref 80.0–100.0)
PLATELETS: 182 10*3/uL (ref 150–440)
RBC: 3.39 MIL/uL — ABNORMAL LOW (ref 3.80–5.20)
RDW: 15 % — AB (ref 11.5–14.5)
WBC: 2.9 10*3/uL — ABNORMAL LOW (ref 3.6–11.0)

## 2017-02-09 LAB — TROPONIN I: Troponin I: <0.03 ng/mL (ref <0.03)

## 2017-02-09 LAB — BASIC METABOLIC PANEL
Anion gap: 6 (ref 5–15)
BUN: 22 mg/dL — AB (ref 6–20)
CALCIUM: 9.2 mg/dL (ref 8.9–10.3)
CHLORIDE: 105 mmol/L (ref 101–111)
CO2: 26 mmol/L (ref 22–32)
CREATININE: 0.83 mg/dL (ref 0.44–1.00)
GFR calc Af Amer: >60 mL/min (ref >60)
GFR calc non Af Amer: 59 mL/min — ABNORMAL LOW (ref >60)
GLUCOSE: 105 mg/dL — AB (ref 65–99)
Potassium: 3.9 mmol/L (ref 3.5–5.1)
Sodium: 137 mmol/L (ref 135–145)

## 2017-02-09 MED ORDER — INFLUENZA VAC SPLIT HIGH-DOSE 0.5 ML IM SUSY: 0.5000 mL | PREFILLED_SYRINGE | INTRAMUSCULAR | Status: DC

## 2017-02-09 MED ORDER — INFLUENZA VAC SPLIT HIGH-DOSE 0.5 ML IM SUSY
0.5000 mL | PREFILLED_SYRINGE | Freq: Once | INTRAMUSCULAR | Status: DC
  Filled 2017-02-09: qty 0.5

## 2017-02-09 NOTE — Progress Notes (Signed)
IV and tele removed from patient. Discharge instructions given to patient and daughter. Verbalized understanding. No distress at this time. Daughter at bedside and will be transporting patient home.

## 2017-02-09 NOTE — Plan of Care (Signed)
Problem: Education: Goal: Knowledge of [facility] Education information/materials will improve Outcome: Progressing No complaints of pain this shift, pt is confused, only alert to self & place. Can be impulsive when needing to use the bathroom, up with 1 assist, tolerated well.

## 2017-02-10 NOTE — Discharge Summary (Signed)
[facility] [place] NAME: Sara Heath    MR#:  [id number]  DATE OF BIRTH:  Mar 07, 1922  DATE OF ADMISSION:  02/08/2017 ADMITTING PHYSICIAN: Bettey Costa, MD  DATE OF DISCHARGE: 02/09/2017  1:48 PM  PRIMARY CARE PHYSICIAN: Petra Kuba, MD    ADMISSION DIAGNOSIS:  Precordial pain [R07.2]  DISCHARGE DIAGNOSIS:  Active Problems:   Chest pain   SECONDARY DIAGNOSIS:   Past Medical History:  Diagnosis Date  . Angioedema   . Asthma   . Breast mass, left   . CAD (coronary artery disease)   . Chronic back pain   . Chronic headache   . Dementia   . Dementia   . Edema of both legs   . Endometrial cancer ([place])   . Gout   . Hematuria   . Hemorrhagic cystitis   . Hypertension   . Malaise and fatigue   . Sleep apnea   . Torticollis   . UTI (lower urinary tract infection)   . Vaginal candidiasis   . Vulvar dystrophy   . Weight loss     HOSPITAL COURSE:   83 year old female with history of CAD essential hypertension, dementia and chronic lower extremity edema who presented chest pain.   1. Chest pain: Continue to trend troponins- remained stable. Continue telemetry monitoring [facility] cardiology consultation appreciated. Continue aspirin and isosorbide Has had issues with low heart rate in the past due to beta blocker. Stopped statin, pain was better controlled, Cardiologist suggested no further work ups.  2. Essential hypertension: Continue isosorbide and Norvasc  3. CAD: Continue aspirin and isosorbide Add statin   4. Dementia 5. Chronic lower extremity edema: On today's exam she has 1+ lower extremity edema  DISCHARGE CONDITIONS:   Stable.  CONSULTS OBTAINED:  Treatment Team:  Teodoro Spray, MD  DRUG ALLERGIES:   Allergies  Allergen Reactions  . Penicillins Anaphylaxis    Has patient had a PCN reaction causing immediate rash, facial/tongue/throat swelling, SOB or lightheadedness with  hypotension: Yes Has patient had a PCN reaction causing severe rash involving mucus membranes or skin necrosis: No Has patient had a PCN reaction that required hospitalization No Has patient had a PCN reaction occurring within the last 10 years: No If all of the above answers are "NO", then may proceed with Cephalosporin use.    DISCHARGE MEDICATIONS:   Discharge Medication List as of 02/09/2017 12:54 PM    CONTINUE these medications which have NOT CHANGED   Details  amLODipine (NORVASC) 5 MG tablet Take 1 tablet (5 mg total) by mouth daily., Starting Thu 05/19/2016, Normal    aspirin EC 81 MG tablet Take 81 mg by mouth daily. , Historical Med    cetirizine (ZYRTEC) 10 MG tablet Take 10 mg by mouth daily as needed for allergies. , Starting Wed 11/26/2014, Historical Med    isosorbide mononitrate (IMDUR) 120 MG 24 hr tablet Take 120 mg by mouth daily. , Starting Thu 11/13/2014, Historical Med    nitroGLYCERIN (NITROSTAT) 0.4 MG SL tablet Place 0.4 mg under the tongue every 5 (five) minutes x 3 doses as needed for chest pain. *if not resolved , go to emergency department.*, Historical Med    RA COL-RITE 100 MG capsule take 100 mg by mouth twice a day if needed for STOOL SOFTENING, Historical Med         DISCHARGE INSTRUCTIONS:    Follow with PMD in 1-2 days.  If you experience worsening  of your admission symptoms, develop shortness of breath, life threatening emergency, suicidal or homicidal thoughts you must seek medical attention immediately by calling 911 or calling your MD immediately  if symptoms less severe.  You Must read complete instructions/literature along with all the possible adverse reactions/side effects for all the Medicines you take and that have been prescribed to you. Take any new Medicines after you have completely understood and accept all the possible adverse reactions/side effects.   Please note  You were cared for by a hospitalist during your hospital stay.  If you have any questions about your discharge medications or the care you received while you were in the hospital after you are discharged, you can call the unit and asked to speak with the hospitalist on call if the hospitalist that took care of you is not available. Once you are discharged, your primary care physician will handle any further medical issues. Please note that NO REFILLS for any discharge medications will be authorized once you are discharged, as it is imperative that you return to your primary care physician (or establish a relationship with a primary care physician if you do not have one) for your aftercare needs so that they can reassess your need for medications and monitor your lab values.    Today   CHIEF COMPLAINT:   Chief Complaint  Patient presents with  . Chest Pain    HISTORY OF PRESENT ILLNESS:  Sara Heath  is a 83 y.o. female with a known history of coronary artery disease and dementia who presents with above complaint. Daughter who is at bedside reports that approximately 2:30 AM A she was hollering out and complaining of chest pain. Her daughter gave her nitroglycerin and patient continued to have chest pain she gave her another nitroglycerin and the chest pain had subsided. Patient then woke up this morning at approximate 7 AM complaining again of substernal chest pain. There is no nausea, vomiting or shortness of breath associated with chest pain. The chest pain apparently did not radiate to the arms or jaws. She was brought to the ER for further evaluation. In the emergency room her first set of troponins are negative. EKG shows no ST elevation or depression.   VITAL SIGNS:  Blood pressure 117/73, pulse 68, temperature 97.6 F (36.4 C), temperature source Oral, resp. rate 19, height 5\' 3"  (1.6 m), weight 54.4 kg (120 lb), SpO2 100 %.  I/O:   Intake/Output Summary (Last 24 hours) at 02/10/17 0743 Last data filed at 02/09/17 1031  Gross per 24 hour   Intake              360 ml  Output             1200 ml  Net             -840 ml    PHYSICAL EXAMINATION:   Constitutional: She is oriented to person, place, and time and well-developed, well-nourished, and in no distress. No distress.  HENT:  Head: Normocephalic.  Eyes: No scleral icterus.  Neck: Normal range of motion. Neck supple. No JVD present. No tracheal deviation present.  Cardiovascular: Normal rate and regular rhythm.  Exam reveals no gallop and no friction rub.   Murmur heard. Pulmonary/Chest: Effort normal and breath sounds normal. No respiratory distress. She has no wheezes. She has no rales. She exhibits no tenderness.  Abdominal: Soft. Bowel sounds are normal. She exhibits no distension and no mass. There is no tenderness. There is  no rebound and no guarding.  Musculoskeletal: Normal range of motion. She exhibits edema. She exhibits no tenderness.  Neurological: She is alert and oriented to person, place, and time.  Skin: Skin is warm. No rash noted. No erythema.  Psychiatric: Affect and judgment normal.   DATA REVIEW:   CBC  Recent Labs Lab 02/09/17 0025  WBC 2.9*  HGB 10.6*  HCT 32.3*  PLT 182    Chemistries   Recent Labs Lab 02/09/17 0025  NA 137  K 3.9  CL 105  CO2 26  GLUCOSE 105*  BUN 22*  CREATININE 0.83  CALCIUM 9.2    Cardiac Enzymes  Recent Labs Lab 02/09/17 0025  TROPONINI <0.03    Microbiology Results  Results for orders placed or performed during the hospital encounter of 09/06/15  Urine culture     Status: Abnormal   Collection Time: 09/06/15  9:06 PM  Result Value Ref Range Status   Specimen Description URINE, CATHETERIZED  Final   Special Requests NONE  Final   Culture >=100,000 COLONIES/mL CITROBACTER SPECIES (A)  Final   Report Status 09/09/2015 FINAL  Final   Organism ID, Bacteria CITROBACTER SPECIES (A)  Final      Susceptibility   Citrobacter species - MIC*    CEFAZOLIN >=64 RESISTANT Resistant     CEFTRIAXONE  <=1 SENSITIVE Sensitive     CIPROFLOXACIN <=0.25 SENSITIVE Sensitive     GENTAMICIN <=1 SENSITIVE Sensitive     IMIPENEM 0.5 SENSITIVE Sensitive     NITROFURANTOIN <=16 SENSITIVE Sensitive     TRIMETH/SULFA <=20 SENSITIVE Sensitive     PIP/TAZO <=4 SENSITIVE Sensitive     * >=100,000 COLONIES/mL CITROBACTER SPECIES    RADIOLOGY:  Dg Chest 2 View  Result Date: 02/08/2017 CLINICAL DATA:  Onset of mid chest pain last night relieved by nitroglycerin. Recurrent symptoms today not responsive to nitroglycerin. History of asthma, coronary artery disease with stent placement, and dementia. EXAM: CHEST  2 VIEW COMPARISON:  Chest x-ray of August 11, 2016 FINDINGS: The lungs are adequately inflated. There is no focal infiltrate. The cardiac silhouette is mildly enlarged. The pulmonary vascularity is normal. There is tortuosity of the ascending and descending thoracic aorta with mural calcification in the arch. There is stable calcification in the left hilar region. There is no pleural effusion or pneumothorax. There is mild multilevel degenerative disc disease of the thoracic spine. There degenerative changes of both shoulders. IMPRESSION: Mild stable enlargement of the cardiac silhouette without evidence of pulmonary vascular congestion or pulmonary edema. No acute pneumonia nor other acute cardiopulmonary abnormality. Thoracic aortic atherosclerosis. Electronically Signed   By: David  Martinique M.D.   On: 02/08/2017 09:02    EKG:   Orders placed or performed during the hospital encounter of 02/08/17  . EKG 12-Lead  . EKG 12-Lead  . ED EKG within 10 minutes  . ED EKG within 10 minutes      Management plans discussed with the patient, family and they are in agreement.  CODE STATUS:  Code Status History    Date Active Date Inactive Code Status Order ID Comments User Context   02/08/2017 12:34 PM 02/09/2017  4:48 PM Full Code [id number]  Bettey Costa, MD Inpatient   05/17/2016  7:01 PM 05/18/2016  7:40  PM Full Code [id number]  Epifanio Lesches, MD ED   09/06/2015 10:00 PM 09/08/2015  5:42 PM Full Code [id number]  Idelle Crouch, MD Inpatient      TOTAL TIME TAKING CARE OF THIS  PATIENT: 35 minutes.    Vaughan Basta M.D on 02/10/2017 at 7:43 AM  Between 7am to 6pm - Pager - [phone]  After 6pm go to [email or web address] - password [email or web address] [facility]  Office  [phone]  CC: Primary care physician; Petra Kuba, MD   Note: This dictation was prepared with Dragon dictation along with smaller phrase technology. Any transcriptional errors that result from this process are unintentional.

## 2017-03-06 NOTE — Progress Notes (Deleted)
Gynecologic Oncology Interval Visit   Referring Heath: Dr. Laverta [place]  Chief Concern: endometrial cancer  Subjective:  Sara Heath is a 83 y.o. female who is seen in consultation from Dr. Ouida Sills for poorly differentiated endometrial cancer. Of note Sara Heath has dementia and is not capable of making her own decisions. She does not have HCPOA, but she does have a very supportive family.   ***Presents today for surveillance. She has no complaints of vaginal bleeding. At her last visit she was noted to have an enlarged cervical node. PET was ordered but not scheduled by patient.   She was recently admitted for chest pain on 02/08/2017 and DC'd on 02/09/2017.   Gynecologic Oncology  Sara Heath presented with abnormal vaginal bleeding. She had a pelvic ultrasound on 12/07/2014 that revealed the following:   Measurements: Uterus 10.5 x 3.9 x 6.7 cm. Multiple calcifications are noted throughout the uterus. These are similar to that seen on a prior CT examination. These are not felt to represent uterine fibroids but more likely parenchymal calcification.  Endometrium  Thickness: 7.7 mm.  Right ovary Not well visualized Left ovary Measurements: 1.8 x 0.8 x 1.3 cm.   EMBx poorly differentiated endometrioid adenocarcinoma  12/22/2014  CXR: No acute cardiopulmonary disease. CT A/P: IMPRESSION: 1. Similar to prior CT scan there is a large volume of sludge and perhaps numerous small calculi within the gallbladder. There is mild but progressive dilatation of the extrahepatic and intrahepatic bile ducts as well as of the pancreatic duct. Although there is no pancreatic head mass, consider MRCP to further investigate. 2. Small adrenal nodules too small to characterize. Adrenal metastasis would be an unusual manifestation that endometrial cancer, but the possibility is not excluded. 3. Air within the endometrial canal presumably due to recent instrumentation 4. 8 mm  retroperitoneal periaortic lymph node. Other smaller pelvic lymph nodes as described above. 5. Staghorn calculus left kidney   I previously contacted Sara Heath, Sara Heath [phone], regarding her CXR and CT scan. There is no definitive evidence of cancer. On CT scan she did have a 8 mm PA node which is not grossly enlarged. I recommended a cardiology consult for clearance and she saw Dr. Venetia Maxon at [facility]. She was seen by Dr. Alycia Rossetti on 02/13/2015. I spoke with him personally. She has a 5% risk of MI and at least a 5% risk of cardiac failure. No further testing was done as he did not feel it would offer further valuable information. We recommended radiation therapy.    She received external beam radiation therapy 05/2015. She was seen at [facility] for consideration of brachytherapy and that was declined.    Past Medical History: Past Medical History:  Diagnosis Date  . Angioedema   . Asthma   . Breast mass, left   . CAD (coronary artery disease)   . Chronic back pain   . Chronic headache   . Dementia   . Dementia   . Edema of both legs   . Endometrial cancer ([place])   . Gout   . Hematuria   . Hemorrhagic cystitis   . Hypertension   . Malaise and fatigue   . Sleep apnea   . Torticollis   . UTI (lower urinary tract infection)   . Vaginal candidiasis   . Vulvar dystrophy   . Weight loss     Past Surgical History: Past Surgical History:  Procedure Laterality Date  . CORONARY ANGIOPLASTY WITH STENT PLACEMENT    .  left breast biopsy    . tubal ligation      Past Gynecologic History:  See HPI  OB History:  OB History  Gravida Para Term Preterm AB SAB TAB Ectopic Multiple Living  11 8            Family History: Family History  Family history unknown: Yes    Social History: Social History   Socioeconomic History  . Marital status: Widowed    Spouse name: Not on file  . Number of children: Not on file  . Years of education: Not on file  .  Highest education level: Not on file  Social Needs  . Financial resource strain: Not on file  . Food insecurity - worry: Not on file  . Food insecurity - inability: Not on file  . Transportation needs - medical: Not on file  . Transportation needs - non-medical: Not on file  Occupational History  . Not on file  Tobacco Use  . Smoking status: Never Smoker  . Smokeless tobacco: Never Used  Substance and Sexual Activity  . Alcohol use: No  . Drug use: No  . Sexual activity: Not on file  Other Topics Concern  . Not on file  Social History Narrative  . Not on file    Allergies: Allergies  Allergen Reactions  . Penicillins Anaphylaxis    Has patient had a PCN reaction causing immediate rash, facial/tongue/throat swelling, SOB or lightheadedness with hypotension: Yes Has patient had a PCN reaction causing severe rash involving mucus membranes or skin necrosis: No Has patient had a PCN reaction that required hospitalization No Has patient had a PCN reaction occurring within the last 10 years: No If all of the above answers are "NO", then may proceed with Cephalosporin use.    Current Medications: Current Outpatient Medications  Medication Sig Dispense Refill  . amLODipine (NORVASC) 5 MG tablet Take 1 tablet (5 mg total) by mouth daily. 30 tablet 0  . aspirin EC 81 MG tablet Take 81 mg by mouth daily.     . cetirizine (ZYRTEC) 10 MG tablet Take 10 mg by mouth daily as needed for allergies.     . isosorbide mononitrate (IMDUR) 120 MG 24 hr tablet Take 120 mg by mouth daily.     . nitroGLYCERIN (NITROSTAT) 0.4 MG SL tablet Place 0.4 mg under the tongue every 5 (five) minutes x 3 doses as needed for chest pain. *if not resolved , go to emergency department.*    . RA COL-RITE 100 MG capsule take 100 mg by mouth twice a day if needed for STOOL SOFTENING  0   No current facility-administered medications for this visit.     Review of Systems   General: weight loss, weakness  HEENT:  no complaints  Lungs: cough  Cardiac: negative  GI: negative  GU: negative other than vulvar itching  Musculoskeletal: no complaints  Extremities: chronic leg swelling  Skin: negative  Neuro: negative  Endocrine: no complaints  Psych: no complaints        Objective:  Physical Examination:  There were no vitals taken for this visit.     ECOG Performance Status: 3 - Symptomatic, >50% confined to bed or chair  General appearance: appears stated age and no distress, cooperative but non interactive HEENT:PERRLA and sclera clear, anicteric  Lymph node survey: Firm fixed 1 cm left cervical lymph node which is unchanged; no obvious supraclavicular, axillary or inguinal nodes.  CV: RRR Lungs: CTA Abdomen: soft, non-tender, without  masses or organomegaly and no hernias and no ascites.  Back: inspection of back is normal Extremities: edema bilateral and symmetrical. Per patient family she has chronic edema  Skin: pigmentation changes in radiation field.  Neurological exam reveals Patient follows commands and shook my hand; she did not engage in the conversation.  Pelvic: exam chaperoned by nurse;  Vulva: normal appearing vulva with no masses, tenderness or lesions; Vagina: normal vagina; Adnexa: nontender and no masses; Uterus: unable to determine size, not grossly enlarged and nontender; Cervix: unable to visualize fully. Deviated posteriorly. No abnormalities on palpation. Parametria was smooth bilaterally; Rectal: not indicated    Lab Review N/A   Radiologic Imaging: N/A    Assessment:  MARIDEL PIXLER is a 83 y.o. female diagnosed with grade 3 endometrial cancer, non-operable candidate, s/p external beam radiation therapy. Enlarged firm cervical node on exam, stable. History of  8 mm retroperitoneal periaortic lymph node, which is within normal limits. Medical co-morbidities complicating care: Dementia, frailty, and CAD. Plan:      Problem List Items Addressed This Visit     Endometrial cancer        Right cervical adenopathy  Visit Diagnoses    Endometrial cancer    -  Primary    Dementia       I discussed the exam findings with the patient and her daughter. The cervical mass is stable so I do not think this is disease. I also did not recommend a CT scan to follow up the PA node as her mother is very frail and I do not think she could tolerate therapy if she did have a recurrence. I recommend our focus be on quality of life and symptom management, currently her mother is NED. She agreed with this plan. She will follow up and schedule appointment with Dr. Laverta Baltimore in 6 months and then return to our clinic in 12 months.    Gillis Ends, MD     CC:  Dr. Laverta [place]

## 2017-03-08 ENCOUNTER — Inpatient Hospital Stay: Payer: Medicare Other | Attending: Obstetrics and Gynecology

## 2017-04-29 ENCOUNTER — Emergency Department: Payer: Medicare Other

## 2017-04-29 ENCOUNTER — Other Ambulatory Visit: Payer: Self-pay

## 2017-04-29 ENCOUNTER — Emergency Department
Admission: EM | Admit: 2017-04-29 | Discharge: 2017-04-29 | Disposition: A | Discharge status: Home / Self Care | Payer: Medicare Other | Attending: Emergency Medicine | Admitting: Emergency Medicine

## 2017-04-29 ENCOUNTER — Encounter: Payer: Self-pay | Admitting: Emergency Medicine

## 2017-04-29 DIAGNOSIS — I2510 Atherosclerotic heart disease of native coronary artery without angina pectoris: Secondary | ICD-10-CM | POA: Insufficient documentation

## 2017-04-29 DIAGNOSIS — Z8542 Personal history of malignant neoplasm of other parts of uterus: Secondary | ICD-10-CM | POA: Insufficient documentation

## 2017-04-29 DIAGNOSIS — I10 Essential (primary) hypertension: Secondary | ICD-10-CM | POA: Insufficient documentation

## 2017-04-29 DIAGNOSIS — J45909 Unspecified asthma, uncomplicated: Secondary | ICD-10-CM | POA: Insufficient documentation

## 2017-04-29 DIAGNOSIS — Z79899 Other long term (current) drug therapy: Secondary | ICD-10-CM | POA: Insufficient documentation

## 2017-04-29 DIAGNOSIS — Z7982 Long term (current) use of aspirin: Secondary | ICD-10-CM | POA: Diagnosis not present

## 2017-04-29 DIAGNOSIS — Z955 Presence of coronary angioplasty implant and graft: Secondary | ICD-10-CM | POA: Diagnosis not present

## 2017-04-29 DIAGNOSIS — R0789 Other chest pain: Secondary | ICD-10-CM | POA: Insufficient documentation

## 2017-04-29 DIAGNOSIS — F0390 Unspecified dementia without behavioral disturbance: Secondary | ICD-10-CM | POA: Insufficient documentation

## 2017-04-29 LAB — BASIC METABOLIC PANEL
Anion gap: 7 (ref 5–15)
BUN: 17 mg/dL (ref 6–20)
CHLORIDE: 106 mmol/L (ref 101–111)
CO2: 27 mmol/L (ref 22–32)
Calcium: 9.9 mg/dL (ref 8.9–10.3)
Creatinine, Ser: 0.95 mg/dL (ref 0.44–1.00)
GFR, EST AFRICAN AMERICAN: 58 mL/min — AB (ref >60)
GFR, EST NON AFRICAN AMERICAN: 50 mL/min — AB (ref >60)
Glucose, Bld: 100 mg/dL — ABNORMAL HIGH (ref 65–99)
POTASSIUM: 4 mmol/L (ref 3.5–5.1)
SODIUM: 140 mmol/L (ref 135–145)

## 2017-04-29 LAB — CBC
HEMATOCRIT: 37.6 % (ref 35.0–47.0)
Hemoglobin: 12.1 g/dL (ref 12.0–16.0)
MCH: 30.3 pg (ref 26.0–34.0)
MCHC: 32.1 g/dL (ref 32.0–36.0)
MCV: 94.4 fL (ref 80.0–100.0)
PLATELETS: 193 10*3/uL (ref 150–440)
RBC: 3.98 MIL/uL (ref 3.80–5.20)
RDW: 15.4 % — AB (ref 11.5–14.5)
WBC: 5.4 10*3/uL (ref 3.6–11.0)

## 2017-04-29 LAB — TROPONIN I: Troponin I: <0.03 ng/mL (ref <0.03)

## 2017-04-29 MED ORDER — ALBUTEROL SULFATE (2.5 MG/3ML) 0.083% IN NEBU
1.2500 mg | INHALATION_SOLUTION | Freq: Once | RESPIRATORY_TRACT | Status: AC
  Administered 2017-04-29: 1.25 mg via RESPIRATORY_TRACT
  Filled 2017-04-29: qty 3

## 2017-04-29 NOTE — ED Triage Notes (Signed)
FIRST NURSE NOTE-here for CP. NAD. Skin color WNL. Pulled next for EKG

## 2017-04-29 NOTE — ED Triage Notes (Signed)
Pt to ED from home with daughter c/o CP that started yesterday and hasn't gotten better.  States central chest pain radiating to bilateral arms, denies SOB.  Daughter states patient was acting like she didn't feel well and wouldn't eat lunch like normal.  Pt skin warm and dry, denies n/v/d, states slight nonproductive cough.

## 2017-04-29 NOTE — ED Provider Notes (Signed)
[name]   ____________________________________________    I have reviewed the triage vital signs and the nursing notes.   HISTORY  Chief Complaint Chest Pain  Patient has a history of dementia   HPI Sara Heath is a 84 y.o. female who presents with complaints of chest pain.  Patient is a limited historian but appears to have had mild right lateral lower chest "aching "intermittently since yesterday.  Currently feels quite well and has not had any pain in hours.  No shortness of breath.  Mild dry cough.  No diaphoresis nausea or vomiting.  No calf pain, some lower semi-swelling bilaterally which is chronic.  No fevers or chills reported.   Past Medical History:  Diagnosis Date  . Angioedema   . Asthma   . Breast mass, left   . CAD (coronary artery disease)   . Chronic back pain   . Chronic headache   . Dementia   . Dementia   . Edema of both legs   . Endometrial cancer ([place])   . Gout   . Hematuria   . Hemorrhagic cystitis   . Hypertension   . Malaise and fatigue   . Sleep apnea   . Torticollis   . UTI (lower urinary tract infection)   . Vaginal candidiasis   . Vulvar dystrophy   . Weight loss     Patient Active Problem List   Diagnosis Date Noted  . Chest pain 02/08/2017  . Malnutrition of moderate degree 09/08/2015  . Pancytopenia ([place]) 09/08/2015  . Acute cystitis with hematuria 09/08/2015  . General weakness 09/08/2015  . Dementia 09/08/2015  . Syncope 09/06/2015  . CAD (coronary artery disease) 09/06/2015  . HTN, goal below 140/80 09/06/2015  . History of endometrial cancer 09/06/2015    Past Surgical History:  Procedure Laterality Date  . ABDOMINAL HYSTERECTOMY    . CORONARY ANGIOPLASTY WITH STENT PLACEMENT    . left breast biopsy    . tubal ligation      Prior to Admission medications   Medication Sig Start Date End Date Taking? Authorizing Provider  amLODipine (NORVASC)  5 MG tablet Take 1 tablet (5 mg total) by mouth daily. 05/19/16   Fritzi Mandes, MD  aspirin EC 81 MG tablet Take 81 mg by mouth daily.     [provider]  cetirizine (ZYRTEC) 10 MG tablet Take 10 mg by mouth daily as needed for allergies.  11/26/14   [provider]  isosorbide mononitrate (IMDUR) 120 MG 24 hr tablet Take 120 mg by mouth daily.  11/13/14   [provider]  nitroGLYCERIN (NITROSTAT) 0.4 MG SL tablet Place 0.4 mg under the tongue every 5 (five) minutes x 3 doses as needed for chest pain. *if not resolved , go to emergency department.*    [provider]  RA COL-RITE 100 MG capsule take 100 mg by mouth twice a day if needed for STOOL SOFTENING 10/01/14   [provider]     Allergies Penicillins  Family History  Family history unknown: Yes    Social History Social History   Tobacco Use  . Smoking status: Never Smoker  . Smokeless tobacco: Never Used  Substance Use Topics  . Alcohol use: No  . Drug use: No    Review of Systems  Constitutional: No dizziness Eyes: No visual changes.  ENT: No sore throat. Cardiovascular: As above Respiratory: Denies shortness of breath. Gastrointestinal:  No nausea, no  vomiting.   Genitourinary: Negative for dysuria. Musculoskeletal: Negative for back pain. Skin: Negative for rash. Neurological: Negative for headaches   ____________________________________________   PHYSICAL EXAM:  VITAL SIGNS: ED Triage Vitals  Enc Vitals Group     BP 04/29/17 1822 133/81     Pulse Rate 04/29/17 1822 81     Resp 04/29/17 1822 18     Temp 04/29/17 1822 98.2 F (36.8 C)     Temp Source 04/29/17 1822 Oral     SpO2 04/29/17 1822 95 %     Weight 04/29/17 1627 59 kg (130 lb)     Height --      Head Circumference --      Peak Flow --      Pain Score 04/29/17 1627 6     Pain Loc --      Pain Edu? --      Excl. in [place]? --     Constitutional: Alert No acute distress. Eyes: Conjunctivae are  normal.   Nose: No congestion/rhinnorhea.  Cardiovascular: Normal rate, regular rhythm. Grossly normal heart sounds.  Good peripheral circulation. Respiratory: Normal respiratory effort.  No retractions. Lungs CTAB. Gastrointestinal: Soft and nontender. No distention.  Genitourinary: deferred Musculoskeletal: Bilateral lower extremity edema. warm and well perfused Neurologic:  Normal speech and language. No gross focal neurologic deficits are appreciated.  Skin:  Skin is warm, dry and intact. No rash noted. Psychiatric: Mood and affect are normal. Speech and behavior are normal.  ____________________________________________   LABS (all labs ordered are listed, but only abnormal results are displayed)  Labs Reviewed  BASIC METABOLIC PANEL - Abnormal; Notable for the following components:      Result Value   Glucose, Bld 100 (*)    GFR calc non Af Amer 50 (*)    GFR calc Af Amer 58 (*)    All other components within normal limits  CBC - Abnormal; Notable for the following components:   RDW 15.4 (*)    All other components within normal limits  TROPONIN I   ____________________________________________  EKG  ED ECG REPORT I, Lavonia Drafts, the attending physician, personally viewed and interpreted this ECG.  Date: 04/29/2017  Rhythm: normal sinus rhythm QRS Axis: normal Intervals: normal ST/T Wave abnormalities: normal Narrative Interpretation: no evidence of acute ischemia  ____________________________________________  RADIOLOGY  Chest x-ray unremarkable, mild COPD ____________________________________________   PROCEDURES  Procedure(s) performed: No  Procedures   Critical Care performed: No ____________________________________________   INITIAL IMPRESSION / ASSESSMENT AND PLAN / ED COURSE  Pertinent labs & imaging results that were available during my care of the patient were reviewed by me and considered in my medical decision making (see chart for  details).  Patient well-appearing in no acute distress.  She is asymptomatic.  Troponin is normal.  EKG is unremarkable.  Exam is benign chest x-ray is negative for pneumonia  Symptoms not consistent with ACS however did emphasize the need for outpatient workup with cardiology and strict return precautions    ____________________________________________   FINAL CLINICAL IMPRESSION(S) / ED DIAGNOSES  Final diagnoses:  Atypical chest pain        Note:  This document was prepared using Dragon voice recognition software and may include unintentional dictation errors.    Lavonia Drafts, MD 04/29/17 2109

## 2017-05-01 ENCOUNTER — Telehealth: Payer: Self-pay

## 2017-05-01 NOTE — Telephone Encounter (Signed)
Lmov for patient to call back Seen in ED on 04/29/17 Was seen for CP  Will try again at a later time

## 2017-05-13 NOTE — Progress Notes (Signed)
Cardiology Office Note  Date:  05/16/2017   ID:  Sara Heath, DOB 1921-09-16, MRN [id number]  PCP:  Petra Kuba, MD   Chief Complaint  Patient presents with  . New Patient (Initial Visit)    ED follow up for chest pain. Patient c/o chest pain, SOB, and swelling in legs. Meds reviewed verbally with patient.     HPI:  84 yo woman with PMH of Dementia HTN LE edema CAD, previous stent maybe 2002 at [facility]  Chest pain In the ER 04/29/2017, 01/2017, 07/2016 She presents by referral from the emergency room for evaluation of chest pain symptoms  Seen in the emergency room April 29, 2017 for atypical chest pain Work up in ER negative, EKG unchanged from 2015 Troponin negative x2 Hospital records reviewed with the patient in detail Symptoms presented at rest  Similar symptoms back in October 2018, April 2018 Workup at those times again negative  Carotid: 05/18/2017 Less than 50% stenosis in the right and left internal carotid Arteries.  Echo 04/2016 Left ventricle: The cavity size was normal. Systolic function was   normal. The estimated ejection fraction was in the range of 55% to 60%. - Aortic valve: Bicuspid; mildly thickened, mildly calcified leaflets. There was mild regurgitation. Valve area (VTI): 2.32 cm^2. Valve area (Vmax): 2.2 cm^2. Valve area (Vmean): 2.69 cm^2. - Mitral valve: There was mild regurgitation.  Stress test 05/2013, performed by [facility]?  EKG personally reviewed by myself on todays visit Shows normal sinus rhythm rate 71 bpm poor R wave progression to the anterior precordial leads, unable to exclude old anterior MI, consider old inferior MI   PMH:   has a past medical history of Angioedema, Asthma, Breast mass, left, CAD (coronary artery disease), Chronic back pain, Chronic headache, Dementia, Dementia, Edema of both legs, Endometrial cancer ([place]), Gout, Hematuria, Hemorrhagic cystitis, Hypertension, Malaise and fatigue, Sleep apnea, Torticollis, UTI  (lower urinary tract infection), Vaginal candidiasis, Vulvar dystrophy, and Weight loss.  PSH:    Past Surgical History:  Procedure Laterality Date  . ABDOMINAL HYSTERECTOMY    . CORONARY ANGIOPLASTY WITH STENT PLACEMENT    . left breast biopsy    . tubal ligation      Current Outpatient Medications  Medication Sig Dispense Refill  . amLODipine (NORVASC) 5 MG tablet Take 1 tablet (5 mg total) by mouth daily. 30 tablet 0  . aspirin EC 81 MG tablet Take 81 mg by mouth daily.     . cetirizine (ZYRTEC) 10 MG tablet Take 10 mg by mouth daily as needed for allergies.     . isosorbide mononitrate (IMDUR) 120 MG 24 hr tablet Take 120 mg by mouth daily.     . nitroGLYCERIN (NITROSTAT) 0.4 MG SL tablet Place 0.4 mg under the tongue every 5 (five) minutes x 3 doses as needed for chest pain. *if not resolved , go to emergency department.*    . RA COL-RITE 100 MG capsule take 100 mg by mouth twice a day if needed for STOOL SOFTENING  0   No current facility-administered medications for this visit.      Allergies:   Penicillins   Social History:  The patient  reports that  has never smoked. she has never used smokeless tobacco. She reports that she does not drink alcohol or use drugs.   Family History:   Family history is unknown by patient.    Review of Systems: Review of Systems  Constitutional: Negative.   Respiratory: Negative.  Cardiovascular: Positive for chest pain.  Gastrointestinal: Negative.   Musculoskeletal: Negative.   Neurological: Negative.   Psychiatric/Behavioral: Negative.   All other systems reviewed and are negative.    PHYSICAL EXAM: VS:  BP 130/70 (BP Location: Left Arm, Patient Position: Sitting, Cuff Size: Normal)   Pulse 70   Ht 5\' 3"  (1.6 m)   Wt 118 lb 4 oz (53.6 kg)   BMI 20.95 kg/m  , BMI Body mass index is 20.95 kg/m. GEN: in no acute distress , thin, frail, presenting a wheelchair HEENT: normal  Neck: no JVD, carotid bruits, or masses Cardiac:  RRR; no murmurs, rubs, or gallops,no edema  Respiratory:  clear to auscultation bilaterally, normal work of breathing GI: soft, nontender, nondistended, + BS MS: no deformity or atrophy  Skin: warm and dry, no rash Neuro:  Strength and sensation are intact Psych: euthymic mood, full affect    Recent Labs: 05/17/2016: B Natriuretic Peptide 40.0 04/29/2017: BUN 17; Creatinine, Ser 0.95; Hemoglobin 12.1; Platelets 193; Potassium 4.0; Sodium 140    Lipid Panel Lab Results  Component Value Date   CHOL 134 09/03/2011   HDL 49 09/03/2011   LDLCALC 73 09/03/2011   TRIG 59 09/03/2011      Wt Readings from Last 3 Encounters:  05/16/17 118 lb 4 oz (53.6 kg)  04/29/17 130 lb (59 kg)  02/08/17 120 lb (54.4 kg)       ASSESSMENT AND PLAN:  Coronary artery disease of native artery of native heart with stable angina pectoris ([place]) - Plan: EKG 12-Lead No documentation of coronary disease apart from history of previous stent many years ago per the daughter Notes from outside clinic reporting previous stress test 2015 Atypical chest pain symptoms presenting at rest Several trips to the emergency room April in October 2018, And January 2019 Given age, atypical symptoms, no change in EKG,  No further testing at this time Recommended retry GERD therapy, carbonated soda for belching, nitroglycerin or angina. If there is a clear escalation in symptoms, could pursue testing, otherwise conservative therapy   HTN, goal below 140/80 Blood pressure is well controlled on today's visit. No changes made to the medications.  General weakness - Plan: EKG 12-Lead  presenting a wheelchair,  Does not walk very much, no recent falls   Dementia without behavioral disturbance, unspecified dementia type  conversant today, quiet  Behavior appropriate   Malnutrition of moderate degree  recommended she try to increase her calorie intake  Daughter takes care of her at home   Total encounter time more than  45 minutes  Greater than 50% was spent in counseling and coordination of care with the patient   Disposition:   F/U  as needed    Orders Placed This Encounter  Procedures  . EKG 12-Lead     Signed, Esmond Plants, M.D., Ph.D. 05/16/2017  [place], [place]

## 2017-05-16 ENCOUNTER — Ambulatory Visit (INDEPENDENT_AMBULATORY_CARE_PROVIDER_SITE_OTHER): Payer: Medicare Other | Admitting: Cardiovascular Disease

## 2017-05-16 ENCOUNTER — Encounter: Payer: Self-pay | Admitting: Cardiovascular Disease

## 2017-05-16 VITALS — BP 130/70 | HR 70 | Ht 63.0 in | Wt 118.2 lb

## 2017-05-16 DIAGNOSIS — R531 Weakness: Secondary | ICD-10-CM | POA: Diagnosis not present

## 2017-05-16 DIAGNOSIS — I25118 Atherosclerotic heart disease of native coronary artery with other forms of angina pectoris: Secondary | ICD-10-CM

## 2017-05-16 DIAGNOSIS — I10 Essential (primary) hypertension: Secondary | ICD-10-CM

## 2017-05-16 DIAGNOSIS — F0390 Unspecified dementia without behavioral disturbance: Secondary | ICD-10-CM | POA: Diagnosis not present

## 2017-05-16 DIAGNOSIS — E440 Moderate protein-calorie malnutrition: Secondary | ICD-10-CM

## 2017-05-16 NOTE — Patient Instructions (Signed)
Medication Instructions:   No medication changes made  Labwork:  No new labs needed  Testing/Procedures:  No further testing at this time   Follow-Up: It was a pleasure seeing you in the office today. Please call us if you have new issues that need to be addressed before your next appt.  [phone]  Your physician wants you to follow-up in:  As needed  If you need a refill on your cardiac medications before your next appointment, please call your pharmacy.     

## 2017-05-19 IMAGING — DX DG CHEST 1V
1 series · 1 of 1 positions shown · non-contrast
Comparison: 12/22/2014

CLINICAL DATA: Recent cardiac arrest

EXAM:
CHEST 1 VIEW

[chest ap]
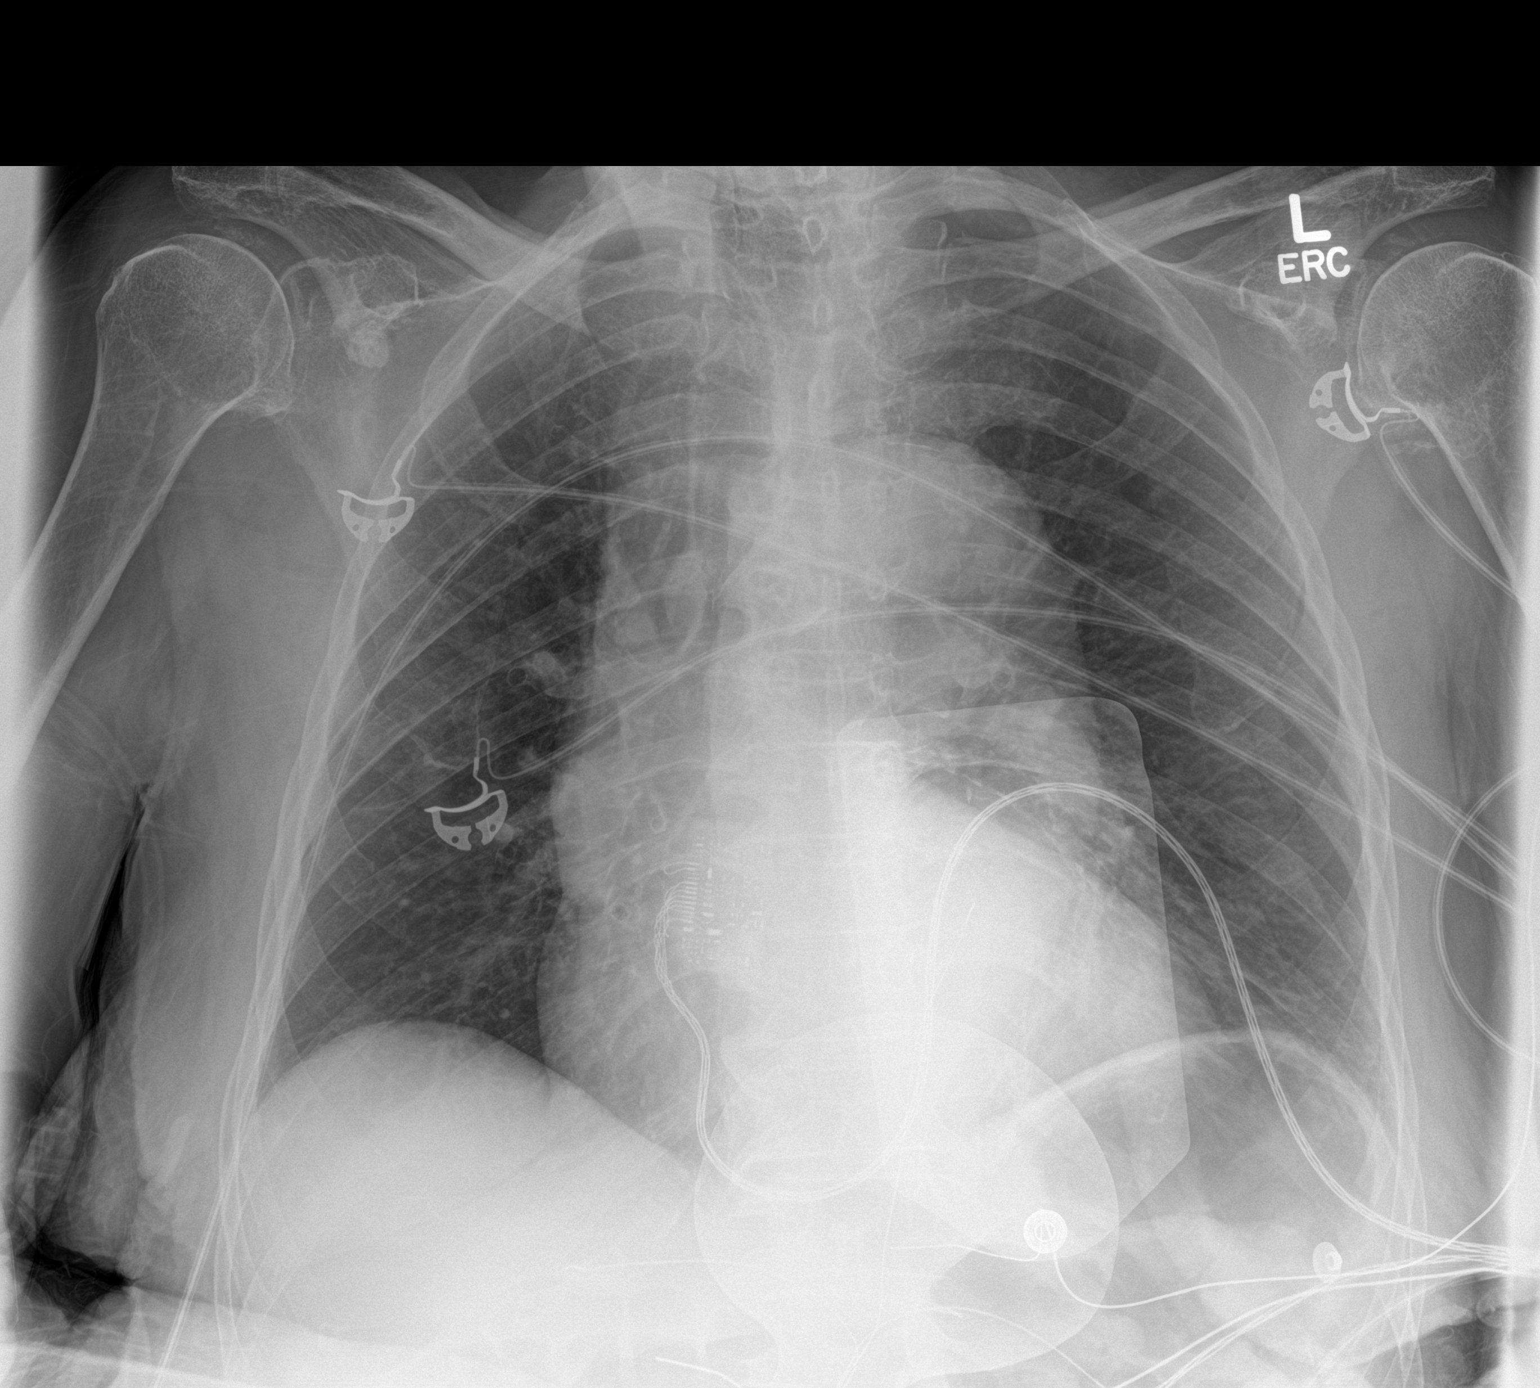

[1 of 1 positions shown; findings below may reference images not displayed]

FINDINGS: Cardiac shadow is stable. The lungs are well aerated bilaterally. No
pneumothorax is seen. No acute bony abnormalities noted.
IMPRESSION: Stable appearance of the chest from the previous exam.

## 2017-05-19 IMAGING — CT CT HEAD W/O CM
1 series · 16 of 28 positions shown, 20 images · non-contrast
Comparison: 10/02/2014

CLINICAL DATA: Recent syncopal episode

EXAM:
CT HEAD WITHOUT CONTRAST
TECHNIQUE: Contiguous axial images were obtained from the base of the skull
through the vertex without intravenous contrast.

[Series 2: head wo · axial · 0.47mm/px · z∈[-187,-62]mm · 16 of 28 slices shown, 20 images]
[im 2/28  brain]
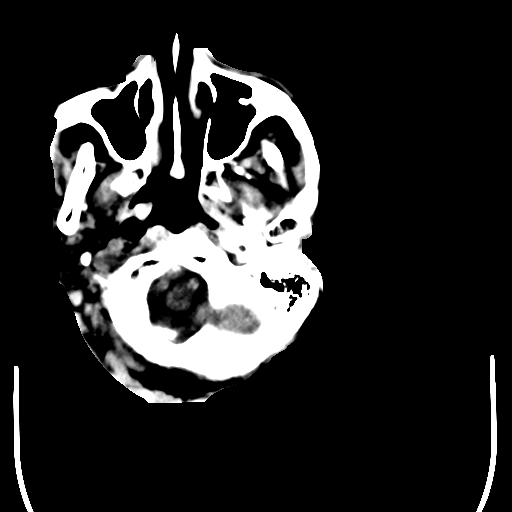
[im 2/28  bone]
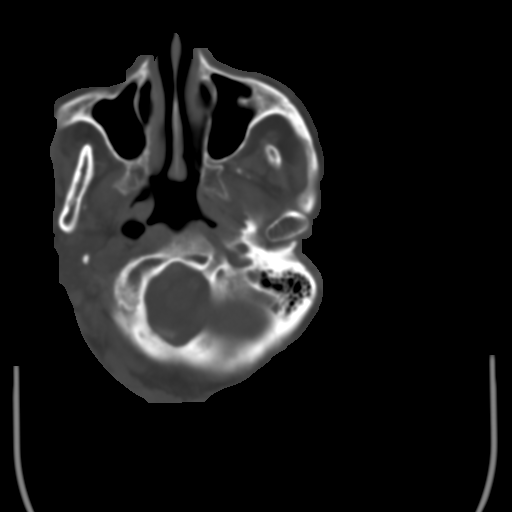
[im 4/28  brain]
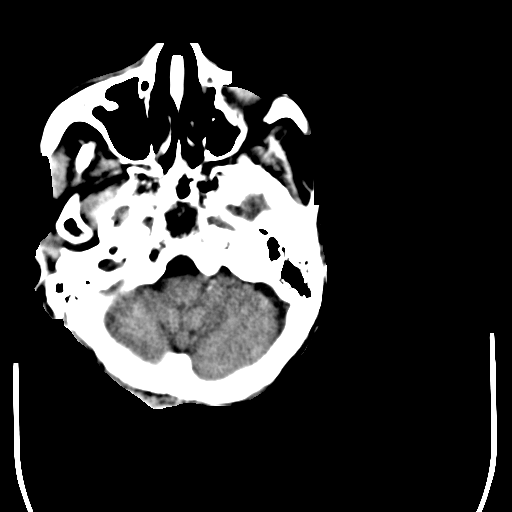
[im 6/28  brain]
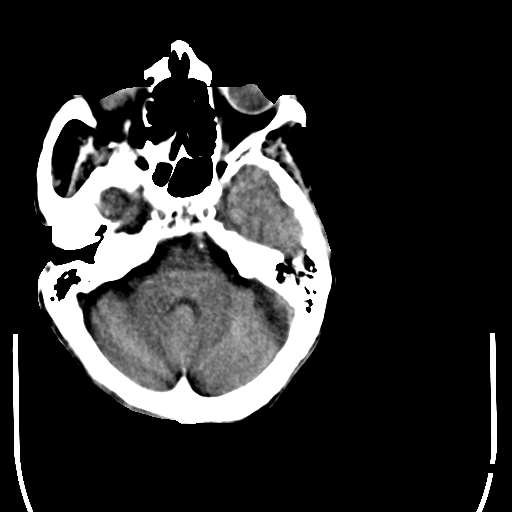
[im 7/28  brain]
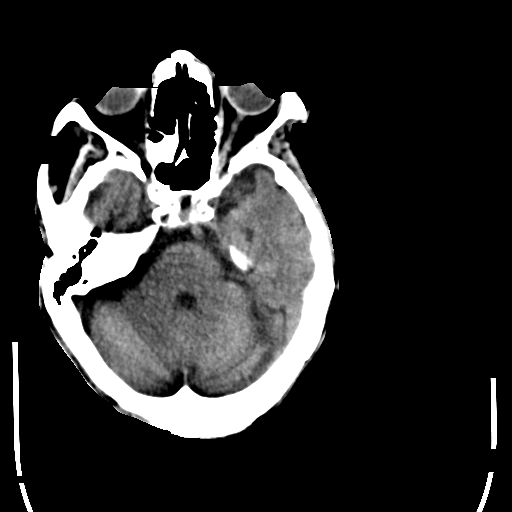
[im 9/28  brain]
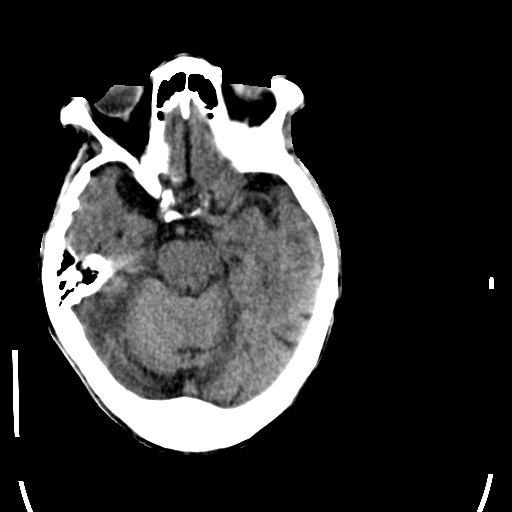
[im 9/28  bone]
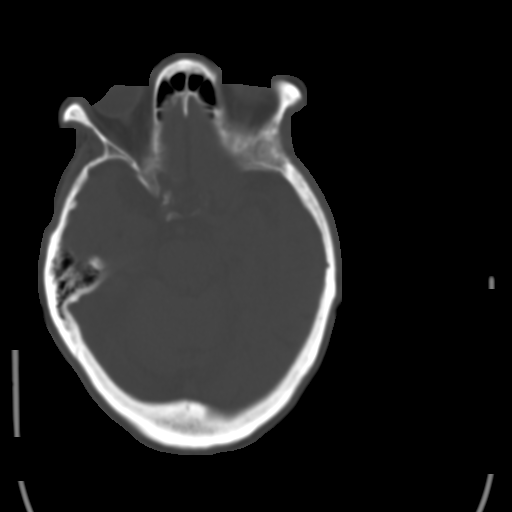
[im 10/28  brain]
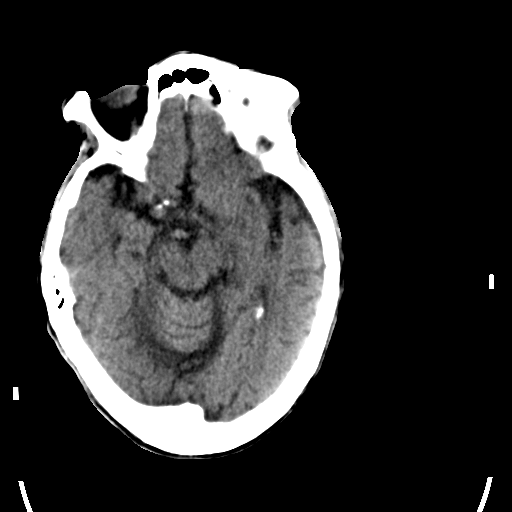
[im 12/28  brain]
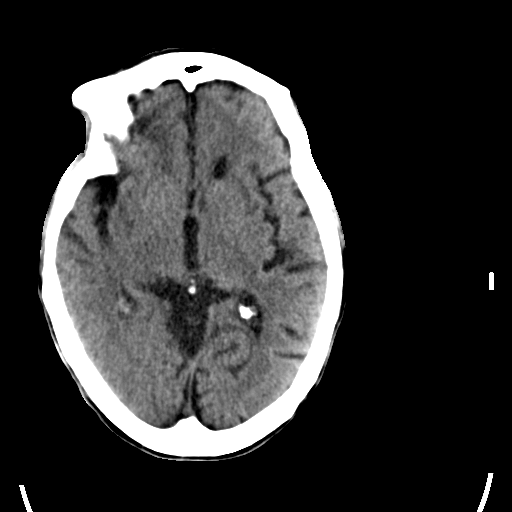
[im 14/28  brain]
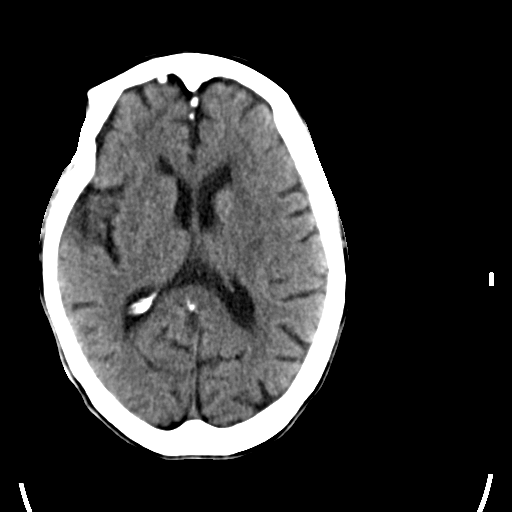
[im 15/28  brain]
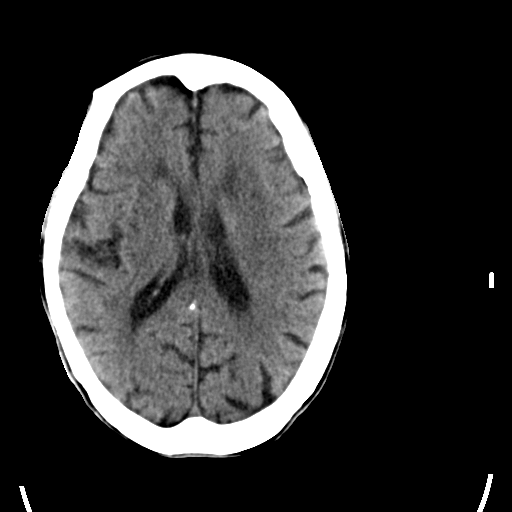
[im 15/28  bone]
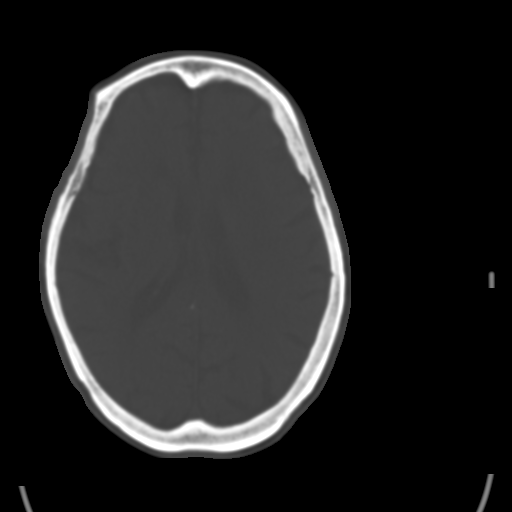
[im 17/28  brain]
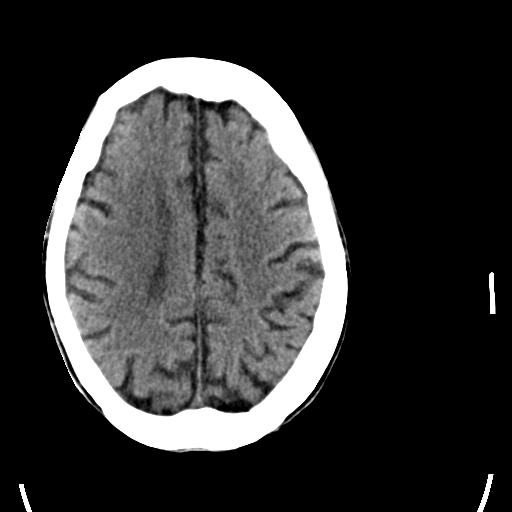
[im 19/28  brain]
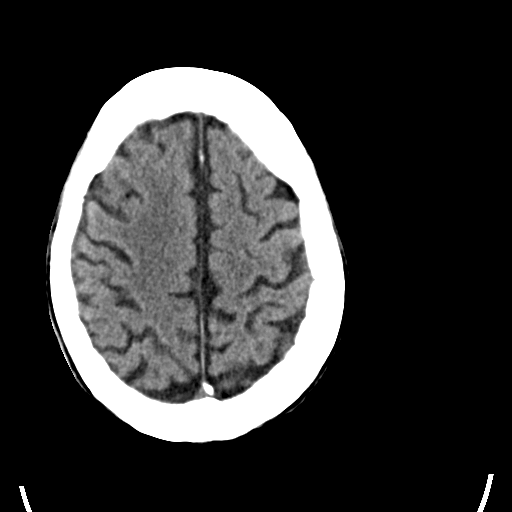
[im 20/28  brain]
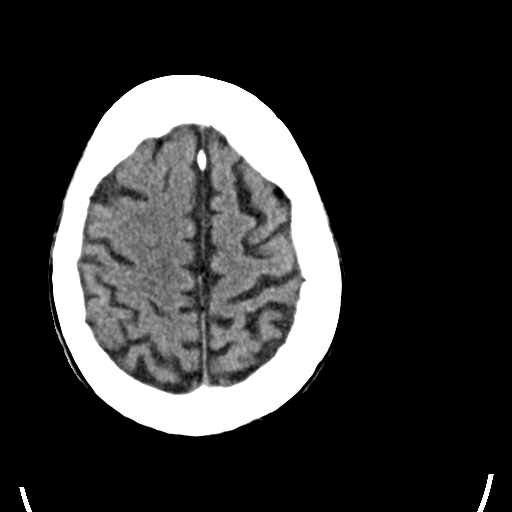
[im 22/28  brain]
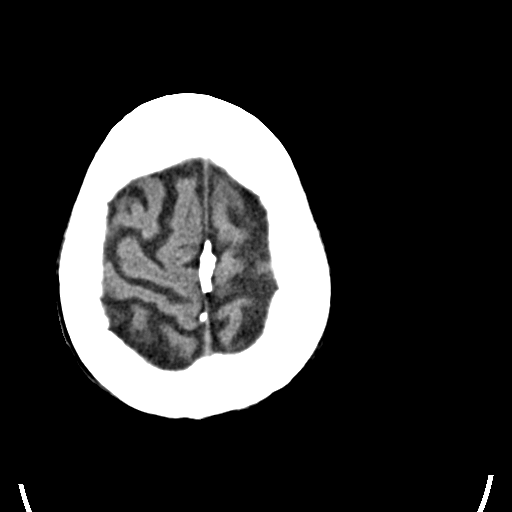
[im 22/28  bone]
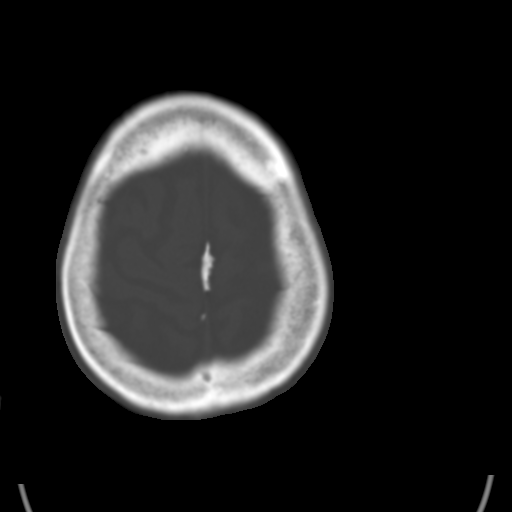
[im 23/28  brain]
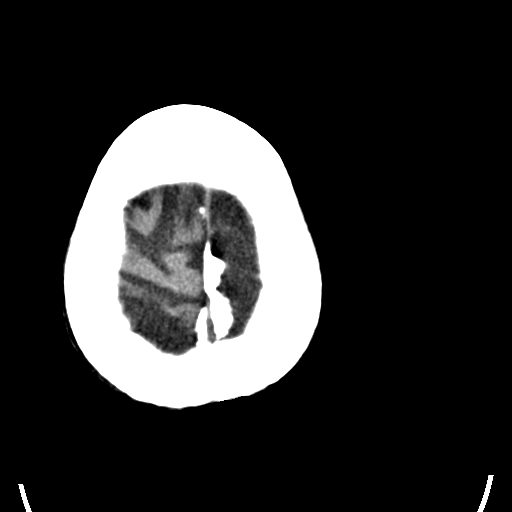
[im 25/28  brain]
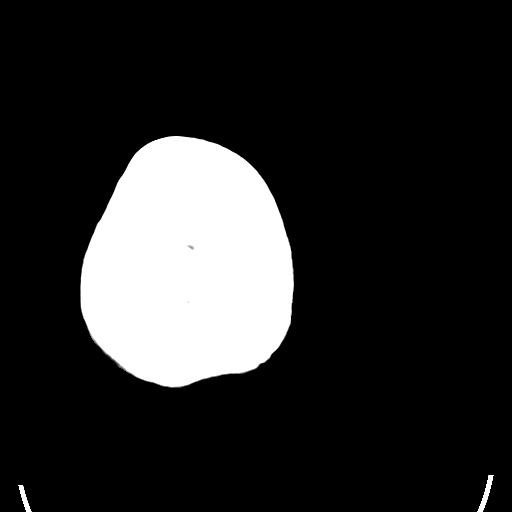
[im 27/28  brain]
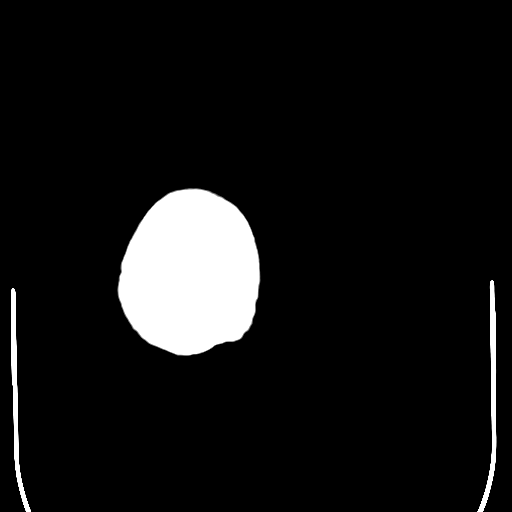

[16 of 28 positions shown; findings below may reference images not displayed]

FINDINGS: Bony calvarium is intact. Diffuse atrophic changes are again
identified. No findings to suggest acute hemorrhage, acute
infarction or space-occupying mass lesion are noted. Some scattered
areas decreased attenuation are noted in the deep white matter
consistent with chronic white matter ischemic change.
IMPRESSION: Atrophy and chronic ischemic change.  No acute abnormality is noted.

## 2017-07-30 ENCOUNTER — Emergency Department
Admission: EM | Admit: 2017-07-30 | Discharge: 2017-07-30 | Disposition: A | Discharge status: Home / Self Care | Payer: Medicare Other | Attending: Emergency Medicine | Admitting: Emergency Medicine

## 2017-07-30 ENCOUNTER — Emergency Department: Payer: Medicare Other

## 2017-07-30 ENCOUNTER — Encounter: Payer: Self-pay | Admitting: Medical Oncology

## 2017-07-30 DIAGNOSIS — Z79899 Other long term (current) drug therapy: Secondary | ICD-10-CM | POA: Diagnosis not present

## 2017-07-30 DIAGNOSIS — C799 Secondary malignant neoplasm of unspecified site: Secondary | ICD-10-CM

## 2017-07-30 DIAGNOSIS — R05 Cough: Secondary | ICD-10-CM | POA: Diagnosis not present

## 2017-07-30 DIAGNOSIS — C7802 Secondary malignant neoplasm of left lung: Secondary | ICD-10-CM | POA: Diagnosis not present

## 2017-07-30 DIAGNOSIS — I2510 Atherosclerotic heart disease of native coronary artery without angina pectoris: Secondary | ICD-10-CM | POA: Diagnosis not present

## 2017-07-30 DIAGNOSIS — F0390 Unspecified dementia without behavioral disturbance: Secondary | ICD-10-CM | POA: Diagnosis not present

## 2017-07-30 DIAGNOSIS — Z7982 Long term (current) use of aspirin: Secondary | ICD-10-CM | POA: Insufficient documentation

## 2017-07-30 DIAGNOSIS — J45909 Unspecified asthma, uncomplicated: Secondary | ICD-10-CM | POA: Diagnosis not present

## 2017-07-30 DIAGNOSIS — M25551 Pain in right hip: Secondary | ICD-10-CM | POA: Diagnosis not present

## 2017-07-30 DIAGNOSIS — I10 Essential (primary) hypertension: Secondary | ICD-10-CM | POA: Insufficient documentation

## 2017-07-30 DIAGNOSIS — Z8542 Personal history of malignant neoplasm of other parts of uterus: Secondary | ICD-10-CM | POA: Diagnosis not present

## 2017-07-30 DIAGNOSIS — R079 Chest pain, unspecified: Secondary | ICD-10-CM | POA: Diagnosis present

## 2017-07-30 LAB — CBC
HCT: 34.5 % — ABNORMAL LOW (ref 35.0–47.0)
HEMOGLOBIN: 11.2 g/dL — AB (ref 12.0–16.0)
MCH: 29.1 pg (ref 26.0–34.0)
MCHC: 32.4 g/dL (ref 32.0–36.0)
MCV: 89.8 fL (ref 80.0–100.0)
PLATELETS: 324 10*3/uL (ref 150–440)
RBC: 3.84 MIL/uL (ref 3.80–5.20)
RDW: 16.5 % — ABNORMAL HIGH (ref 11.5–14.5)
WBC: 5.2 10*3/uL (ref 3.6–11.0)

## 2017-07-30 LAB — BASIC METABOLIC PANEL
ANION GAP: 7 (ref 5–15)
BUN: 15 mg/dL (ref 6–20)
CHLORIDE: 102 mmol/L (ref 101–111)
CO2: 25 mmol/L (ref 22–32)
Calcium: 9.6 mg/dL (ref 8.9–10.3)
Creatinine, Ser: 0.63 mg/dL (ref 0.44–1.00)
GFR calc Af Amer: >60 mL/min (ref >60)
GLUCOSE: 103 mg/dL — AB (ref 65–99)
Potassium: 3.6 mmol/L (ref 3.5–5.1)
Sodium: 134 mmol/L — ABNORMAL LOW (ref 135–145)

## 2017-07-30 LAB — HEPATIC FUNCTION PANEL
ALBUMIN: 3 g/dL — AB (ref 3.5–5.0)
ALT: 11 U/L — AB (ref 14–54)
AST: 23 U/L (ref 15–41)
Alkaline Phosphatase: 44 U/L (ref 38–126)
BILIRUBIN TOTAL: 0.6 mg/dL (ref 0.3–1.2)
Bilirubin, Direct: 0.1 mg/dL (ref 0.1–0.5)
Indirect Bilirubin: 0.5 mg/dL (ref 0.3–0.9)
Total Protein: 7.1 g/dL (ref 6.5–8.1)

## 2017-07-30 LAB — PROTIME-INR
INR: 1.09
Prothrombin Time: 14 seconds (ref 11.4–15.2)

## 2017-07-30 LAB — APTT: APTT: 33 s (ref 24–36)

## 2017-07-30 LAB — TROPONIN I

## 2017-07-30 MED ORDER — IOHEXOL 350 MG/ML SOLN
75.0000 mL | Freq: Once | INTRAVENOUS | Status: AC | PRN
  Administered 2017-07-30: 75 mL via INTRAVENOUS

## 2017-07-30 MED ORDER — OXYCODONE HCL 5 MG PO TABS: 2.5000 mg | ORAL_TABLET | Freq: Four times a day (QID) | ORAL | 0 refills | Status: DC | PRN

## 2017-07-30 NOTE — ED Notes (Signed)
CT called and notified Kat of new IV established and pt ready for scan

## 2017-07-30 NOTE — ED Triage Notes (Signed)
Pt reports today she began having rt sided rib/chest pain. Reports pain with movement and breathing. Pt very vague about pain. Pt told her daughter that she was hurting in her flank.

## 2017-07-30 NOTE — ED Notes (Signed)
Esign not working pt verbalized discharge instructions and has no questions at this time 

## 2017-07-30 NOTE — Discharge Instructions (Addendum)
Pain control: Take tylenol 1000mg every 8 hours. Take 2.5mg of oxycodone every 6 hours for breakthrough pain. If you need the oxycodone make sure to take one senokot as well to prevent constipation. ° °Do not drink alcohol, drive or participate in any other potentially dangerous activities while taking this medication as it may make you sleepy. Do not take this medication with any other sedating medications, either prescription or over-the-counter. ° °

## 2017-07-30 NOTE — ED Provider Notes (Signed)
[facility] Emergency Department Provider Note  ____________________________________________  Time seen: Approximately 5:16 PM  I have reviewed the triage vital signs and the nursing notes.   HISTORY  Chief Complaint Chest Pain  Level 5 caveat:  Portions of the history and physical were unable to be obtained due to dementia   HPI Sara Heath is a 84 y.o. female a history of advanced dementia, remote endometrial cancer, hypertension, CAD who presents for evaluation of right-sided chest pain.  History is gathered mostly from patient's daughter.  She reports that patient was complaining of right hip pain earlier today which is the reason why she brought her to the emergency room.  Since arriving to the emergency room patient started complaining of R sided chest pain, sharp worse when she take s a deep breath. Patient denies CP during my eval but when I asked her to take a deep breath she points to the R lower chest area as the site of pain.  According to the daughter, patient has had a mild cough for the last few days.  No fever or chills, no vomiting or diarrhea. No recent falls.   Past Medical History:  Diagnosis Date  . Angioedema   . Asthma   . Breast mass, left   . CAD (coronary artery disease)   . Chronic back pain   . Chronic headache   . Dementia   . Dementia   . Edema of both legs   . Endometrial cancer ([place])   . Gout   . Hematuria   . Hemorrhagic cystitis   . Hypertension   . Malaise and fatigue   . Sleep apnea   . Torticollis   . UTI (lower urinary tract infection)   . Vaginal candidiasis   . Vulvar dystrophy   . Weight loss     Patient Active Problem List   Diagnosis Date Noted  . Chest pain 02/08/2017  . Malnutrition of moderate degree 09/08/2015  . Pancytopenia ([place]) 09/08/2015  . Acute cystitis with hematuria 09/08/2015  . General weakness 09/08/2015  . Dementia 09/08/2015  . Syncope 09/06/2015  . CAD (coronary artery  disease) 09/06/2015  . HTN, goal below 140/80 09/06/2015  . History of endometrial cancer 09/06/2015    Past Surgical History:  Procedure Laterality Date  . ABDOMINAL HYSTERECTOMY    . CORONARY ANGIOPLASTY WITH STENT PLACEMENT    . left breast biopsy    . tubal ligation      Prior to Admission medications   Medication Sig Start Date End Date Taking? Authorizing Provider  amLODipine (NORVASC) 5 MG tablet Take 1 tablet (5 mg total) by mouth daily. 05/19/16   Fritzi Mandes, MD  aspirin EC 81 MG tablet Take 81 mg by mouth daily.     [provider]  cetirizine (ZYRTEC) 10 MG tablet Take 10 mg by mouth daily as needed for allergies.  11/26/14   [provider]  isosorbide mononitrate (IMDUR) 120 MG 24 hr tablet Take 120 mg by mouth daily.  11/13/14   [provider]  nitroGLYCERIN (NITROSTAT) 0.4 MG SL tablet Place 0.4 mg under the tongue every 5 (five) minutes x 3 doses as needed for chest pain. *if not resolved , go to emergency department.*    [provider]  oxyCODONE (ROXICODONE) 5 MG immediate release tablet Take 0.5 tablets (2.5 mg total) by mouth every 6 (six) hours as needed for moderate pain or severe pain. 07/30/17 07/30/18  Rudene Re, MD  RA COL-RITE 100 MG capsule take 100 mg by mouth twice a day if needed for STOOL SOFTENING 10/01/14   [provider]    Allergies Penicillins  Family History  Family history unknown: Yes    Social History Social History   Tobacco Use  . Smoking status: Never Smoker  . Smokeless tobacco: Never Used  Substance Use Topics  . Alcohol use: No  . Drug use: No    Review of Systems  Constitutional: Negative for fever. Cardiovascular: + R chest pain, cough Respiratory: Negative for shortness of breath. Gastrointestinal: Negative for abdominal pain, vomiting or diarrhea. Musculoskeletal: Negative for back pain. Skin: Negative for rash. Neurological: Negative for headaches, weakness or  numbness.  Level 5 caveat:  Portions of the history and physical were unable to be obtained due to dementia  ____________________________________________   PHYSICAL EXAM:  VITAL SIGNS: ED Triage Vitals  Enc Vitals Group     BP 07/30/17 1605 126/80     Pulse Rate 07/30/17 1605 86     Resp 07/30/17 1605 20     Temp 07/30/17 1605 97.8 F (36.6 C)     Temp Source 07/30/17 1605 Oral     SpO2 07/30/17 1605 97 %     Weight 07/30/17 1606 118 lb (53.5 kg)     Height --      Head Circumference --      Peak Flow --      Pain Score --      Pain Loc --      Pain Edu? --      Excl. in [place]? --     Constitutional: Alert and oriented x2 , no distress.  HEENT:      Head: Normocephalic and atraumatic.         Eyes: Conjunctivae are normal. Sclera is non-icteric.       Mouth/Throat: Mucous membranes are moist.       Neck: Supple with no signs of meningismus. Cardiovascular: Regular rate and rhythm. No murmurs, gallops, or rubs. 2+ symmetrical distal pulses are present in all extremities. No JVD. Respiratory: Normal respiratory effort. Lungs are clear to auscultation bilaterally with fiant crackles on the R base. Gastrointestinal: Soft, non tender, fully reducible umbilical hernia,  and non distended with positive bowel sounds. No rebound or guarding. Musculoskeletal: Full painless ROM of b/l hips. Nontender with normal range of motion in all extremities. No edema, cyanosis, or erythema of extremities. Neurologic: Normal speech and language. Face is symmetric. Moving all extremities. No gross focal neurologic deficits are appreciated. Skin: Skin is warm, dry and intact. No rash noted. Psychiatric: Mood and affect are normal. Speech and behavior are normal.  ____________________________________________   LABS (all labs ordered are listed, but only abnormal results are displayed)  Labs Reviewed  CBC - Abnormal; Notable for the following components:      Result Value   Hemoglobin 11.2 (*)      HCT 34.5 (*)    RDW 16.5 (*)    All other components within normal limits  BASIC METABOLIC PANEL - Abnormal; Notable for the following components:   Sodium 134 (*)    Glucose, Bld 103 (*)    All other components within normal limits  HEPATIC FUNCTION PANEL - Abnormal; Notable for the following components:   Albumin 3.0 (*)    ALT 11 (*)    All other components within normal limits  TROPONIN I  PROTIME-INR  APTT   ____________________________________________  EKG  ED ECG  REPORT I, Rudene Re, the attending physician, personally viewed and interpreted this ECG.  Normal sinus rhythm, rate of 86, normal intervals, left axis deviation, Q waves in inferior and lateral leads, no ST elevations or depressions.  Unchanged from prior from 04/2017 ____________________________________________  RADIOLOGY  I have personally reviewed the images performed during this visit and I agree with the Radiologist's read.   Interpretation by Radiologist:  Dg Chest 2 View  Result Date: 07/30/2017 CLINICAL DATA:  Patient poor historian at this time. Reports to this tech, pain across the front of her upper chest. Patient reports to triage RN: Pt reports today she began having rt sided rib/chest pain. Reports pain with movement and breathing. Pt very vague about pain. Pt told her daughter that she was hurting in her flank. hx HTN, left breast mass, asthma, coronary stent placement. Non-smoker EXAM: CHEST - 2 VIEW COMPARISON:  04/29/2017 FINDINGS: Cardiac silhouette is borderline enlarged. Aorta is enlarged. No mediastinal or hilar masses. There is opacity at the right lung base silhouetting the right hemidiaphragm. There is small areas of hazy opacity in the mid lungs. No pleural effusion.  No pneumothorax. Skeletal structures are demineralized but grossly intact. IMPRESSION: 1. Right lung base opacity consistent with pneumonia. Smaller subtle areas hazy opacity in the perihilar regions may reflect  additional areas of infection. No evidence of pulmonary edema. Electronically Signed   By: Lajean Manes M.D.   On: 07/30/2017 16:39   Ct Angio Chest Pe W And/or Wo Contrast  Result Date: 07/30/2017 CLINICAL DATA:  Pt reports today she began having rt sided rib/chest pain. Reports pain with movement and breathing. Pt very vague about pain. Pt told her daughter that she was hurting in her flank EXAM: CT ANGIOGRAPHY CHEST WITH CONTRAST TECHNIQUE: Multidetector CT imaging of the chest was performed using the standard protocol during bolus administration of intravenous contrast. Multiplanar CT image reconstructions and MIPs were obtained to evaluate the vascular anatomy. CONTRAST:  34mL OMNIPAQUE IOHEXOL 350 MG/ML SOLN COMPARISON:  Current chest radiograph FINDINGS: Cardiovascular: Satisfactory opacification of the pulmonary arteries to the segmental level. No evidence of pulmonary embolism. Heart is mildly enlarged. There are dense coronary artery calcifications. Aorta is dilated to 4.1 cm along its ascending portion. There are aortic atherosclerotic changes. Mediastinum/Nodes: No neck base or axillary masses or enlarged lymph nodes. Ill-defined soft tissue surrounds the left hilar structures. This is most evident along the anterior superior aspect of the left hilum. No discrete enlarged mediastinal lymph nodes. No right hilar masses or adenopathy. Trachea is patent. Esophagus is unremarkable. Lungs/Pleura: Moderate size subpulmonic right pleural effusion. Small left pleural effusion. There are multiple lung nodules. Several reference measurements were made. Largest lies in the left lower lobe along the oblique fissure, image 38, series 6, measuring 23 x 12 mm. Just above and anterior to this in the left upper lobe is an irregular 12 mm nodule. In the left lower lobe, image 48, series 6, there is a nodule measuring 17 x 14 mm. Anterior left upper lobe nodule, image 41, measuring 12 mm. Right upper lobe irregular  nodule, image 40, series 6, 12 mm. Nodule at the right lung base, right lower lobe, image 60, 12 mm. The abnormal soft tissue surrounding the hilar structures on the left, mostly surrounding the left upper lobe bronchus, measures approximately 2.8 x 2.4 x 3.0 cm. Abnormal soft tissue occludes the left upper lobe bronchus. Pleural-based opacities are noted most evident on the right posteriorly in the upper hemithorax.  No pneumothorax.  No convincing pneumonia.  No pulmonary edema. Upper Abdomen: There multiple low-attenuation liver masses consistent with metastatic disease. Largest lies along the inferior margin of the left lobe measuring 2.6 cm. Adrenal glands are thickened and not well-defined. Prominent peri celiac lymph nodes are noted. Musculoskeletal: No fracture or acute finding. No osteoblastic or osteolytic lesions. There is a circumscribed relatively dense mass along the deep inferior aspect of the left breast measuring 18 mm. This is likely a benign lesion such as a fibroadenoma. Review of the MIP images confirms the above findings. IMPRESSION: 1. Findings of metastatic disease with multiple lung nodules, pleural-based opacities and a central mass surrounding the anterior superior left hilar structures. Suspect that the hilar mass is a primary bronchogenic carcinoma. The left upper lobe bronchus is occluded, which may be from tumor or mucous plugging. There are also multiple liver masses consistent with metastatic disease. Possible adrenal metastatic disease. 2. Moderate subpulmonic right pleural effusion. Small left pleural effusion. 3. No evidence of pneumonia or pulmonary edema. 4. Dilation of the thoracic aorta, most prominently the ascending portion to 4.1 cm. Recommend annual imaging followup by CTA or MRA. This recommendation follows 2010 ACCF/AHA/AATS/ACR/ASA/SCA/SCAI/SIR/STS/SVM Guidelines for the Diagnosis and Management of Patients with Thoracic Aortic Disease. Circulation. 2010; 121: e266-e369  5. Circumscribed relatively dense mass along the deep inferior aspect of the left breast measuring 18 mm. This is likely a benign lesion such as a fibroadenoma. 6. Cardiomegaly with dense coronary artery calcifications. Aortic aneurysm NOS (ICD10-I71.9). Electronically Signed   By: Lajean Manes M.D.   On: 07/30/2017 18:32     ____________________________________________   PROCEDURES  Procedure(s) performed: None Procedures Critical Care performed:  None ____________________________________________   INITIAL IMPRESSION / ASSESSMENT AND PLAN / ED COURSE   84 y.o. female a history of advanced dementia, remote endometrial cancer, hypertension, CAD who presents for evaluation of right-sided chest pain and cough.  Patient is well-appearing, no distress, has normal vital signs, physical exam showing faint crackles on the right base, no other acute findings.  Patient chest x-ray is abnormal at baseline with widened mediastinum.  Radiology calling a right lower lobe pneumonia.  Patient has no fever and a normal white count.  We will send patient for CT angiogram of the chest to rule out PE and to confirm PNA.    _________________________ 7:17 PM on 07/30/2017 -----------------------------------------  CT is concerning for metastatic cancer.  Labs and vitals remained within normal limits.  Discussed with Dr. Talbert Cage, oncologist who recommended outpatient follow up in her office tomorrow or Tuesday. Labs she requested have been sent. Patient's three daughters are at the bedside and have been updated on findings and plan for outpatient management. Patient remains without any pain in the ED.    As part of my medical decision making, I reviewed the following data within the [place] History obtained from family, Nursing notes reviewed and incorporated, Labs reviewed , EKG interpreted , Old EKG reviewed, Old chart reviewed, Radiograph reviewed , A consult was requested and obtained from  this/these consultant(s) Oncology, Notes from prior ED visits and [place] Controlled Substance Database    Pertinent labs & imaging results that were available during my care of the patient were reviewed by me and considered in my medical decision making (see chart for details).    ____________________________________________   FINAL CLINICAL IMPRESSION(S) / ED DIAGNOSES  Final diagnoses:  Metastatic cancer ([place])      NEW MEDICATIONS STARTED DURING THIS VISIT:  ED Discharge Orders        Ordered    oxyCODONE (ROXICODONE) 5 MG immediate release tablet  Every 6 hours PRN     07/30/17 2035       Note:  This document was prepared using Dragon voice recognition software and may include unintentional dictation errors.    Rudene Re, MD 07/30/17 2036

## 2017-07-31 ENCOUNTER — Other Ambulatory Visit: Payer: Self-pay

## 2017-07-31 ENCOUNTER — Inpatient Hospital Stay: Payer: Medicare Other | Attending: Oncology | Admitting: Oncology

## 2017-07-31 ENCOUNTER — Encounter: Payer: Self-pay | Admitting: Oncology

## 2017-07-31 VITALS — BP 124/75 | HR 71 | Temp 96.4°F | Wt 112.0 lb

## 2017-07-31 DIAGNOSIS — N289 Disorder of kidney and ureter, unspecified: Secondary | ICD-10-CM | POA: Diagnosis not present

## 2017-07-31 DIAGNOSIS — R918 Other nonspecific abnormal finding of lung field: Secondary | ICD-10-CM | POA: Diagnosis not present

## 2017-07-31 DIAGNOSIS — F0390 Unspecified dementia without behavioral disturbance: Secondary | ICD-10-CM | POA: Diagnosis not present

## 2017-07-31 DIAGNOSIS — R160 Hepatomegaly, not elsewhere classified: Secondary | ICD-10-CM

## 2017-07-31 DIAGNOSIS — I10 Essential (primary) hypertension: Secondary | ICD-10-CM

## 2017-07-31 DIAGNOSIS — N1330 Unspecified hydronephrosis: Secondary | ICD-10-CM | POA: Diagnosis not present

## 2017-07-31 DIAGNOSIS — C541 Malignant neoplasm of endometrium: Secondary | ICD-10-CM | POA: Diagnosis not present

## 2017-07-31 DIAGNOSIS — J90 Pleural effusion, not elsewhere classified: Secondary | ICD-10-CM

## 2017-07-31 NOTE — Progress Notes (Signed)
Patient here today as a new patient, see in ED yesterday

## 2017-07-31 NOTE — Progress Notes (Signed)
Hematology/Oncology Consult note [facility] Telephone:([phone] Fax:([phone]   Patient Care Team: Petra Kuba, MD as PCP - General (Family Medicine)  REFERRING PROVIDER: ER CHIEF COMPLAINTS/PURPOSE OF CONSULTATION:  Evaluation of lung mass  HISTORY OF PRESENTING ILLNESS:  Sara Heath is a  84 y.o.  female with PMH listed below who was referred to me for evaluation of lung mass. Patient has a history of endometrial cancer status post radiation, dementia, hypertension, CAD, presented to emergency room yesterday for right-sided chest pain and flank pain.  Patient cannot provide detailed history, history mostly obtained from her daughter.  Patient has total 8 children including 5 daughters and 3 sons.  She lives with 1 of her daughters Sara Heath who accompany patient to today's visit.Sara Heath reports that patient has decreased oral intake recently.  No reports of any shortness of breath, hematochezia or hematemesis. Denies pain today.  Patient lives with daughter Sara Heath.  Sara Heath does not have power of attorney for medical decision.    ROS Unable to perform a complete review of system due to patient mental status.  Patient denies pain, denies any shortness of breath.  Poor oral intake  MEDICAL HISTORY:  Past Medical History:  Diagnosis Date  . Angioedema   . Asthma   . Breast mass, left   . CAD (coronary artery disease)   . Chronic back pain   . Chronic headache   . Dementia   . Dementia   . Edema of both legs   . Endometrial cancer ([place])   . Gout   . Hematuria   . Hemorrhagic cystitis   . Hypertension   . Malaise and fatigue   . Sleep apnea   . Torticollis   . UTI (lower urinary tract infection)   . Vaginal candidiasis   . Vulvar dystrophy   . Weight loss     SURGICAL HISTORY: Past Surgical History:  Procedure Laterality Date  . ABDOMINAL HYSTERECTOMY    . CORONARY ANGIOPLASTY WITH STENT PLACEMENT    . left breast biopsy      . tubal ligation      SOCIAL HISTORY: Social History   Socioeconomic History  . Marital status: Widowed    Spouse name: Not on file  . Number of children: Not on file  . Years of education: Not on file  . Highest education level: Not on file  Occupational History  . Not on file  Social Needs  . Financial resource strain: Not on file  . Food insecurity:    Worry: Not on file    Inability: Not on file  . Transportation needs:    Medical: Not on file    Non-medical: Not on file  Tobacco Use  . Smoking status: Never Smoker  . Smokeless tobacco: Never Used  Substance and Sexual Activity  . Alcohol use: No  . Drug use: No  . Sexual activity: Not on file  Lifestyle  . Physical activity:    Days per week: Not on file    Minutes per session: Not on file  . Stress: Not on file  Relationships  . Social connections:    Talks on phone: Not on file    Gets together: Not on file    Attends religious service: Not on file    Active member of club or organization: Not on file    Attends meetings of clubs or organizations: Not on file    Relationship status: Not on file  .  Intimate partner violence:    Fear of current or ex partner: Not on file    Emotionally abused: Not on file    Physically abused: Not on file    Forced sexual activity: Not on file  Other Topics Concern  . Not on file  Social History Narrative  . Not on file    FAMILY HISTORY: Family History  Family history unknown: Yes    ALLERGIES:  is allergic to penicillins.  MEDICATIONS:  Current Outpatient Medications  Medication Sig Dispense Refill  . amLODipine (NORVASC) 5 MG tablet Take 1 tablet (5 mg total) by mouth daily. 30 tablet 0  . aspirin EC 81 MG tablet Take 81 mg by mouth daily.     . cetirizine (ZYRTEC) 10 MG tablet Take 10 mg by mouth daily as needed for allergies.     . furosemide (LASIX) 40 MG tablet Take 40 mg by mouth.    . isosorbide mononitrate (IMDUR) 120 MG 24 hr tablet Take 120 mg by  mouth daily.     . nitroGLYCERIN (NITROSTAT) 0.4 MG SL tablet Place 0.4 mg under the tongue every 5 (five) minutes x 3 doses as needed for chest pain. *if not resolved , go to emergency department.*    . OxyCODONE HCl, Abuse Deter, (OXAYDO) 5 MG TABA Take by mouth.    . RA COL-RITE 100 MG capsule take 100 mg by mouth twice a day if needed for STOOL SOFTENING  0   No current facility-administered medications for this visit.      PHYSICAL EXAMINATION: ECOG PERFORMANCE STATUS: 2 - Symptomatic, <50% confined to bed Vitals:   07/31/17 1436  BP: 124/75  Pulse: 71  Temp: (!) 96.4 F (35.8 C)   Filed Weights   07/31/17 1436  Weight: 112 lb (50.8 kg)    Physical Exam  Constitutional: No distress.  HENT:  Head: Normocephalic and atraumatic.  Eyes: Pupils are equal, round, and reactive to light. Conjunctivae are normal.  Neck: Normal range of motion. Neck supple.  Cardiovascular: Normal rate and regular rhythm.  Pulmonary/Chest: Effort normal and breath sounds normal.  Abdominal: Soft. Bowel sounds are normal.  Musculoskeletal: She exhibits no edema.  Neurological: She is alert.  Oriented to person only,   Skin: Skin is warm and dry.  Psychiatric: She has a normal mood and affect.     LABORATORY DATA:  I have reviewed the data as listed Lab Results  Component Value Date   WBC 5.2 07/30/2017   HGB 11.2 (L) 07/30/2017   HCT 34.5 (L) 07/30/2017   MCV 89.8 07/30/2017   PLT 324 07/30/2017   Recent Labs    02/09/17 0025 04/29/17 1625 07/30/17 1613 07/30/17 1922  NA 137 140 134*  --   K 3.9 4.0 3.6  --   CL 105 106 102  --   CO2 26 27 25   --   GLUCOSE 105* 100* 103*  --   BUN 22* 17 15  --   CREATININE 0.83 0.95 0.63  --   CALCIUM 9.2 9.9 9.6  --   GFRNONAA 59* 50* >60  --   GFRAA >60 58* >60  --   PROT  --   --   --  7.1  ALBUMIN  --   --   --  3.0*  AST  --   --   --  23  ALT  --   --   --  11*  ALKPHOS  --   --   --  44  BILITOT  --   --   --  0.6  BILIDIR  --    --   --  0.1  IBILI  --   --   --  0.5       ASSESSMENT & PLAN:  1. Mass of lung   2. Pleural effusion   3. Liver masses   4. Endometrial cancer [facility])    Image results were reviewed by me personally and discussed with patient and her daughters. Clinically patient has widely metastatic disease, differential includes primary lung cancer with metastasis, metastatic endometrial cancer, etc.   Discuss that obtaining tissue diagnosis. Given patient's advanced age, performance status, advanced dementia, she may not be a good candidate for systemic chemotherapy. I do not think patient has decision capacity due to dementia, it is importance that her next of kin make a decision for patient whether to obtain tissue diagnosis, get treatment or let disease take natural course. It is a question whether patient can sign her power of attorney form.  Will check with social worker to see if need to obtain guardian for patient.   Sara Heath is interested in getting PET scan evaluation and then make decision.   All questions were answered. The patient knows to call the clinic with any problems questions or concerns.  Return of visit: Follow up after PET scan.   Earlie Server, MD, PhD Hematology Oncology [facility] at [facility] Pager- [phone] 07/31/2017

## 2017-08-07 ENCOUNTER — Ambulatory Visit
Admission: RE | Admit: 2017-08-07 | Discharge: 2017-08-07 | Disposition: A | Discharge status: Home / Self Care | Payer: Medicare Other | Source: Ambulatory Visit | Attending: Oncology | Admitting: Oncology

## 2017-08-07 DIAGNOSIS — K429 Umbilical hernia without obstruction or gangrene: Secondary | ICD-10-CM | POA: Diagnosis not present

## 2017-08-07 DIAGNOSIS — R918 Other nonspecific abnormal finding of lung field: Secondary | ICD-10-CM | POA: Insufficient documentation

## 2017-08-07 DIAGNOSIS — C786 Secondary malignant neoplasm of retroperitoneum and peritoneum: Secondary | ICD-10-CM | POA: Insufficient documentation

## 2017-08-07 DIAGNOSIS — N289 Disorder of kidney and ureter, unspecified: Secondary | ICD-10-CM | POA: Diagnosis not present

## 2017-08-07 DIAGNOSIS — I700 Atherosclerosis of aorta: Secondary | ICD-10-CM | POA: Insufficient documentation

## 2017-08-07 DIAGNOSIS — C7989 Secondary malignant neoplasm of other specified sites: Secondary | ICD-10-CM | POA: Diagnosis not present

## 2017-08-07 DIAGNOSIS — C787 Secondary malignant neoplasm of liver and intrahepatic bile duct: Secondary | ICD-10-CM | POA: Diagnosis not present

## 2017-08-07 LAB — GLUCOSE, CAPILLARY: Glucose-Capillary: 88 mg/dL (ref 65–99)

## 2017-08-07 MED ORDER — FLUDEOXYGLUCOSE F - 18 (FDG) INJECTION: 5.8000 | Freq: Once | INTRAVENOUS | Status: DC

## 2017-08-08 ENCOUNTER — Encounter
Admission: RE | Admit: 2017-08-08 | Discharge: 2017-08-08 | Disposition: A | Discharge status: Home / Self Care | Payer: Medicare Other | Source: Ambulatory Visit | Attending: Oncology | Admitting: Oncology

## 2017-08-08 ENCOUNTER — Inpatient Hospital Stay: Payer: Medicare Other | Admitting: Oncology

## 2017-08-08 DIAGNOSIS — R918 Other nonspecific abnormal finding of lung field: Secondary | ICD-10-CM | POA: Diagnosis not present

## 2017-08-08 LAB — GLUCOSE, CAPILLARY: GLUCOSE-CAPILLARY: 76 mg/dL (ref 65–99)

## 2017-08-08 MED ORDER — FLUDEOXYGLUCOSE F - 18 (FDG) INJECTION
6.1000 | Freq: Once | INTRAVENOUS | Status: AC | PRN
  Administered 2017-08-08: 6.1 via INTRAVENOUS

## 2017-08-10 ENCOUNTER — Telehealth: Payer: Self-pay | Admitting: Oncology

## 2017-08-10 ENCOUNTER — Inpatient Hospital Stay: Payer: Medicare Other | Admitting: Oncology

## 2017-08-10 NOTE — Telephone Encounter (Signed)
Rschd and conf MD/ PET RESULTS.  Unhappy with Provider. Patient requested to change MD from Dr Tasia Catchings to Dr Janese Banks.  Approved by Creola Corn.  Appt conf with Leighton Roach.

## 2017-08-11 ENCOUNTER — Telehealth: Payer: Self-pay | Admitting: *Deleted

## 2017-08-11 ENCOUNTER — Encounter: Payer: Self-pay | Admitting: Oncology

## 2017-08-11 ENCOUNTER — Inpatient Hospital Stay (HOSPITAL_BASED_OUTPATIENT_CLINIC_OR_DEPARTMENT_OTHER): Payer: Medicare Other | Admitting: Oncology

## 2017-08-11 VITALS — BP 124/73 | HR 83 | Temp 97.0°F | Resp 18 | Ht 63.0 in | Wt 114.0 lb

## 2017-08-11 DIAGNOSIS — R160 Hepatomegaly, not elsewhere classified: Secondary | ICD-10-CM

## 2017-08-11 DIAGNOSIS — R918 Other nonspecific abnormal finding of lung field: Secondary | ICD-10-CM

## 2017-08-11 DIAGNOSIS — J90 Pleural effusion, not elsewhere classified: Secondary | ICD-10-CM | POA: Diagnosis not present

## 2017-08-11 NOTE — Telephone Encounter (Signed)
Error, did not need encounter

## 2017-08-14 ENCOUNTER — Encounter: Payer: Self-pay | Admitting: Oncology

## 2017-08-14 NOTE — Progress Notes (Signed)
Hematology/Oncology Consult note [facility]  Telephone:([phone] [phone]  Patient Care Team: Petra Kuba, MD as PCP - General (Family Medicine)   Name of the patient: Sara Heath  [id number]  August 21, 1921   Date of visit: 08/14/17  Diagnosis- lung mass  Chief complaint/ Reason for visit- discuss PET/CT results  Heme/Onc history: Patient is a 84 year old female with a past medical significant for hypertension, CAD, endometrial cancer status post radiation and dementia.  Patient lives with her daughter [name] and is essentially wheelchair bound. She speaks very few sentences.  She ER with symptoms of right-sided chest pain and flank pain.  CT chest on 07/30/2017 showed multiple lung nodules, pleural nodules, left hilar central mass as well as multiple liver and renal metastases.  Patient was seen by Dr. Tasia Catchings on 07/31/2017 and a PET/CT scan was ordered at that time.  PET/CT on 08/08/2017 showed diffuse hypermetabolic disease in the chest abdomen.  Large left hilar mass present for neoplasm.  Bilateral hypermetabolic pulmonary nodules and right pleural disease.  Large hypermetabolic liver metastases with hypermetabolic adenopathy in the upper abdomen andextending into right pelvic sidewall.  Hypermetabolic 2.4 cm lesion in the left kidney.  Mild to moderate right hydro-urine hydronephrosis down to the level of the right pelvic sidewall.  Etiology is possible metastatic disease along the right pelvic sidewall.  Interval history-patient is here with 4 of her daughters as well as her daughter-in-law and granddaughters.  She has 8 children including 5 daughters and 3 sons.  She does not have a family appointed healthcare proxy but teresa has been the one making decisions on her behalf after consulting the entire family.  Patient spouse is not alive. Today patient denies any pain. She appears comfortable but does not verbalize much  ECOG PS- 2 Pain scale-  0  Review of systems- Review of Systems  Unable to perform ROS: Dementia    Allergies  Allergen Reactions  . Penicillins Anaphylaxis    Has patient had a PCN reaction causing immediate rash, facial/tongue/throat swelling, SOB or lightheadedness with hypotension: Yes Has patient had a PCN reaction causing severe rash involving mucus membranes or skin necrosis: No Has patient had a PCN reaction that required hospitalization No Has patient had a PCN reaction occurring within the last 10 years: No If all of the above answers are "NO", then may proceed with Cephalosporin use.     Past Medical History:  Diagnosis Date  . Angioedema   . Asthma   . Breast mass, left   . CAD (coronary artery disease)   . Chronic back pain   . Chronic headache   . Dementia   . Dementia   . Edema of both legs   . Endometrial cancer ([place])   . Gout   . Hematuria   . Hemorrhagic cystitis   . Hypertension   . Malaise and fatigue   . Sleep apnea   . Torticollis   . UTI (lower urinary tract infection)   . Vaginal candidiasis   . Vulvar dystrophy   . Weight loss      Past Surgical History:  Procedure Laterality Date  . ABDOMINAL HYSTERECTOMY    . CORONARY ANGIOPLASTY WITH STENT PLACEMENT    . left breast biopsy    . tubal ligation      Social History   Socioeconomic History  . Marital status: Widowed    Spouse name: Not on file  . Number of children: Not on file  .  Years of education: Not on file  . Highest education level: Not on file  Occupational History  . Not on file  Social Needs  . Financial resource strain: Not on file  . Food insecurity:    Worry: Not on file    Inability: Not on file  . Transportation needs:    Medical: Not on file    Non-medical: Not on file  Tobacco Use  . Smoking status: Never Smoker  . Smokeless tobacco: Never Used  Substance and Sexual Activity  . Alcohol use: No  . Drug use: No  . Sexual activity: Not on file  Lifestyle  . Physical activity:     Days per week: Not on file    Minutes per session: Not on file  . Stress: Not on file  Relationships  . Social connections:    Talks on phone: Not on file    Gets together: Not on file    Attends religious service: Not on file    Active member of club or organization: Not on file    Attends meetings of clubs or organizations: Not on file    Relationship status: Not on file  . Intimate partner violence:    Fear of current or ex partner: Not on file    Emotionally abused: Not on file    Physically abused: Not on file    Forced sexual activity: Not on file  Other Topics Concern  . Not on file  Social History Narrative  . Not on file    Family History  Family history unknown: Yes     Current Outpatient Medications:  .  amLODipine (NORVASC) 5 MG tablet, Take 1 tablet (5 mg total) by mouth daily., Disp: 30 tablet, Rfl: 0 .  aspirin EC 81 MG tablet, Take 81 mg by mouth daily. , Disp: , Rfl:  .  cetirizine (ZYRTEC) 10 MG tablet, Take 10 mg by mouth daily as needed for allergies. , Disp: , Rfl:  .  furosemide (LASIX) 40 MG tablet, Take 40 mg by mouth daily. , Disp: , Rfl:  .  isosorbide mononitrate (IMDUR) 120 MG 24 hr tablet, Take 120 mg by mouth daily. , Disp: , Rfl:  .  OxyCODONE HCl, Abuse Deter, (OXAYDO) 5 MG TABA, Take by mouth., Disp: , Rfl:  .  RA COL-RITE 100 MG capsule, take 100 mg by mouth twice a day if needed for STOOL SOFTENING, Disp: , Rfl: 0 .  nitroGLYCERIN (NITROSTAT) 0.4 MG SL tablet, Place 0.4 mg under the tongue every 5 (five) minutes x 3 doses as needed for chest pain. *if not resolved , go to emergency department.*, Disp: , Rfl:   Physical exam:  Vitals:   08/11/17 1349  BP: 124/73  Pulse: 83  Resp: 18  Temp: (!) 97 F (36.1 C)  TempSrc: Tympanic  SpO2: 99%  Weight: 114 lb (51.7 kg)  Height: 5\' 3"  (1.6 m)   Physical Exam  Constitutional:  Thin elderly female sitting in a wheelchair. Does not appear to be in any acute distress. She is oriented to  self only  HENT:  Head: Normocephalic and atraumatic.  Eyes: Pupils are equal, round, and reactive to light. EOM are normal.  Neck: Normal range of motion.  Cardiovascular: Normal rate, regular rhythm and normal heart sounds.  Pulmonary/Chest: Effort normal and breath sounds normal.  Abdominal: Soft. Bowel sounds are normal.  Neurological: She is alert.  Skin: Skin is warm and dry.     CMP Latest  Ref Rng & Units 07/30/2017  Glucose 65 - 99 mg/dL 103(H)  BUN 6 - 20 mg/dL 15  Creatinine 0.44 - 1.00 mg/dL 0.63  Sodium 135 - 145 mmol/L 134(L)  Potassium 3.5 - 5.1 mmol/L 3.6  Chloride 101 - 111 mmol/L 102  CO2 22 - 32 mmol/L 25  Calcium 8.9 - 10.3 mg/dL 9.6  Total Protein 6.5 - 8.1 g/dL 7.1  Total Bilirubin 0.3 - 1.2 mg/dL 0.6  Alkaline Phos 38 - 126 U/L 44  AST 15 - 41 U/L 23  ALT 14 - 54 U/L 11(L)   CBC Latest Ref Rng & Units 07/30/2017  WBC 3.6 - 11.0 K/uL 5.2  Hemoglobin 12.0 - 16.0 g/dL 11.2(L)  Hematocrit 35.0 - 47.0 % 34.5(L)  Platelets 150 - 440 K/uL 324    No images are attached to the encounter.  Dg Chest 2 View  Result Date: 07/30/2017 CLINICAL DATA:  Patient poor historian at this time. Reports to this tech, pain across the front of her upper chest. Patient reports to triage RN: Pt reports today she began having rt sided rib/chest pain. Reports pain with movement and breathing. Pt very vague about pain. Pt told her daughter that she was hurting in her flank. hx HTN, left breast mass, asthma, coronary stent placement. Non-smoker EXAM: CHEST - 2 VIEW COMPARISON:  04/29/2017 FINDINGS: Cardiac silhouette is borderline enlarged. Aorta is enlarged. No mediastinal or hilar masses. There is opacity at the right lung base silhouetting the right hemidiaphragm. There is small areas of hazy opacity in the mid lungs. No pleural effusion.  No pneumothorax. Skeletal structures are demineralized but grossly intact. IMPRESSION: 1. Right lung base opacity consistent with pneumonia. Smaller  subtle areas hazy opacity in the perihilar regions may reflect additional areas of infection. No evidence of pulmonary edema. Electronically Signed   By: Lajean Manes M.D.   On: 07/30/2017 16:39   Ct Angio Chest Pe W And/or Wo Contrast  Result Date: 07/30/2017 CLINICAL DATA:  Pt reports today she began having rt sided rib/chest pain. Reports pain with movement and breathing. Pt very vague about pain. Pt told her daughter that she was hurting in her flank EXAM: CT ANGIOGRAPHY CHEST WITH CONTRAST TECHNIQUE: Multidetector CT imaging of the chest was performed using the standard protocol during bolus administration of intravenous contrast. Multiplanar CT image reconstructions and MIPs were obtained to evaluate the vascular anatomy. CONTRAST:  43mL OMNIPAQUE IOHEXOL 350 MG/ML SOLN COMPARISON:  Current chest radiograph FINDINGS: Cardiovascular: Satisfactory opacification of the pulmonary arteries to the segmental level. No evidence of pulmonary embolism. Heart is mildly enlarged. There are dense coronary artery calcifications. Aorta is dilated to 4.1 cm along its ascending portion. There are aortic atherosclerotic changes. Mediastinum/Nodes: No neck base or axillary masses or enlarged lymph nodes. Ill-defined soft tissue surrounds the left hilar structures. This is most evident along the anterior superior aspect of the left hilum. No discrete enlarged mediastinal lymph nodes. No right hilar masses or adenopathy. Trachea is patent. Esophagus is unremarkable. Lungs/Pleura: Moderate size subpulmonic right pleural effusion. Small left pleural effusion. There are multiple lung nodules. Several reference measurements were made. Largest lies in the left lower lobe along the oblique fissure, image 38, series 6, measuring 23 x 12 mm. Just above and anterior to this in the left upper lobe is an irregular 12 mm nodule. In the left lower lobe, image 48, series 6, there is a nodule measuring 17 x 14 mm. Anterior left upper lobe  nodule, image  41, measuring 12 mm. Right upper lobe irregular nodule, image 40, series 6, 12 mm. Nodule at the right lung base, right lower lobe, image 60, 12 mm. The abnormal soft tissue surrounding the hilar structures on the left, mostly surrounding the left upper lobe bronchus, measures approximately 2.8 x 2.4 x 3.0 cm. Abnormal soft tissue occludes the left upper lobe bronchus. Pleural-based opacities are noted most evident on the right posteriorly in the upper hemithorax. No pneumothorax.  No convincing pneumonia.  No pulmonary edema. Upper Abdomen: There multiple low-attenuation liver masses consistent with metastatic disease. Largest lies along the inferior margin of the left lobe measuring 2.6 cm. Adrenal glands are thickened and not well-defined. Prominent peri celiac lymph nodes are noted. Musculoskeletal: No fracture or acute finding. No osteoblastic or osteolytic lesions. There is a circumscribed relatively dense mass along the deep inferior aspect of the left breast measuring 18 mm. This is likely a benign lesion such as a fibroadenoma. Review of the MIP images confirms the above findings. IMPRESSION: 1. Findings of metastatic disease with multiple lung nodules, pleural-based opacities and a central mass surrounding the anterior superior left hilar structures. Suspect that the hilar mass is a primary bronchogenic carcinoma. The left upper lobe bronchus is occluded, which may be from tumor or mucous plugging. There are also multiple liver masses consistent with metastatic disease. Possible adrenal metastatic disease. 2. Moderate subpulmonic right pleural effusion. Small left pleural effusion. 3. No evidence of pneumonia or pulmonary edema. 4. Dilation of the thoracic aorta, most prominently the ascending portion to 4.1 cm. Recommend annual imaging followup by CTA or MRA. This recommendation follows 2010 ACCF/AHA/AATS/ACR/ASA/SCA/SCAI/SIR/STS/SVM Guidelines for the Diagnosis and Management of Patients  with Thoracic Aortic Disease. Circulation. 2010; 121: [id number] 5. Circumscribed relatively dense mass along the deep inferior aspect of the left breast measuring 18 mm. This is likely a benign lesion such as a fibroadenoma. 6. Cardiomegaly with dense coronary artery calcifications. Aortic aneurysm NOS (ICD10-I71.9). Electronically Signed   By: Lajean Manes M.D.   On: 07/30/2017 18:32   Nm Pet Image Restag (ps) Skull Base To Thigh  Result Date: 08/08/2017 CLINICAL DATA:  Initial treatment strategy for lung mass with metastases. Patient has a history of endometrial cancer. EXAM: NUCLEAR MEDICINE PET SKULL BASE TO THIGH TECHNIQUE: 6.1 mCi F-18 FDG was injected intravenously. Full-ring PET imaging was performed from the skull base to thigh after the radiotracer. CT data was obtained and used for attenuation correction and anatomic localization. Fasting blood glucose: 76 mg/dl COMPARISON:  Chest CT 07/30/2017 FINDINGS: Mediastinal blood pool activity: SUV max 1.6 NECK: No hypermetabolic lymph nodes in the neck. 2.5 cm right thyroid nodule is hypermetabolic with SUV max = 9.6. Incidental CT findings: none CHEST: The dominant anterior left hilar mass is markedly hypermetabolic with SUV max = 40.9. Hypermetabolic mediastinal and bilateral hilar lymphadenopathy is associated. 15 mm nodule posterior left lung (image 75/series 3) demonstrates SUV max = 6.3. Other bilateral pulmonary nodules are hypermetabolic. Hypermetabolic pleural lesions are identified more on the right than the left. There is diffuse pleural hypermetabolism posteriorly in the right costophrenic sulcus. Incidental CT findings: Heart is enlarged. Coronary artery calcification is evident. Atherosclerotic calcification is noted in the wall of the thoracic aorta. Small left pleural effusion associated with subpulmonic small right pleural effusion. ABDOMEN/PELVIS: Hypermetabolic liver lesions are identified including 3.5 cm posterior left liver lesion  (130/3) with SUV max = 11.4. Hypermetabolic retroperitoneal lymphadenopathy is evident in the upper abdomen. 13 mm short axis lymph  node posterior to the right renal artery demonstrates SUV max = 8.4 (128/3). Bilateral adrenal thickening is evident although no definite adrenal hypermetabolism. 15 mm short axis left para-aortic lymph node (135/3) has SUV max = 7.5. 13 mm right common iliac lymph node (156/3) demonstrates SUV max = 8.1. Hypermetabolic lymphadenopathy evident along the right pelvic sidewall. Subtle 2.5 cm lesion in the medial cortex of the interpolar right kidney is hypermetabolic with SUV max = 42.7. Incidental CT findings: Mild to moderate right hydroureteronephrosis is evident. Right ureter is distended down to the level of the right pelvic sidewall although etiology for the urinary obstruction is not evident. The uterus appears enlarged and heterogeneous but shows no hypermetabolic FDG accumulation. Layering tiny gallstones evident. Small volume intraperitoneal free fluid noted in the abdomen and pelvis with diffuse mesenteric and body wall edema. Mild circumferential bladder wall thickening is evident. Densely calcified structures in the central mesentery may represent lymph nodes. SKELETON: No focal hypermetabolic activity to suggest skeletal metastasis. Incidental CT findings: Bones are diffusely demineralized. IMPRESSION: 1. Diffuse hypermetabolic metastatic disease in the chest, abdomen, and pelvis. There is a large left hilar mass that may represent primary bronchogenic neoplasm. 2. Bilateral hypermetabolic pulmonary nodules with relatively diffuse hypermetabolic right pleural disease. 3. Large hypermetabolic liver metastases with hypermetabolic lymphadenopathy in the upper abdomen and retroperitoneal space extending down into the right pelvic sidewall. 4. Hypermetabolic 2.4 cm lesion in the medial left kidney. This may represent a metastatic deposit or potentially primary renal neoplasm. 5.  Mild to moderate right hydroureteronephrosis down to the level of the right pelvic sidewall. Distal right ureter extending into the bladder is nondilated. As such, etiology of this obstruction is probably metastatic disease along the right pelvic sidewall. 6. Hypermetabolic right thyroid lesion. 7. Uterus appears enlarged and heterogeneous, but demonstrates no hypermetabolic activity. 8.  Aortic Atherosclerois (ICD10-170.0) 9. Small umbilical hernia contains small volume fluid. This is associated with small volume intraperitoneal free fluid. Electronically Signed   By: Misty Stanley M.D.   On: 08/08/2017 12:04     Assessment and plan- Patient is a 84 y.o. female with central left hilar mass along with pleural, liver mets, b/l pulmonary nodules and diffuse adenopathy  I have reviewed PET/CT images independently. I discussed the findings with patients family. Findings are highly suspicious for metastatic lung cancer given the dominant left hilar mass. Patient has a remote history of smoking for a few years during her college years but otherwise has never smoked.   Discussed that if this is lung cancer- this could be small cell versus non small cell lung cancer. Given that she has a remote h/o smoker, we could be dealing with NSCLC that may have actionable mutations. Discussed role of chemotherapy versus immunotherapy versus targeted therapy in lung cancer. Also discussed MRI brain that would be needed to complete staging  Patients family understand and would like to get a diagnosis. They would like MRI brain first. If that shows metastatic disease- they are leaning towards no treatment including no radiation. If no brain mets- they are willing for patient to going through liver biopsy for definitive tissue diagnosis and omniseq testing  They will consider targeted therapy or immunotherapy if she is a candidate but are leaning towards no chemotherapy.   I will see her back after MRI and biopsy to discuss  results and further management  Also discussed need to establish health care proxy. [name] is not her appointed HCP. 4 of her daughters are present today.  1 daughter and 3 sons not here. 68 of adult children will be making decisions as her spouse is no more. I will touch base with our social worker as well regarding this. Discussed CODE status. Children agree that she would benefit from DNR/DNI. They will think over this further.   Total face to face encounter time for this patient visit was 45 min. >50% of the time was  spent in counseling and coordination of care.       Visit Diagnosis 1. Mass of lung   2. Pleural effusion   3. Liver masses      Dr. Randa Evens, MD, MPH [facility] [phone] 08/14/2017 10:40 AM

## 2017-08-15 ENCOUNTER — Telehealth: Payer: Self-pay | Admitting: *Deleted

## 2017-08-15 DIAGNOSIS — R918 Other nonspecific abnormal finding of lung field: Secondary | ICD-10-CM

## 2017-08-15 DIAGNOSIS — R319 Hematuria, unspecified: Secondary | ICD-10-CM

## 2017-08-15 DIAGNOSIS — R160 Hepatomegaly, not elsewhere classified: Secondary | ICD-10-CM

## 2017-08-15 NOTE — Telephone Encounter (Signed)
Family member called to say that patient went to bathroom and there was blood. After asking several questions she has urinated several times today but only 1 urination had a lot of blood in it.  I told her that if she has another urination that looks like the one earlier today then she needs to go to ER.  Otherwise if she just has a little blood with her urine then she can come over tom am at 9:15 to get ua, culture and blood work.  Family is agreeable and will come over tom. And I reiterated that if she has another episode of lots of blood to go to ER. Daughter is agreeable.

## 2017-08-15 NOTE — Telephone Encounter (Signed)
-----   Message from Wilburn Cornelia sent at 08/15/2017  2:38 PM EDT ----- Regarding: urinating blood Contact: [phone] Pt family called again- pt urinating blood- sent the previous call to [facility] phone.

## 2017-08-16 ENCOUNTER — Telehealth: Payer: Self-pay | Admitting: *Deleted

## 2017-08-16 ENCOUNTER — Inpatient Hospital Stay: Payer: Medicare Other

## 2017-08-16 DIAGNOSIS — R319 Hematuria, unspecified: Secondary | ICD-10-CM

## 2017-08-16 DIAGNOSIS — R160 Hepatomegaly, not elsewhere classified: Secondary | ICD-10-CM

## 2017-08-16 DIAGNOSIS — R918 Other nonspecific abnormal finding of lung field: Secondary | ICD-10-CM

## 2017-08-16 LAB — URINALYSIS, COMPLETE (UACMP) WITH MICROSCOPIC
BILIRUBIN URINE: NEGATIVE
Glucose, UA: NEGATIVE mg/dL
Ketones, ur: NEGATIVE mg/dL
NITRITE: NEGATIVE
PH: 6 (ref 5.0–8.0)
Protein, ur: NEGATIVE mg/dL
SPECIFIC GRAVITY, URINE: 1.006 (ref 1.005–1.030)

## 2017-08-16 LAB — CBC WITH DIFFERENTIAL/PLATELET
BASOS ABS: 0 10*3/uL (ref 0–0.1)
BASOS PCT: 0 %
EOS ABS: 0.1 10*3/uL (ref 0–0.7)
EOS PCT: 2 %
HCT: 31 % — ABNORMAL LOW (ref 35.0–47.0)
HEMOGLOBIN: 10.1 g/dL — AB (ref 12.0–16.0)
Lymphocytes Relative: 8 %
Lymphs Abs: 0.4 10*3/uL — ABNORMAL LOW (ref 1.0–3.6)
MCH: 29.4 pg (ref 26.0–34.0)
MCHC: 32.6 g/dL (ref 32.0–36.0)
MCV: 90.1 fL (ref 80.0–100.0)
Monocytes Absolute: 0.5 10*3/uL (ref 0.2–0.9)
Monocytes Relative: 9 %
NEUTROS PCT: 81 %
Neutro Abs: 4.3 10*3/uL (ref 1.4–6.5)
PLATELETS: 283 10*3/uL (ref 150–440)
RBC: 3.45 MIL/uL — AB (ref 3.80–5.20)
RDW: 16.6 % — ABNORMAL HIGH (ref 11.5–14.5)
WBC: 5.3 10*3/uL (ref 3.6–11.0)

## 2017-08-16 LAB — COMPREHENSIVE METABOLIC PANEL
ALK PHOS: 44 U/L (ref 38–126)
ALT: 10 U/L — ABNORMAL LOW (ref 14–54)
ANION GAP: 9 (ref 5–15)
AST: 19 U/L (ref 15–41)
Albumin: 2.9 g/dL — ABNORMAL LOW (ref 3.5–5.0)
BUN: 19 mg/dL (ref 6–20)
CALCIUM: 9.6 mg/dL (ref 8.9–10.3)
CO2: 25 mmol/L (ref 22–32)
Chloride: 104 mmol/L (ref 101–111)
Creatinine, Ser: 0.8 mg/dL (ref 0.44–1.00)
GFR calc Af Amer: >60 mL/min (ref >60)
GFR calc non Af Amer: >60 mL/min (ref >60)
GLUCOSE: 99 mg/dL (ref 65–99)
POTASSIUM: 3.8 mmol/L (ref 3.5–5.1)
SODIUM: 138 mmol/L (ref 135–145)
TOTAL PROTEIN: 7 g/dL (ref 6.5–8.1)
Total Bilirubin: 0.6 mg/dL (ref 0.3–1.2)

## 2017-08-16 MED ORDER — SULFAMETHOXAZOLE-TRIMETHOPRIM 800-160 MG PO TABS: 1.0000 | ORAL_TABLET | Freq: Two times a day (BID) | ORAL | 0 refills | Status: AC

## 2017-08-16 NOTE — Telephone Encounter (Signed)
Called daughter [name] and said that there was  No rbc in urine but did show wbc and bacteria. Asked if pt was going to bathroom more often than usual, was burning  Or hurting when she urinates or a odor to her urine. [name] says she does have a odor when she urinates but no c/o of other sx. I called Dr. Janese Banks and she said to start her with bactrim and it was sent into pharmacy and called daughter back to let her know. She will pick up rx for her.

## 2017-08-18 ENCOUNTER — Ambulatory Visit
Admission: RE | Admit: 2017-08-18 | Discharge: 2017-08-18 | Disposition: A | Discharge status: Home / Self Care | Payer: Medicare Other | Source: Ambulatory Visit | Attending: Oncology | Admitting: Oncology

## 2017-08-18 DIAGNOSIS — R160 Hepatomegaly, not elsewhere classified: Secondary | ICD-10-CM | POA: Diagnosis present

## 2017-08-18 DIAGNOSIS — R918 Other nonspecific abnormal finding of lung field: Secondary | ICD-10-CM | POA: Diagnosis present

## 2017-08-18 DIAGNOSIS — G319 Degenerative disease of nervous system, unspecified: Secondary | ICD-10-CM | POA: Diagnosis not present

## 2017-08-18 LAB — URINE CULTURE

## 2017-08-18 MED ORDER — GADOBENATE DIMEGLUMINE 529 MG/ML IV SOLN
10.0000 mL | Freq: Once | INTRAVENOUS | Status: AC | PRN
  Administered 2017-08-18: 10 mL via INTRAVENOUS

## 2017-08-19 ENCOUNTER — Other Ambulatory Visit: Payer: Self-pay

## 2017-08-19 ENCOUNTER — Emergency Department
Admission: EM | Admit: 2017-08-19 | Discharge: 2017-08-19 | Disposition: A | Discharge status: Home / Self Care | Payer: Medicare Other | Attending: Emergency Medicine | Admitting: Emergency Medicine

## 2017-08-19 ENCOUNTER — Encounter: Payer: Self-pay | Admitting: Emergency Medicine

## 2017-08-19 ENCOUNTER — Emergency Department: Payer: Medicare Other

## 2017-08-19 DIAGNOSIS — R0789 Other chest pain: Secondary | ICD-10-CM

## 2017-08-19 DIAGNOSIS — I10 Essential (primary) hypertension: Secondary | ICD-10-CM | POA: Insufficient documentation

## 2017-08-19 DIAGNOSIS — J45909 Unspecified asthma, uncomplicated: Secondary | ICD-10-CM | POA: Insufficient documentation

## 2017-08-19 DIAGNOSIS — F0390 Unspecified dementia without behavioral disturbance: Secondary | ICD-10-CM | POA: Diagnosis not present

## 2017-08-19 DIAGNOSIS — I2510 Atherosclerotic heart disease of native coronary artery without angina pectoris: Secondary | ICD-10-CM | POA: Insufficient documentation

## 2017-08-19 DIAGNOSIS — R079 Chest pain, unspecified: Secondary | ICD-10-CM | POA: Diagnosis present

## 2017-08-19 DIAGNOSIS — Z79899 Other long term (current) drug therapy: Secondary | ICD-10-CM | POA: Diagnosis not present

## 2017-08-19 DIAGNOSIS — Z7982 Long term (current) use of aspirin: Secondary | ICD-10-CM | POA: Insufficient documentation

## 2017-08-19 LAB — CBC
HCT: 35 % (ref 35.0–47.0)
HEMOGLOBIN: 11.2 g/dL — AB (ref 12.0–16.0)
MCH: 29.1 pg (ref 26.0–34.0)
MCHC: 32.1 g/dL (ref 32.0–36.0)
MCV: 90.7 fL (ref 80.0–100.0)
PLATELETS: 308 10*3/uL (ref 150–440)
RBC: 3.86 MIL/uL (ref 3.80–5.20)
RDW: 17.2 % — AB (ref 11.5–14.5)
WBC: 3.7 10*3/uL (ref 3.6–11.0)

## 2017-08-19 LAB — BASIC METABOLIC PANEL
Anion gap: 8 (ref 5–15)
BUN: 24 mg/dL — AB (ref 6–20)
CALCIUM: 9.5 mg/dL (ref 8.9–10.3)
CO2: 28 mmol/L (ref 22–32)
CREATININE: 1.1 mg/dL — AB (ref 0.44–1.00)
Chloride: 103 mmol/L (ref 101–111)
GFR calc Af Amer: 49 mL/min — ABNORMAL LOW (ref >60)
GFR, EST NON AFRICAN AMERICAN: 42 mL/min — AB (ref >60)
GLUCOSE: 102 mg/dL — AB (ref 65–99)
Potassium: 3.4 mmol/L — ABNORMAL LOW (ref 3.5–5.1)
Sodium: 139 mmol/L (ref 135–145)

## 2017-08-19 LAB — HEPATIC FUNCTION PANEL
ALT: 11 U/L — ABNORMAL LOW (ref 14–54)
AST: 21 U/L (ref 15–41)
Albumin: 2.9 g/dL — ABNORMAL LOW (ref 3.5–5.0)
Alkaline Phosphatase: 43 U/L (ref 38–126)
Total Bilirubin: 0.5 mg/dL (ref 0.3–1.2)
Total Protein: 6.8 g/dL (ref 6.5–8.1)

## 2017-08-19 LAB — TROPONIN I

## 2017-08-19 NOTE — ED Triage Notes (Signed)
Chronic upper chest pain due to lung cancer, newly diagnosed 2 weeks ago, states increased this am.

## 2017-08-19 NOTE — ED Notes (Signed)
Pt discharging home with daughters. IV removed pt alert and resting comfortably. Pt accompanied outside by family and staff via wheelchair.

## 2017-08-19 NOTE — ED Notes (Signed)
Pt returned from Xray at this time  

## 2017-08-19 NOTE — ED Provider Notes (Signed)
[facility] Emergency Department Provider Note  ____________________________________________   First MD Initiated Contact with Patient 08/19/17 1332     (approximate)  I have reviewed the triage vital signs and the nursing notes.   HISTORY  Chief Complaint Chest Pain   HPI MALEA SWILLING is a 84 y.o. female is brought to the emergency department with her family with increasing left upper chest pain.  She has a new diagnosis of metastatic lung cancer.  She notes chronic pain however it has been worse for the past 24 hours or so.  It is moderate to severe throbbing aching nonradiating in her left upper chest.  Somewhat worse with deep inspiration.  Not worse with exertion.  Not particularly positional.  No leg swelling.  No history of DVT or pulmonary embolism.  Nothing seems to make the symptoms better or worse.  Past Medical History:  Diagnosis Date  . Angioedema   . Asthma   . Breast mass, left   . CAD (coronary artery disease)   . Chronic back pain   . Chronic headache   . Dementia   . Dementia   . Edema of both legs   . Endometrial cancer ([place])   . Gout   . Hematuria   . Hemorrhagic cystitis   . Hypertension   . Malaise and fatigue   . Sleep apnea   . Torticollis   . UTI (lower urinary tract infection)   . Vaginal candidiasis   . Vulvar dystrophy   . Weight loss     Patient Active Problem List   Diagnosis Date Noted  . Chest pain 02/08/2017  . Malnutrition of moderate degree 09/08/2015  . Pancytopenia ([place]) 09/08/2015  . Acute cystitis with hematuria 09/08/2015  . General weakness 09/08/2015  . Dementia 09/08/2015  . Syncope 09/06/2015  . CAD (coronary artery disease) 09/06/2015  . HTN, goal below 140/80 09/06/2015  . History of endometrial cancer 09/06/2015    Past Surgical History:  Procedure Laterality Date  . ABDOMINAL HYSTERECTOMY    . CORONARY ANGIOPLASTY WITH STENT PLACEMENT    . left breast biopsy    . tubal ligation       Prior to Admission medications   Medication Sig Start Date End Date Taking? Authorizing Provider  amLODipine (NORVASC) 5 MG tablet Take 1 tablet (5 mg total) by mouth daily. 05/19/16   Fritzi Mandes, MD  aspirin EC 81 MG tablet Take 81 mg by mouth daily.     [provider]  cetirizine (ZYRTEC) 10 MG tablet Take 10 mg by mouth daily as needed for allergies.  11/26/14   [provider]  furosemide (LASIX) 40 MG tablet Take 40 mg by mouth daily.     [provider]  isosorbide mononitrate (IMDUR) 120 MG 24 hr tablet Take 120 mg by mouth daily.  11/13/14   [provider]  nitroGLYCERIN (NITROSTAT) 0.4 MG SL tablet Place 0.4 mg under the tongue every 5 (five) minutes x 3 doses as needed for chest pain. *if not resolved , go to emergency department.*    [provider]  OxyCODONE HCl, Abuse Deter, (OXAYDO) 5 MG TABA Take by mouth.    [provider]  RA COL-RITE 100 MG capsule take 100 mg by mouth twice a day if needed for STOOL SOFTENING 10/01/14   [provider]  sulfamethoxazole-trimethoprim (BACTRIM DS,SEPTRA DS) 800-160 MG tablet Take 1 tablet by mouth 2 (two) times daily. 08/16/17   Randa Evens  C, MD    Allergies Penicillins  Family History  Family history unknown: Yes    Social History Social History   Tobacco Use  . Smoking status: Never Smoker  . Smokeless tobacco: Never Used  Substance Use Topics  . Alcohol use: No  . Drug use: No    Review of Systems Constitutional: No fever/chills Eyes: No visual changes. ENT: No sore throat. Cardiovascular: Positive for chest pain. Respiratory: Denies shortness of breath. Gastrointestinal: No abdominal pain.  No nausea, no vomiting.  No diarrhea.  No constipation. Genitourinary: Negative for dysuria. Musculoskeletal: Negative for back pain. Skin: Negative for rash. Neurological: Negative for headaches, focal weakness or  numbness.   ____________________________________________   PHYSICAL EXAM:  VITAL SIGNS: ED Triage Vitals  Enc Vitals Group     BP 08/19/17 1141 114/66     Pulse Rate 08/19/17 1141 85     Resp 08/19/17 1141 14     Temp 08/19/17 1141 97.6 F (36.4 C)     Temp src --      SpO2 08/19/17 1141 94 %     Weight 08/19/17 1142 114 lb (51.7 kg)     Height 08/19/17 1142 5\' 2"  (1.575 m)     Head Circumference --      Peak Flow --      Pain Score 08/19/17 1146 10     Pain Loc --      Pain Edu? --      Excl. in [place]? --     Constitutional: Alert and oriented x4 chronically ill appearing somewhat cachectic nontoxic no diaphoresis speaks full clear sentences Eyes: PERRL EOMI. Head: Atraumatic. Nose: No congestion/rhinnorhea. Mouth/Throat: No trismus Neck: No stridor.   Cardiovascular: Normal rate, regular rhythm. Grossly normal heart sounds.  Good peripheral circulation. Focally tender over left anterior upper chest Respiratory: Normal respiratory effort.  No retractions. Lungs CTAB and moving good air Gastrointestinal: Soft nontender Musculoskeletal: No lower extremity edema legs are equal in size Neurologic:  Normal speech and language. No gross focal neurologic deficits are appreciated. Skin:  Skin is warm, dry and intact. No rash noted. Psychiatric: Mood and affect are normal. Speech and behavior are normal.    ____________________________________________   DIFFERENTIAL includes but not limited to  Acute coronary syndrome, pulmonary embolism, pneumothorax, pleural effusion, malignancy ____________________________________________   LABS (all labs ordered are listed, but only abnormal results are displayed)  Labs Reviewed  CBC - Abnormal; Notable for the following components:      Result Value   Hemoglobin 11.2 (*)    RDW 17.2 (*)    All other components within normal limits  BASIC METABOLIC PANEL - Abnormal; Notable for the following components:   Potassium 3.4 (*)     Glucose, Bld 102 (*)    BUN 24 (*)    Creatinine, Ser 1.10 (*)    GFR calc non Af Amer 42 (*)    GFR calc Af Amer 49 (*)    All other components within normal limits  HEPATIC FUNCTION PANEL - Abnormal; Notable for the following components:   Albumin 2.9 (*)    ALT 11 (*)    Bilirubin, Direct <0.1 (*)    All other components within normal limits  TROPONIN I    Lab work reviewed by me with low albumin consistent with chronically poor nutrition __________________________________________  EKG  ED ECG REPORT I, Darel Hong, the attending physician, personally viewed and interpreted this ECG.  Date: 08/19/2017 EKG Time:  Rate: 84  Rhythm: normal sinus rhythm QRS Axis: Left axis Intervals: normal ST/T Wave abnormalities: normal Narrative Interpretation: no evidence of acute ischemia  ____________________________________________  RADIOLOGY  Chest x-ray reviewed by me with chronic metastatic disease although no acute change noted ____________________________________________   PROCEDURES  Procedure(s) performed: o  Procedures  Critical Care performed: no  Observation: no ____________________________________________   INITIAL IMPRESSION / ASSESSMENT AND PLAN / ED COURSE  Pertinent labs & imaging results that were available during my care of the patient were reviewed by me and considered in my medical decision making (see chart for details).  The patient arrives with an exacerbation of her chronic left-sided chest pain.  Chest x-ray is fortunately reassuring with no evidence of pneumothorax.  EKG is nonischemic and blood work is reassuring.  A lengthy discussion with the patient and family at bedside regarding the diagnostic uncertainty but that at this point I did not believe the patient required inpatient hospitalization.  They verbalized understanding agreement the plan.  Patient is discharged home in stable condition.       ____________________________________________   FINAL CLINICAL IMPRESSION(S) / ED DIAGNOSES  Final diagnoses:  Atypical chest pain      NEW MEDICATIONS STARTED DURING THIS VISIT:  Discharge Medication List as of 08/19/2017  2:45 PM       Note:  This document was prepared using Dragon voice recognition software and may include unintentional dictation errors.     Darel Hong, MD 08/22/17 2134

## 2017-08-19 NOTE — ED Notes (Signed)
Pt's family reports 2 weeks ago dx with lung cancer. Daughter reports this morning was getting her mom out of bed when she c/o sudden worsening L chest pain. Pt resting in bed with NAD noted at this time. When asked questions pt is intermittently responsive to questions.

## 2017-08-19 NOTE — Discharge Instructions (Signed)
Fortunately today your chest x-ray, your blood work, and your EKG were reassuring.  Please follow-up with both your primary care physician and your oncologist as scheduled and return to the emergency department for any concerns.  It was a pleasure to take care of you today, and thank you for coming to our emergency department.  If you have any questions or concerns before leaving please ask the nurse to grab me and I'm more than happy to go through your aftercare instructions again.  If you were prescribed any opioid pain medication today such as Norco, Vicodin, Percocet, morphine, hydrocodone, or oxycodone please make sure you do not drive when you are taking this medication as it can alter your ability to drive safely.  If you have any concerns once you are home that you are not improving or are in fact getting worse before you can make it to your follow-up appointment, please do not hesitate to call 911 and come back for further evaluation.  Darel Hong, MD  Results for orders placed or performed during the hospital encounter of 08/19/17  CBC  Result Value Ref Range   WBC 3.7 3.6 - 11.0 K/uL   RBC 3.86 3.80 - 5.20 MIL/uL   Hemoglobin 11.2 (L) 12.0 - 16.0 g/dL   HCT 35.0 35.0 - 47.0 %   MCV 90.7 80.0 - 100.0 fL   MCH 29.1 26.0 - 34.0 pg   MCHC 32.1 32.0 - 36.0 g/dL   RDW 17.2 (H) 11.5 - 14.5 %   Platelets 308 150 - 440 K/uL  Troponin I  Result Value Ref Range   Troponin I <0.03 <0.03 ng/mL  Basic metabolic panel  Result Value Ref Range   Sodium 139 135 - 145 mmol/L   Potassium 3.4 (L) 3.5 - 5.1 mmol/L   Chloride 103 101 - 111 mmol/L   CO2 28 22 - 32 mmol/L   Glucose, Bld 102 (H) 65 - 99 mg/dL   BUN 24 (H) 6 - 20 mg/dL   Creatinine, Ser 1.10 (H) 0.44 - 1.00 mg/dL   Calcium 9.5 8.9 - 10.3 mg/dL   GFR calc non Af Amer 42 (L) >60 mL/min   GFR calc Af Amer 49 (L) >60 mL/min   Anion gap 8 5 - 15   Dg Chest 2 View  Result Date: 08/19/2017 CLINICAL DATA:  Persistent upper chest  pain EXAM: CHEST - 2 VIEW COMPARISON:  07/30/2017 FINDINGS: Chronic cardiomegaly and aortic tortuosity. Bilateral pulmonary and right-sided pleural nodules that are underestimated relative to recent CT. No superimposed edema or pneumonia suspected. IMPRESSION: Known metastatic disease in the chest that is under appreciated relative to recent PET-CT. No acute superimposed finding. Electronically Signed   By: Monte Fantasia M.D.   On: 08/19/2017 14:17   Dg Chest 2 View  Result Date: 07/30/2017 CLINICAL DATA:  Patient poor historian at this time. Reports to this tech, pain across the front of her upper chest. Patient reports to triage RN: Pt reports today she began having rt sided rib/chest pain. Reports pain with movement and breathing. Pt very vague about pain. Pt told her daughter that she was hurting in her flank. hx HTN, left breast mass, asthma, coronary stent placement. Non-smoker EXAM: CHEST - 2 VIEW COMPARISON:  04/29/2017 FINDINGS: Cardiac silhouette is borderline enlarged. Aorta is enlarged. No mediastinal or hilar masses. There is opacity at the right lung base silhouetting the right hemidiaphragm. There is small areas of hazy opacity in the mid lungs. No pleural effusion.  No pneumothorax. Skeletal structures are demineralized but grossly intact. IMPRESSION: 1. Right lung base opacity consistent with pneumonia. Smaller subtle areas hazy opacity in the perihilar regions may reflect additional areas of infection. No evidence of pulmonary edema. Electronically Signed   By: Lajean Manes M.D.   On: 07/30/2017 16:39   Ct Angio Chest Pe W And/or Wo Contrast  Result Date: 07/30/2017 CLINICAL DATA:  Pt reports today she began having rt sided rib/chest pain. Reports pain with movement and breathing. Pt very vague about pain. Pt told her daughter that she was hurting in her flank EXAM: CT ANGIOGRAPHY CHEST WITH CONTRAST TECHNIQUE: Multidetector CT imaging of the chest was performed using the standard protocol  during bolus administration of intravenous contrast. Multiplanar CT image reconstructions and MIPs were obtained to evaluate the vascular anatomy. CONTRAST:  82mL OMNIPAQUE IOHEXOL 350 MG/ML SOLN COMPARISON:  Current chest radiograph FINDINGS: Cardiovascular: Satisfactory opacification of the pulmonary arteries to the segmental level. No evidence of pulmonary embolism. Heart is mildly enlarged. There are dense coronary artery calcifications. Aorta is dilated to 4.1 cm along its ascending portion. There are aortic atherosclerotic changes. Mediastinum/Nodes: No neck base or axillary masses or enlarged lymph nodes. Ill-defined soft tissue surrounds the left hilar structures. This is most evident along the anterior superior aspect of the left hilum. No discrete enlarged mediastinal lymph nodes. No right hilar masses or adenopathy. Trachea is patent. Esophagus is unremarkable. Lungs/Pleura: Moderate size subpulmonic right pleural effusion. Small left pleural effusion. There are multiple lung nodules. Several reference measurements were made. Largest lies in the left lower lobe along the oblique fissure, image 38, series 6, measuring 23 x 12 mm. Just above and anterior to this in the left upper lobe is an irregular 12 mm nodule. In the left lower lobe, image 48, series 6, there is a nodule measuring 17 x 14 mm. Anterior left upper lobe nodule, image 41, measuring 12 mm. Right upper lobe irregular nodule, image 40, series 6, 12 mm. Nodule at the right lung base, right lower lobe, image 60, 12 mm. The abnormal soft tissue surrounding the hilar structures on the left, mostly surrounding the left upper lobe bronchus, measures approximately 2.8 x 2.4 x 3.0 cm. Abnormal soft tissue occludes the left upper lobe bronchus. Pleural-based opacities are noted most evident on the right posteriorly in the upper hemithorax. No pneumothorax.  No convincing pneumonia.  No pulmonary edema. Upper Abdomen: There multiple low-attenuation  liver masses consistent with metastatic disease. Largest lies along the inferior margin of the left lobe measuring 2.6 cm. Adrenal glands are thickened and not well-defined. Prominent peri celiac lymph nodes are noted. Musculoskeletal: No fracture or acute finding. No osteoblastic or osteolytic lesions. There is a circumscribed relatively dense mass along the deep inferior aspect of the left breast measuring 18 mm. This is likely a benign lesion such as a fibroadenoma. Review of the MIP images confirms the above findings. IMPRESSION: 1. Findings of metastatic disease with multiple lung nodules, pleural-based opacities and a central mass surrounding the anterior superior left hilar structures. Suspect that the hilar mass is a primary bronchogenic carcinoma. The left upper lobe bronchus is occluded, which may be from tumor or mucous plugging. There are also multiple liver masses consistent with metastatic disease. Possible adrenal metastatic disease. 2. Moderate subpulmonic right pleural effusion. Small left pleural effusion. 3. No evidence of pneumonia or pulmonary edema. 4. Dilation of the thoracic aorta, most prominently the ascending portion to 4.1 cm. Recommend annual imaging followup  by CTA or MRA. This recommendation follows 2010 ACCF/AHA/AATS/ACR/ASA/SCA/SCAI/SIR/STS/SVM Guidelines for the Diagnosis and Management of Patients with Thoracic Aortic Disease. Circulation. 2010; 121: [id number] 5. Circumscribed relatively dense mass along the deep inferior aspect of the left breast measuring 18 mm. This is likely a benign lesion such as a fibroadenoma. 6. Cardiomegaly with dense coronary artery calcifications. Aortic aneurysm NOS (ICD10-I71.9). Electronically Signed   By: Lajean Manes M.D.   On: 07/30/2017 18:32   Mr Jeri Cos IO Contrast  Result Date: 08/18/2017 CLINICAL DATA:  History of uterine carcinoma and lung carcinoma with metastases. No neurologic symptoms are reported. EXAM: MRI HEAD WITHOUT AND WITH  CONTRAST TECHNIQUE: Multiplanar, multiecho pulse sequences of the brain and surrounding structures were obtained without and with intravenous contrast. CONTRAST:  72mL MULTIHANCE GADOBENATE DIMEGLUMINE 529 MG/ML IV SOLN COMPARISON:  PET scan 08/08/2017. CT head 05/17/2016. FINDINGS: Brain: No evidence for acute infarction, hemorrhage, mass lesion, hydrocephalus, or extra-axial fluid. Generalized atrophy. Chronic microvascular ischemic change of a mild-to-moderate nature. Post infusion, no abnormal enhancement of the brain or meninges. Vascular: Dolichoectasia.  Visible vessels are patent. Skull and upper cervical spine: No visible calvarial or skull base lesion. Cervical spondylosis. Mild pannus surrounds the odontoid. Sinuses/Orbits: Non worrisome fluid within the RIGHT posterior ethmoid air cell. Other: None. IMPRESSION: Atrophy and chronic microvascular ischemic change. No evidence for intracranial metastatic disease. Electronically Signed   By: Staci Righter M.D.   On: 08/18/2017 14:35   Nm Pet Image Restag (ps) Skull Base To Thigh  Result Date: 08/08/2017 CLINICAL DATA:  Initial treatment strategy for lung mass with metastases. Patient has a history of endometrial cancer. EXAM: NUCLEAR MEDICINE PET SKULL BASE TO THIGH TECHNIQUE: 6.1 mCi F-18 FDG was injected intravenously. Full-ring PET imaging was performed from the skull base to thigh after the radiotracer. CT data was obtained and used for attenuation correction and anatomic localization. Fasting blood glucose: 76 mg/dl COMPARISON:  Chest CT 07/30/2017 FINDINGS: Mediastinal blood pool activity: SUV max 1.6 NECK: No hypermetabolic lymph nodes in the neck. 2.5 cm right thyroid nodule is hypermetabolic with SUV max = 9.6. Incidental CT findings: none CHEST: The dominant anterior left hilar mass is markedly hypermetabolic with SUV max = 97.3. Hypermetabolic mediastinal and bilateral hilar lymphadenopathy is associated. 15 mm nodule posterior left lung (image  75/series 3) demonstrates SUV max = 6.3. Other bilateral pulmonary nodules are hypermetabolic. Hypermetabolic pleural lesions are identified more on the right than the left. There is diffuse pleural hypermetabolism posteriorly in the right costophrenic sulcus. Incidental CT findings: Heart is enlarged. Coronary artery calcification is evident. Atherosclerotic calcification is noted in the wall of the thoracic aorta. Small left pleural effusion associated with subpulmonic small right pleural effusion. ABDOMEN/PELVIS: Hypermetabolic liver lesions are identified including 3.5 cm posterior left liver lesion (130/3) with SUV max = 11.4. Hypermetabolic retroperitoneal lymphadenopathy is evident in the upper abdomen. 13 mm short axis lymph node posterior to the right renal artery demonstrates SUV max = 8.4 (128/3). Bilateral adrenal thickening is evident although no definite adrenal hypermetabolism. 15 mm short axis left para-aortic lymph node (135/3) has SUV max = 7.5. 13 mm right common iliac lymph node (156/3) demonstrates SUV max = 8.1. Hypermetabolic lymphadenopathy evident along the right pelvic sidewall. Subtle 2.5 cm lesion in the medial cortex of the interpolar right kidney is hypermetabolic with SUV max = 53.2. Incidental CT findings: Mild to moderate right hydroureteronephrosis is evident. Right ureter is distended down to the level of the right pelvic sidewall  although etiology for the urinary obstruction is not evident. The uterus appears enlarged and heterogeneous but shows no hypermetabolic FDG accumulation. Layering tiny gallstones evident. Small volume intraperitoneal free fluid noted in the abdomen and pelvis with diffuse mesenteric and body wall edema. Mild circumferential bladder wall thickening is evident. Densely calcified structures in the central mesentery may represent lymph nodes. SKELETON: No focal hypermetabolic activity to suggest skeletal metastasis. Incidental CT findings: Bones are diffusely  demineralized. IMPRESSION: 1. Diffuse hypermetabolic metastatic disease in the chest, abdomen, and pelvis. There is a large left hilar mass that may represent primary bronchogenic neoplasm. 2. Bilateral hypermetabolic pulmonary nodules with relatively diffuse hypermetabolic right pleural disease. 3. Large hypermetabolic liver metastases with hypermetabolic lymphadenopathy in the upper abdomen and retroperitoneal space extending down into the right pelvic sidewall. 4. Hypermetabolic 2.4 cm lesion in the medial left kidney. This may represent a metastatic deposit or potentially primary renal neoplasm. 5. Mild to moderate right hydroureteronephrosis down to the level of the right pelvic sidewall. Distal right ureter extending into the bladder is nondilated. As such, etiology of this obstruction is probably metastatic disease along the right pelvic sidewall. 6. Hypermetabolic right thyroid lesion. 7. Uterus appears enlarged and heterogeneous, but demonstrates no hypermetabolic activity. 8.  Aortic Atherosclerois (ICD10-170.0) 9. Small umbilical hernia contains small volume fluid. This is associated with small volume intraperitoneal free fluid. Electronically Signed   By: Misty Stanley M.D.   On: 08/08/2017 12:04

## 2017-08-22 ENCOUNTER — Telehealth: Payer: Self-pay

## 2017-08-22 ENCOUNTER — Telehealth: Payer: Self-pay | Admitting: *Deleted

## 2017-08-22 NOTE — Telephone Encounter (Signed)
Phone call made to pt's daughter, [name], to inform of biopsy scheduled on Tues 5/7 at 11am, [facility]. Informed of follow up appt with Dr. Janese Banks on Tues 5/14 at 3:15pm to discuss results and treatment options. Pt's daughter verbalized understanding. appts have been mailed per daughter's request.

## 2017-08-22 NOTE — Telephone Encounter (Signed)
Contacted Ms. Sellar daugther [name]) to inform her about her mother appointment schedule for biopsy on May 7/ 2019 @ 10:00 [facility] at the admitting desk appointment time 11:00AM.The patient daugther was informed NPO after mid night( nothing to eat or drink ), must come with driver. If she need to take any medication  with a sip of water only. Ms. [name] was understanding and agreed with information given to her.

## 2017-08-25 ENCOUNTER — Other Ambulatory Visit: Payer: Self-pay | Admitting: *Deleted

## 2017-08-25 ENCOUNTER — Ambulatory Visit: Payer: Medicare Other | Admitting: Oncology

## 2017-08-25 MED ORDER — OXYCODONE HCL 5 MG PO TABS: 5.0000 mg | ORAL_TABLET | ORAL | 0 refills | Status: AC | PRN

## 2017-08-25 NOTE — Telephone Encounter (Signed)
Daughter called answering service to report that patient is in pain and has no pain medicine.She last had Oxycodone #8 tabs on 07/30/17 given after ED visit. Please advise

## 2017-08-25 NOTE — Telephone Encounter (Signed)
Please clarify where the pain is. I am ok to give her oxycodone 5 mg Q4 prn for pain 60 tab no refills. Monitor for sedation and use cautiously.

## 2017-08-25 NOTE — Telephone Encounter (Signed)
Daughter asked patient while on phone and she states that the pain is in the left upper chest where she has been having pain. Daughter states patient is grunting a lot and she asks her what is wrong and she says she is hurting.

## 2017-08-28 ENCOUNTER — Other Ambulatory Visit: Payer: Self-pay | Admitting: Radiology

## 2017-08-29 ENCOUNTER — Ambulatory Visit
Admission: RE | Admit: 2017-08-29 | Discharge: 2017-08-29 | Disposition: A | Discharge status: Home / Self Care | Payer: Medicare Other | Source: Ambulatory Visit | Attending: Oncology | Admitting: Oncology

## 2017-08-29 DIAGNOSIS — Z6820 Body mass index (BMI) 20.0-20.9, adult: Secondary | ICD-10-CM | POA: Diagnosis not present

## 2017-08-29 DIAGNOSIS — Z955 Presence of coronary angioplasty implant and graft: Secondary | ICD-10-CM | POA: Diagnosis not present

## 2017-08-29 DIAGNOSIS — I10 Essential (primary) hypertension: Secondary | ICD-10-CM | POA: Diagnosis not present

## 2017-08-29 DIAGNOSIS — Z7982 Long term (current) use of aspirin: Secondary | ICD-10-CM | POA: Insufficient documentation

## 2017-08-29 DIAGNOSIS — Z79899 Other long term (current) drug therapy: Secondary | ICD-10-CM | POA: Diagnosis not present

## 2017-08-29 DIAGNOSIS — Z90710 Acquired absence of both cervix and uterus: Secondary | ICD-10-CM | POA: Insufficient documentation

## 2017-08-29 DIAGNOSIS — Z8542 Personal history of malignant neoplasm of other parts of uterus: Secondary | ICD-10-CM | POA: Diagnosis not present

## 2017-08-29 DIAGNOSIS — C787 Secondary malignant neoplasm of liver and intrahepatic bile duct: Secondary | ICD-10-CM | POA: Diagnosis not present

## 2017-08-29 DIAGNOSIS — R918 Other nonspecific abnormal finding of lung field: Secondary | ICD-10-CM

## 2017-08-29 DIAGNOSIS — Z87440 Personal history of urinary (tract) infections: Secondary | ICD-10-CM | POA: Insufficient documentation

## 2017-08-29 DIAGNOSIS — F0390 Unspecified dementia without behavioral disturbance: Secondary | ICD-10-CM | POA: Insufficient documentation

## 2017-08-29 DIAGNOSIS — Z993 Dependence on wheelchair: Secondary | ICD-10-CM | POA: Diagnosis not present

## 2017-08-29 DIAGNOSIS — I2510 Atherosclerotic heart disease of native coronary artery without angina pectoris: Secondary | ICD-10-CM | POA: Diagnosis not present

## 2017-08-29 DIAGNOSIS — Z880 Allergy status to penicillin: Secondary | ICD-10-CM | POA: Diagnosis not present

## 2017-08-29 DIAGNOSIS — R627 Adult failure to thrive: Secondary | ICD-10-CM | POA: Insufficient documentation

## 2017-08-29 DIAGNOSIS — G4730 Sleep apnea, unspecified: Secondary | ICD-10-CM | POA: Diagnosis not present

## 2017-08-29 DIAGNOSIS — K769 Liver disease, unspecified: Secondary | ICD-10-CM | POA: Diagnosis present

## 2017-08-29 DIAGNOSIS — R160 Hepatomegaly, not elsewhere classified: Secondary | ICD-10-CM

## 2017-08-29 LAB — CBC
HCT: 31.6 % — ABNORMAL LOW (ref 35.0–47.0)
Hemoglobin: 10.3 g/dL — ABNORMAL LOW (ref 12.0–16.0)
MCH: 29.7 pg (ref 26.0–34.0)
MCHC: 32.6 g/dL (ref 32.0–36.0)
MCV: 90.9 fL (ref 80.0–100.0)
PLATELETS: 261 10*3/uL (ref 150–440)
RBC: 3.48 MIL/uL — ABNORMAL LOW (ref 3.80–5.20)
RDW: 17.5 % — AB (ref 11.5–14.5)
WBC: 4.6 10*3/uL (ref 3.6–11.0)

## 2017-08-29 LAB — PROTIME-INR
INR: 1.08
Prothrombin Time: 13.9 seconds (ref 11.4–15.2)

## 2017-08-29 MED ORDER — FENTANYL CITRATE (PF) 100 MCG/2ML IJ SOLN
INTRAMUSCULAR | Status: AC
  Filled 2017-08-29: qty 2

## 2017-08-29 MED ORDER — FENTANYL CITRATE (PF) 100 MCG/2ML IJ SOLN
INTRAMUSCULAR | Status: AC | PRN
  Administered 2017-08-29: 12.5 ug via INTRAVENOUS

## 2017-08-29 MED ORDER — MIDAZOLAM HCL 2 MG/2ML IJ SOLN
INTRAMUSCULAR | Status: AC
  Filled 2017-08-29: qty 2

## 2017-08-29 MED ORDER — SODIUM CHLORIDE 0.9 % IV SOLN: INTRAVENOUS | Status: DC

## 2017-08-29 NOTE — Sedation Documentation (Signed)
Total sedation: Fentanyl 12.5 mcg IV. Pt. Tolerated procedure well. Remains "sleepy," responding to verbal stimuli. Remains primarily oriented to person.

## 2017-08-29 NOTE — H&P (Signed)
Chief Complaint: Patient was seen in consultation today for liver lesion biopsy at the request of Rao,Archana C  Referring Physician(s): Rao,Archana C  Patient Status: [facility] - Out-pt  History of Present Illness: Sara Heath is a 84 y.o. female with a previous history of endometrial carcinoma presenting with bilateral lung nodules, left hilar mass, right pleural masses and multiple liver lesions. Some right chest pain and flank pain.  Failure to thrive. Wheelchair bound.  Past Medical History:  Diagnosis Date  . Angioedema   . Asthma   . Breast mass, left   . CAD (coronary artery disease)   . Chronic back pain   . Chronic headache   . Dementia   . Dementia   . Edema of both legs   . Endometrial cancer ([place])   . Gout   . Hematuria   . Hemorrhagic cystitis   . Hypertension   . Malaise and fatigue   . Sleep apnea   . Torticollis   . UTI (lower urinary tract infection)   . Vaginal candidiasis   . Vulvar dystrophy   . Weight loss     Past Surgical History:  Procedure Laterality Date  . ABDOMINAL HYSTERECTOMY    . CORONARY ANGIOPLASTY WITH STENT PLACEMENT    . left breast biopsy    . tubal ligation      Allergies: Penicillins  Medications: Prior to Admission medications   Medication Sig Start Date End Date Taking? Authorizing Provider  amLODipine (NORVASC) 5 MG tablet Take 1 tablet (5 mg total) by mouth daily. 05/19/16  Yes Fritzi Mandes, MD  aspirin EC 81 MG tablet Take 81 mg by mouth daily.    Yes [provider]  cetirizine (ZYRTEC) 10 MG tablet Take 10 mg by mouth daily as needed for allergies.  11/26/14  Yes [provider]  furosemide (LASIX) 40 MG tablet Take 40 mg by mouth daily.    Yes [provider]  isosorbide mononitrate (IMDUR) 120 MG 24 hr tablet Take 120 mg by mouth daily.  11/13/14  Yes [provider]  oxyCODONE (OXY IR/ROXICODONE) 5 MG immediate release tablet Take 1 tablet (5 mg total) by mouth every 4 (four)  hours as needed for severe pain. 08/25/17  Yes Sindy Guadeloupe, MD  RA COL-RITE 100 MG capsule take 100 mg by mouth twice a day if needed for STOOL SOFTENING 10/01/14  Yes [provider]  nitroGLYCERIN (NITROSTAT) 0.4 MG SL tablet Place 0.4 mg under the tongue every 5 (five) minutes x 3 doses as needed for chest pain. *if not resolved , go to emergency department.*    [provider]  sulfamethoxazole-trimethoprim (BACTRIM DS,SEPTRA DS) 800-160 MG tablet Take 1 tablet by mouth 2 (two) times daily. Patient not taking: Reported on 08/29/2017 08/16/17   Sindy Guadeloupe, MD     Family History  Family history unknown: Yes    Social History   Socioeconomic History  . Marital status: Widowed    Spouse name: Not on file  . Number of children: Not on file  . Years of education: Not on file  . Highest education level: Not on file  Occupational History  . Not on file  Social Needs  . Financial resource strain: Not on file  . Food insecurity:    Worry: Not on file    Inability: Not on file  . Transportation needs:    Medical: Not on file    Non-medical: Not on file  Tobacco Use  .  Smoking status: Never Smoker  . Smokeless tobacco: Never Used  Substance and Sexual Activity  . Alcohol use: No  . Drug use: No  . Sexual activity: Not on file  Lifestyle  . Physical activity:    Days per week: Not on file    Minutes per session: Not on file  . Stress: Not on file  Relationships  . Social connections:    Talks on phone: Not on file    Gets together: Not on file    Attends religious service: Not on file    Active member of club or organization: Not on file    Attends meetings of clubs or organizations: Not on file    Relationship status: Not on file  Other Topics Concern  . Not on file  Social History Narrative  . Not on file    ECOG Status: 3 - Symptomatic, >50% confined to bed  Review of Systems: A 12 point ROS discussed and pertinent positives are indicated in the  HPI above.  All other systems are negative.  Review of Systems  Constitutional: Positive for activity change, fatigue and unexpected weight change.  HENT: Negative.   Cardiovascular: Positive for chest pain and leg swelling.  Gastrointestinal: Negative.   Genitourinary: Positive for flank pain.  Neurological: Negative.     Vital Signs: BP 127/86   Pulse 80   Temp 97.7 F (36.5 C) (Oral)   Resp 17   Ht 5\' 2"  (1.575 m)   Wt 114 lb (51.7 kg)   SpO2 98%   BMI 20.85 kg/m   Physical Exam  Constitutional:  Cachectic, frail.  Neck: Neck supple. No JVD present. No thyromegaly present.  Cardiovascular: Normal rate, regular rhythm and normal heart sounds. Exam reveals no gallop and no friction rub.  No murmur heard. Pulmonary/Chest: Effort normal and breath sounds normal. No stridor. No respiratory distress. She has no wheezes. She has no rales.  Abdominal: Soft. Bowel sounds are normal. She exhibits no distension and no mass. There is no tenderness. There is no rebound and no guarding.  Musculoskeletal: She exhibits edema.  Edema of right lower leg and ankle.  Lymphadenopathy:    She has no cervical adenopathy.  Neurological:  Arousable but obtunded.  Skin: Skin is warm and dry.  Vitals reviewed.   Imaging: Dg Chest 2 View  Result Date: 08/19/2017 CLINICAL DATA:  Persistent upper chest pain EXAM: CHEST - 2 VIEW COMPARISON:  07/30/2017 FINDINGS: Chronic cardiomegaly and aortic tortuosity. Bilateral pulmonary and right-sided pleural nodules that are underestimated relative to recent CT. No superimposed edema or pneumonia suspected. IMPRESSION: Known metastatic disease in the chest that is under appreciated relative to recent PET-CT. No acute superimposed finding. Electronically Signed   By: Monte Fantasia M.D.   On: 08/19/2017 14:17   Dg Chest 2 View  Result Date: 07/30/2017 CLINICAL DATA:  Patient poor historian at this time. Reports to this tech, pain across the front of her  upper chest. Patient reports to triage RN: Pt reports today she began having rt sided rib/chest pain. Reports pain with movement and breathing. Pt very vague about pain. Pt told her daughter that she was hurting in her flank. hx HTN, left breast mass, asthma, coronary stent placement. Non-smoker EXAM: CHEST - 2 VIEW COMPARISON:  04/29/2017 FINDINGS: Cardiac silhouette is borderline enlarged. Aorta is enlarged. No mediastinal or hilar masses. There is opacity at the right lung base silhouetting the right hemidiaphragm. There is small areas of hazy opacity in the  mid lungs. No pleural effusion.  No pneumothorax. Skeletal structures are demineralized but grossly intact. IMPRESSION: 1. Right lung base opacity consistent with pneumonia. Smaller subtle areas hazy opacity in the perihilar regions may reflect additional areas of infection. No evidence of pulmonary edema. Electronically Signed   By: Lajean Manes M.D.   On: 07/30/2017 16:39   Ct Angio Chest Pe W And/or Wo Contrast  Result Date: 07/30/2017 CLINICAL DATA:  Pt reports today she began having rt sided rib/chest pain. Reports pain with movement and breathing. Pt very vague about pain. Pt told her daughter that she was hurting in her flank EXAM: CT ANGIOGRAPHY CHEST WITH CONTRAST TECHNIQUE: Multidetector CT imaging of the chest was performed using the standard protocol during bolus administration of intravenous contrast. Multiplanar CT image reconstructions and MIPs were obtained to evaluate the vascular anatomy. CONTRAST:  30mL OMNIPAQUE IOHEXOL 350 MG/ML SOLN COMPARISON:  Current chest radiograph FINDINGS: Cardiovascular: Satisfactory opacification of the pulmonary arteries to the segmental level. No evidence of pulmonary embolism. Heart is mildly enlarged. There are dense coronary artery calcifications. Aorta is dilated to 4.1 cm along its ascending portion. There are aortic atherosclerotic changes. Mediastinum/Nodes: No neck base or axillary masses or  enlarged lymph nodes. Ill-defined soft tissue surrounds the left hilar structures. This is most evident along the anterior superior aspect of the left hilum. No discrete enlarged mediastinal lymph nodes. No right hilar masses or adenopathy. Trachea is patent. Esophagus is unremarkable. Lungs/Pleura: Moderate size subpulmonic right pleural effusion. Small left pleural effusion. There are multiple lung nodules. Several reference measurements were made. Largest lies in the left lower lobe along the oblique fissure, image 38, series 6, measuring 23 x 12 mm. Just above and anterior to this in the left upper lobe is an irregular 12 mm nodule. In the left lower lobe, image 48, series 6, there is a nodule measuring 17 x 14 mm. Anterior left upper lobe nodule, image 41, measuring 12 mm. Right upper lobe irregular nodule, image 40, series 6, 12 mm. Nodule at the right lung base, right lower lobe, image 60, 12 mm. The abnormal soft tissue surrounding the hilar structures on the left, mostly surrounding the left upper lobe bronchus, measures approximately 2.8 x 2.4 x 3.0 cm. Abnormal soft tissue occludes the left upper lobe bronchus. Pleural-based opacities are noted most evident on the right posteriorly in the upper hemithorax. No pneumothorax.  No convincing pneumonia.  No pulmonary edema. Upper Abdomen: There multiple low-attenuation liver masses consistent with metastatic disease. Largest lies along the inferior margin of the left lobe measuring 2.6 cm. Adrenal glands are thickened and not well-defined. Prominent peri celiac lymph nodes are noted. Musculoskeletal: No fracture or acute finding. No osteoblastic or osteolytic lesions. There is a circumscribed relatively dense mass along the deep inferior aspect of the left breast measuring 18 mm. This is likely a benign lesion such as a fibroadenoma. Review of the MIP images confirms the above findings. IMPRESSION: 1. Findings of metastatic disease with multiple lung nodules,  pleural-based opacities and a central mass surrounding the anterior superior left hilar structures. Suspect that the hilar mass is a primary bronchogenic carcinoma. The left upper lobe bronchus is occluded, which may be from tumor or mucous plugging. There are also multiple liver masses consistent with metastatic disease. Possible adrenal metastatic disease. 2. Moderate subpulmonic right pleural effusion. Small left pleural effusion. 3. No evidence of pneumonia or pulmonary edema. 4. Dilation of the thoracic aorta, most prominently the ascending portion to  4.1 cm. Recommend annual imaging followup by CTA or MRA. This recommendation follows 2010 ACCF/AHA/AATS/ACR/ASA/SCA/SCAI/SIR/STS/SVM Guidelines for the Diagnosis and Management of Patients with Thoracic Aortic Disease. Circulation. 2010; 121: e266-e369 5. Circumscribed relatively dense mass along the deep inferior aspect of the left breast measuring 18 mm. This is likely a benign lesion such as a fibroadenoma. 6. Cardiomegaly with dense coronary artery calcifications. Aortic aneurysm NOS (ICD10-I71.9). Electronically Signed   By: Lajean Manes M.D.   On: 07/30/2017 18:32   Mr Jeri Cos SE Contrast  Result Date: 08/18/2017 CLINICAL DATA:  History of uterine carcinoma and lung carcinoma with metastases. No neurologic symptoms are reported. EXAM: MRI HEAD WITHOUT AND WITH CONTRAST TECHNIQUE: Multiplanar, multiecho pulse sequences of the brain and surrounding structures were obtained without and with intravenous contrast. CONTRAST:  12mL MULTIHANCE GADOBENATE DIMEGLUMINE 529 MG/ML IV SOLN COMPARISON:  PET scan 08/08/2017. CT head 05/17/2016. FINDINGS: Brain: No evidence for acute infarction, hemorrhage, mass lesion, hydrocephalus, or extra-axial fluid. Generalized atrophy. Chronic microvascular ischemic change of a mild-to-moderate nature. Post infusion, no abnormal enhancement of the brain or meninges. Vascular: Dolichoectasia.  Visible vessels are patent. Skull  and upper cervical spine: No visible calvarial or skull base lesion. Cervical spondylosis. Mild pannus surrounds the odontoid. Sinuses/Orbits: Non worrisome fluid within the RIGHT posterior ethmoid air cell. Other: None. IMPRESSION: Atrophy and chronic microvascular ischemic change. No evidence for intracranial metastatic disease. Electronically Signed   By: Staci Righter M.D.   On: 08/18/2017 14:35   Nm Pet Image Restag (ps) Skull Base To Thigh  Result Date: 08/08/2017 CLINICAL DATA:  Initial treatment strategy for lung mass with metastases. Patient has a history of endometrial cancer. EXAM: NUCLEAR MEDICINE PET SKULL BASE TO THIGH TECHNIQUE: 6.1 mCi F-18 FDG was injected intravenously. Full-ring PET imaging was performed from the skull base to thigh after the radiotracer. CT data was obtained and used for attenuation correction and anatomic localization. Fasting blood glucose: 76 mg/dl COMPARISON:  Chest CT 07/30/2017 FINDINGS: Mediastinal blood pool activity: SUV max 1.6 NECK: No hypermetabolic lymph nodes in the neck. 2.5 cm right thyroid nodule is hypermetabolic with SUV max = 9.6. Incidental CT findings: none CHEST: The dominant anterior left hilar mass is markedly hypermetabolic with SUV max = 83.1. Hypermetabolic mediastinal and bilateral hilar lymphadenopathy is associated. 15 mm nodule posterior left lung (image 75/series 3) demonstrates SUV max = 6.3. Other bilateral pulmonary nodules are hypermetabolic. Hypermetabolic pleural lesions are identified more on the right than the left. There is diffuse pleural hypermetabolism posteriorly in the right costophrenic sulcus. Incidental CT findings: Heart is enlarged. Coronary artery calcification is evident. Atherosclerotic calcification is noted in the wall of the thoracic aorta. Small left pleural effusion associated with subpulmonic small right pleural effusion. ABDOMEN/PELVIS: Hypermetabolic liver lesions are identified including 3.5 cm posterior left  liver lesion (130/3) with SUV max = 11.4. Hypermetabolic retroperitoneal lymphadenopathy is evident in the upper abdomen. 13 mm short axis lymph node posterior to the right renal artery demonstrates SUV max = 8.4 (128/3). Bilateral adrenal thickening is evident although no definite adrenal hypermetabolism. 15 mm short axis left para-aortic lymph node (135/3) has SUV max = 7.5. 13 mm right common iliac lymph node (156/3) demonstrates SUV max = 8.1. Hypermetabolic lymphadenopathy evident along the right pelvic sidewall. Subtle 2.5 cm lesion in the medial cortex of the interpolar right kidney is hypermetabolic with SUV max = 51.7. Incidental CT findings: Mild to moderate right hydroureteronephrosis is evident. Right ureter is distended down to the  level of the right pelvic sidewall although etiology for the urinary obstruction is not evident. The uterus appears enlarged and heterogeneous but shows no hypermetabolic FDG accumulation. Layering tiny gallstones evident. Small volume intraperitoneal free fluid noted in the abdomen and pelvis with diffuse mesenteric and body wall edema. Mild circumferential bladder wall thickening is evident. Densely calcified structures in the central mesentery may represent lymph nodes. SKELETON: No focal hypermetabolic activity to suggest skeletal metastasis. Incidental CT findings: Bones are diffusely demineralized. IMPRESSION: 1. Diffuse hypermetabolic metastatic disease in the chest, abdomen, and pelvis. There is a large left hilar mass that may represent primary bronchogenic neoplasm. 2. Bilateral hypermetabolic pulmonary nodules with relatively diffuse hypermetabolic right pleural disease. 3. Large hypermetabolic liver metastases with hypermetabolic lymphadenopathy in the upper abdomen and retroperitoneal space extending down into the right pelvic sidewall. 4. Hypermetabolic 2.4 cm lesion in the medial left kidney. This may represent a metastatic deposit or potentially primary renal  neoplasm. 5. Mild to moderate right hydroureteronephrosis down to the level of the right pelvic sidewall. Distal right ureter extending into the bladder is nondilated. As such, etiology of this obstruction is probably metastatic disease along the right pelvic sidewall. 6. Hypermetabolic right thyroid lesion. 7. Uterus appears enlarged and heterogeneous, but demonstrates no hypermetabolic activity. 8.  Aortic Atherosclerois (ICD10-170.0) 9. Small umbilical hernia contains small volume fluid. This is associated with small volume intraperitoneal free fluid. Electronically Signed   By: Misty Stanley M.D.   On: 08/08/2017 12:04    Labs:  CBC: Recent Labs    07/30/17 1613 08/16/17 0905 08/19/17 1155 08/29/17 1031  WBC 5.2 5.3 3.7 4.6  HGB 11.2* 10.1* 11.2* 10.3*  HCT 34.5* 31.0* 35.0 31.6*  PLT 324 283 308 261    COAGS: Recent Labs    07/30/17 1922 08/29/17 1031  INR 1.09 1.08  APTT 33  --     BMP: Recent Labs    04/29/17 1625 07/30/17 1613 08/16/17 0905 08/19/17 1309  NA 140 134* 138 139  K 4.0 3.6 3.8 3.4*  CL 106 102 104 103  CO2 27 25 25 28   GLUCOSE 100* 103* 99 102*  BUN 17 15 19  24*  CALCIUM 9.9 9.6 9.6 9.5  CREATININE 0.95 0.63 0.80 1.10*  GFRNONAA 50* >60 >60 42*  GFRAA 58* >60 >60 49*    LIVER FUNCTION TESTS: Recent Labs    07/30/17 1922 08/16/17 0905 08/19/17 1155  BILITOT 0.6 0.6 0.5  AST 23 19 21   ALT 11* 10* 11*  ALKPHOS 44 44 43  PROT 7.1 7.0 6.8  ALBUMIN 3.0* 2.9* 2.9*    TUMOR MARKERS: No results for input(s): AFPTM, CEA, CA199, CHROMGRNA in the last 8760 hours.  Assessment and Plan:  Presenting with picture of advanced metastatic malignancy and poor performance status.  For US guided liver lesion biopsy today.  Risks and benefits discussed with the patient and family including, but not limited to bleeding, infection, damage to adjacent structures or low yield requiring additional tests. All of the family's questions were answered, patient is  agreeable to proceed. Consent obtained from daughter, signed and in chart.  Thank you for this interesting consult.  I greatly enjoyed meeting ALEATHA TAITE and look forward to participating in their care.  A copy of this report was sent to the requesting provider on this date.  Electronically Signed: Azzie Roup, MD 08/29/2017, 11:01 AM     I spent a total of 30 Minutes in face to face in clinical consultation,  greater than 50% of which was counseling/coordinating care for liver lesion biopsy.

## 2017-08-29 NOTE — Progress Notes (Signed)
Dr. Kathlene Cote at bedside : spoke with 4 family members re: procedure.All in attendance verbalized understanding. Pt. Remains with Malaise, opening eyes only to verbal stimuli. Pt. Answers questions when asked, yet remains disoriented to immediate surroundings and events except to person. RLQ abdomen without any swelling, ooze, drainage, hematoma. Pt. Taking sips of juices, refusing any food.

## 2017-08-29 NOTE — Procedures (Signed)
Interventional Radiology Procedure Note  Procedure: US guided liver lesion biopsy  Complications: None  Estimated Blood Loss: < 10 mL  Findings: Lesion in left lobe sampled via 17 G needle. 18 G core biopsy x 3.  Gelfoam administered post biopsy.  Venetia Night. Kathlene Cote, M.D Pager:  [phone]

## 2017-08-31 LAB — SURGICAL PATHOLOGY

## 2017-09-05 ENCOUNTER — Inpatient Hospital Stay: Payer: Medicare Other | Attending: Oncology | Admitting: Oncology

## 2017-09-05 ENCOUNTER — Encounter: Payer: Self-pay | Admitting: Oncology

## 2017-09-05 VITALS — BP 114/77 | HR 89 | Temp 97.9°F | Resp 16 | Ht 62.0 in

## 2017-09-05 DIAGNOSIS — F0390 Unspecified dementia without behavioral disturbance: Secondary | ICD-10-CM | POA: Diagnosis not present

## 2017-09-05 DIAGNOSIS — I10 Essential (primary) hypertension: Secondary | ICD-10-CM | POA: Insufficient documentation

## 2017-09-05 DIAGNOSIS — I2510 Atherosclerotic heart disease of native coronary artery without angina pectoris: Secondary | ICD-10-CM | POA: Insufficient documentation

## 2017-09-05 DIAGNOSIS — C541 Malignant neoplasm of endometrium: Secondary | ICD-10-CM | POA: Diagnosis not present

## 2017-09-05 DIAGNOSIS — Z7189 Other specified counseling: Secondary | ICD-10-CM

## 2017-09-05 DIAGNOSIS — C787 Secondary malignant neoplasm of liver and intrahepatic bile duct: Secondary | ICD-10-CM | POA: Diagnosis not present

## 2017-09-05 DIAGNOSIS — C7989 Secondary malignant neoplasm of other specified sites: Secondary | ICD-10-CM | POA: Diagnosis not present

## 2017-09-05 DIAGNOSIS — C779 Secondary and unspecified malignant neoplasm of lymph node, unspecified: Secondary | ICD-10-CM | POA: Insufficient documentation

## 2017-09-05 DIAGNOSIS — C7800 Secondary malignant neoplasm of unspecified lung: Secondary | ICD-10-CM

## 2017-09-05 NOTE — Progress Notes (Signed)
No new changes noted today 

## 2017-09-05 NOTE — Progress Notes (Signed)
Hematology/Oncology Consult note [facility]  [phone] [phone]  Patient Care Team: Petra Kuba, MD as PCP - General (Family Medicine) Telford Nab, RN as Registered Nurse   Name of the patient: Sara Heath  [id number]  1921/12/25   Date of visit: 09/05/17  Diagnosis- metastatic endometrial carcinoma with mets to lung, liver and LN and soft tissue  Chief complaint/ Reason for visit- discuss pathology results and further management  Heme/Onc history:  Patient is a 84 year old female with a past medical significant for hypertension, CAD, endometrial cancer status post radiation and dementia.  Patient lives with her daughter [name] and is essentially wheelchair bound. She speaks very few sentences.  She ER with symptoms of right-sided chest pain and flank pain.  CT chest on 07/30/2017 showed multiple lung nodules, pleural nodules, left hilar central mass as well as multiple liver and renal metastases.  Patient was seen by Dr. Tasia Catchings on 07/31/2017 and a PET/CT scan was ordered at that time.  PET/CT on 08/08/2017 showed diffuse hypermetabolic disease in the chest abdomen.  Large left hilar mass present for neoplasm.  Bilateral hypermetabolic pulmonary nodules and right pleural disease.  Large hypermetabolic liver metastases with hypermetabolic adenopathy in the upper abdomen andextending into right pelvic sidewall.  Hypermetabolic 2.4 cm lesion in the left kidney.  Mild to moderate right hydro-urine hydronephrosis down to the level of the right pelvic sidewall.  Etiology is possible metastatic disease along the right pelvic sidewall  Liver biopsy pathology showed: DIAGNOSIS:  A. LIVER, LEFT LOBE; ULTRASOUND-GUIDED BIOPSY:  - METASTATIC ADENOCARCINOMA CONSISTENT WITH ENDOMETRIAL ORIGIN.   Interval history- Patient unable to provide any history due to dementia. She is here with multiple family members today. She lives with her daughter [name].  Family reports that patient condition continues to decline. She has no appetite and is getting weaker. She appears fatigued but does not appear to be in pain or discomfort  ECOG PS- 3-4 Pain scale- unable to obtain  Review of systems- Review of Systems  Unable to perform ROS: Dementia      Allergies  Allergen Reactions  . Penicillins Anaphylaxis    Has patient had a PCN reaction causing immediate rash, facial/tongue/throat swelling, SOB or lightheadedness with hypotension: Yes Has patient had a PCN reaction causing severe rash involving mucus membranes or skin necrosis: No Has patient had a PCN reaction that required hospitalization No Has patient had a PCN reaction occurring within the last 10 years: No If all of the above answers are "NO", then may proceed with Cephalosporin use.     Past Medical History:  Diagnosis Date  . Angioedema   . Asthma   . Breast mass, left   . CAD (coronary artery disease)   . Chronic back pain   . Chronic headache   . Dementia   . Dementia   . Edema of both legs   . Endometrial cancer ([place])   . Gout   . Hematuria   . Hemorrhagic cystitis   . Hypertension   . Malaise and fatigue   . Sleep apnea   . Torticollis   . UTI (lower urinary tract infection)   . Vaginal candidiasis   . Vulvar dystrophy   . Weight loss      Past Surgical History:  Procedure Laterality Date  . ABDOMINAL HYSTERECTOMY    . CORONARY ANGIOPLASTY WITH STENT PLACEMENT    . left breast biopsy    . tubal ligation  Social History   Socioeconomic History  . Marital status: Widowed    Spouse name: Not on file  . Number of children: Not on file  . Years of education: Not on file  . Highest education level: Not on file  Occupational History  . Not on file  Social Needs  . Financial resource strain: Not on file  . Food insecurity:    Worry: Not on file    Inability: Not on file  . Transportation needs:    Medical: Not on file    Non-medical: Not on file   Tobacco Use  . Smoking status: Never Smoker  . Smokeless tobacco: Never Used  Substance and Sexual Activity  . Alcohol use: No  . Drug use: No  . Sexual activity: Not on file  Lifestyle  . Physical activity:    Days per week: Not on file    Minutes per session: Not on file  . Stress: Not on file  Relationships  . Social connections:    Talks on phone: Not on file    Gets together: Not on file    Attends religious service: Not on file    Active member of club or organization: Not on file    Attends meetings of clubs or organizations: Not on file    Relationship status: Not on file  . Intimate partner violence:    Fear of current or ex partner: Not on file    Emotionally abused: Not on file    Physically abused: Not on file    Forced sexual activity: Not on file  Other Topics Concern  . Not on file  Social History Narrative  . Not on file    Family History  Family history unknown: Yes     Current Outpatient Medications:  .  amLODipine (NORVASC) 5 MG tablet, Take 1 tablet (5 mg total) by mouth daily., Disp: 30 tablet, Rfl: 0 .  aspirin EC 81 MG tablet, Take 81 mg by mouth daily. , Disp: , Rfl:  .  furosemide (LASIX) 40 MG tablet, Take 40 mg by mouth daily. , Disp: , Rfl:  .  isosorbide mononitrate (IMDUR) 120 MG 24 hr tablet, Take 120 mg by mouth daily. , Disp: , Rfl:  .  oxyCODONE (OXY IR/ROXICODONE) 5 MG immediate release tablet, Take 1 tablet (5 mg total) by mouth every 4 (four) hours as needed for severe pain., Disp: 60 tablet, Rfl: 0 .  RA COL-RITE 100 MG capsule, take 100 mg by mouth twice a day if needed for STOOL SOFTENING, Disp: , Rfl: 0 .  cetirizine (ZYRTEC) 10 MG tablet, Take 10 mg by mouth daily as needed for allergies. , Disp: , Rfl:  .  nitroGLYCERIN (NITROSTAT) 0.4 MG SL tablet, Place 0.4 mg under the tongue every 5 (five) minutes x 3 doses as needed for chest pain. *if not resolved , go to emergency department.*, Disp: , Rfl:  .   sulfamethoxazole-trimethoprim (BACTRIM DS,SEPTRA DS) 800-160 MG tablet, Take 1 tablet by mouth 2 (two) times daily. (Patient not taking: Reported on 08/29/2017), Disp: 14 tablet, Rfl: 0  Physical exam:  Vitals:   09/05/17 1349  BP: 114/77  Pulse: 89  Resp: 16  Temp: 97.9 F (36.6 C)  TempSrc: Tympanic  Height: 5\' 2"  (1.575 m)   Physical Exam  Constitutional:  Frail thin elderly woman sitting in a wheelchair. She appears fatigued  HENT:  Head: Normocephalic and atraumatic.  Eyes: Pupils are equal, round, and reactive to light.  EOM are normal.  Neck: Normal range of motion.  Cardiovascular: Normal rate, regular rhythm and normal heart sounds.  Pulmonary/Chest: Effort normal and breath sounds normal.  Abdominal: Soft. Bowel sounds are normal.  Skin: Skin is warm and dry.     CMP Latest Ref Rng & Units 08/19/2017  Glucose 65 - 99 mg/dL 102(H)  BUN 6 - 20 mg/dL 24(H)  Creatinine 0.44 - 1.00 mg/dL 1.10(H)  Sodium 135 - 145 mmol/L 139  Potassium 3.5 - 5.1 mmol/L 3.4(L)  Chloride 101 - 111 mmol/L 103  CO2 22 - 32 mmol/L 28  Calcium 8.9 - 10.3 mg/dL 9.5  Total Protein 6.5 - 8.1 g/dL 6.8  Total Bilirubin 0.3 - 1.2 mg/dL 0.5  Alkaline Phos 38 - 126 U/L 43  AST 15 - 41 U/L 21  ALT 14 - 54 U/L 11(L)   CBC Latest Ref Rng & Units 08/29/2017  WBC 3.6 - 11.0 K/uL 4.6  Hemoglobin 12.0 - 16.0 g/dL 10.3(L)  Hematocrit 35.0 - 47.0 % 31.6(L)  Platelets 150 - 440 K/uL 261    No images are attached to the encounter.  Dg Chest 2 View  Result Date: 08/19/2017 CLINICAL DATA:  Persistent upper chest pain EXAM: CHEST - 2 VIEW COMPARISON:  07/30/2017 FINDINGS: Chronic cardiomegaly and aortic tortuosity. Bilateral pulmonary and right-sided pleural nodules that are underestimated relative to recent CT. No superimposed edema or pneumonia suspected. IMPRESSION: Known metastatic disease in the chest that is under appreciated relative to recent PET-CT. No acute superimposed finding. Electronically Signed    By: Monte Fantasia M.D.   On: 08/19/2017 14:17   Mr Jeri Cos QI Contrast  Result Date: 08/18/2017 CLINICAL DATA:  History of uterine carcinoma and lung carcinoma with metastases. No neurologic symptoms are reported. EXAM: MRI HEAD WITHOUT AND WITH CONTRAST TECHNIQUE: Multiplanar, multiecho pulse sequences of the brain and surrounding structures were obtained without and with intravenous contrast. CONTRAST:  24mL MULTIHANCE GADOBENATE DIMEGLUMINE 529 MG/ML IV SOLN COMPARISON:  PET scan 08/08/2017. CT head 05/17/2016. FINDINGS: Brain: No evidence for acute infarction, hemorrhage, mass lesion, hydrocephalus, or extra-axial fluid. Generalized atrophy. Chronic microvascular ischemic change of a mild-to-moderate nature. Post infusion, no abnormal enhancement of the brain or meninges. Vascular: Dolichoectasia.  Visible vessels are patent. Skull and upper cervical spine: No visible calvarial or skull base lesion. Cervical spondylosis. Mild pannus surrounds the odontoid. Sinuses/Orbits: Non worrisome fluid within the RIGHT posterior ethmoid air cell. Other: None. IMPRESSION: Atrophy and chronic microvascular ischemic change. No evidence for intracranial metastatic disease. Electronically Signed   By: Staci Righter M.D.   On: 08/18/2017 14:35   Nm Pet Image Restag (ps) Skull Base To Thigh  Result Date: 08/08/2017 CLINICAL DATA:  Initial treatment strategy for lung mass with metastases. Patient has a history of endometrial cancer. EXAM: NUCLEAR MEDICINE PET SKULL BASE TO THIGH TECHNIQUE: 6.1 mCi F-18 FDG was injected intravenously. Full-ring PET imaging was performed from the skull base to thigh after the radiotracer. CT data was obtained and used for attenuation correction and anatomic localization. Fasting blood glucose: 76 mg/dl COMPARISON:  Chest CT 07/30/2017 FINDINGS: Mediastinal blood pool activity: SUV max 1.6 NECK: No hypermetabolic lymph nodes in the neck. 2.5 cm right thyroid nodule is hypermetabolic with  SUV max = 9.6. Incidental CT findings: none CHEST: The dominant anterior left hilar mass is markedly hypermetabolic with SUV max = 34.7. Hypermetabolic mediastinal and bilateral hilar lymphadenopathy is associated. 15 mm nodule posterior left lung (image 75/series 3) demonstrates SUV max =  6.3. Other bilateral pulmonary nodules are hypermetabolic. Hypermetabolic pleural lesions are identified more on the right than the left. There is diffuse pleural hypermetabolism posteriorly in the right costophrenic sulcus. Incidental CT findings: Heart is enlarged. Coronary artery calcification is evident. Atherosclerotic calcification is noted in the wall of the thoracic aorta. Small left pleural effusion associated with subpulmonic small right pleural effusion. ABDOMEN/PELVIS: Hypermetabolic liver lesions are identified including 3.5 cm posterior left liver lesion (130/3) with SUV max = 11.4. Hypermetabolic retroperitoneal lymphadenopathy is evident in the upper abdomen. 13 mm short axis lymph node posterior to the right renal artery demonstrates SUV max = 8.4 (128/3). Bilateral adrenal thickening is evident although no definite adrenal hypermetabolism. 15 mm short axis left para-aortic lymph node (135/3) has SUV max = 7.5. 13 mm right common iliac lymph node (156/3) demonstrates SUV max = 8.1. Hypermetabolic lymphadenopathy evident along the right pelvic sidewall. Subtle 2.5 cm lesion in the medial cortex of the interpolar right kidney is hypermetabolic with SUV max = 69.6. Incidental CT findings: Mild to moderate right hydroureteronephrosis is evident. Right ureter is distended down to the level of the right pelvic sidewall although etiology for the urinary obstruction is not evident. The uterus appears enlarged and heterogeneous but shows no hypermetabolic FDG accumulation. Layering tiny gallstones evident. Small volume intraperitoneal free fluid noted in the abdomen and pelvis with diffuse mesenteric and body wall edema.  Mild circumferential bladder wall thickening is evident. Densely calcified structures in the central mesentery may represent lymph nodes. SKELETON: No focal hypermetabolic activity to suggest skeletal metastasis. Incidental CT findings: Bones are diffusely demineralized. IMPRESSION: 1. Diffuse hypermetabolic metastatic disease in the chest, abdomen, and pelvis. There is a large left hilar mass that may represent primary bronchogenic neoplasm. 2. Bilateral hypermetabolic pulmonary nodules with relatively diffuse hypermetabolic right pleural disease. 3. Large hypermetabolic liver metastases with hypermetabolic lymphadenopathy in the upper abdomen and retroperitoneal space extending down into the right pelvic sidewall. 4. Hypermetabolic 2.4 cm lesion in the medial left kidney. This may represent a metastatic deposit or potentially primary renal neoplasm. 5. Mild to moderate right hydroureteronephrosis down to the level of the right pelvic sidewall. Distal right ureter extending into the bladder is nondilated. As such, etiology of this obstruction is probably metastatic disease along the right pelvic sidewall. 6. Hypermetabolic right thyroid lesion. 7. Uterus appears enlarged and heterogeneous, but demonstrates no hypermetabolic activity. 8.  Aortic Atherosclerois (ICD10-170.0) 9. Small umbilical hernia contains small volume fluid. This is associated with small volume intraperitoneal free fluid. Electronically Signed   By: Misty Stanley M.D.   On: 08/08/2017 12:04   US Biopsy (liver)  Result Date: 08/29/2017 CLINICAL DATA:  Metastatic malignancy including multiple liver lesions. EXAM: ULTRASOUND GUIDED CORE BIOPSY OF LIVER MEDICATIONS: 12.5 mcg IV Fentanyl Total Moderate Sedation Time: 15 minutes. The patient's level of consciousness and physiologic status were continuously monitored during the procedure by Radiology nursing. PROCEDURE: The procedure, risks, benefits, and alternatives were explained to the patient  and her family. Questions regarding the procedure were encouraged and answered. The patient's daughter understands and consents to the procedure. A time out was performed prior to initiating the procedure. The abdominal wall was prepped with chlorhexidine in a sterile fashion, and a sterile drape was applied covering the operative field. A sterile gown and sterile gloves were used for the procedure. Local anesthesia was provided with 1% Lidocaine. A 17 gauge needle was advanced into a lesion within the left lobe of the liver. After confirming needle  tip position, coaxial 18 gauge core biopsy samples were obtained. Three samples were obtained and submitted in formalin. Gel-Foam pledgets were advanced through the outer needle as the needle was retracted. COMPLICATIONS: None. FINDINGS: A lesion within the superficial left lobe measures approximately 4 cm in maximum diameter. Solid tissue was obtained. IMPRESSION: Ultrasound-guided biopsy performed of a 4 cm lesion within the left lobe of the liver. Electronically Signed   By: Aletta Edouard M.D.   On: 08/29/2017 12:09     Assessment and plan- Patient is a 84 y.o. female with metastatic endometrial carcinoma with mets to lung, liver, soft tissue and lymph nodes.  Discussed liver biopsy results with her family. She has metastatic endometrial cancer with extensive disease burden. Her performance status continues to decline. She is not a candidate for any systemic therapy. I would recommend focusing on her QOL and comfort. Hospice would be be the most reasonable option to pursue. Discussed benefits of hospivce. Home hospice versus hospice at nursing facility versus hospice home. Patients daughter [name] unable to care for her 24 hours. Her husband has been hospitalized and teresa is involved in his care. They would ideally like patient to go to hospice home. Her prognosis is likely in weeks <3 months. Family members are in agreement with hospice informational  meeting and we will make the referral.  Our office will be available to assist her for any needs that may arise in the future   Visit Diagnosis 1. Endometrial carcinoma ([place])   2. Liver metastases ([place])   3. Malignant neoplasm metastatic to lung, unspecified laterality ([place])   4. Goals of care, counseling/discussion      Dr. Randa Evens, MD, MPH [facility] [phone] 09/05/2017 9:34 PM

## 2017-09-06 ENCOUNTER — Telehealth: Payer: Self-pay | Admitting: *Deleted

## 2017-09-06 NOTE — Telephone Encounter (Signed)
Daughter called and states she is willing to go with hospice as discussed yesterday in office. She will use whichever hospice we choose for her to use. I asked do you want [facility] or someone else and she said [facility] is fine

## 2017-09-06 NOTE — Telephone Encounter (Signed)
Called daughter [name] and let her know that I needed to et dr. Janese Banks note to send the referrral to hospice. I did it today and sent it off with [name] and other sister Ms. Blackwell as point of contact. She was thankful and she does want [facility] [place]

## 2017-09-08 ENCOUNTER — Telehealth: Payer: Self-pay | Admitting: *Deleted

## 2017-09-08 NOTE — Telephone Encounter (Signed)
I sent referral to hospice already earlier in the week

## 2017-09-08 NOTE — Telephone Encounter (Signed)
Daughter called and states that patient in in a great deal of pain and is asking for something more for pain. She is not opened to hospice yet, family requested opening to services tomorrow. Please advise on pain management.

## 2017-09-08 NOTE — Telephone Encounter (Signed)
Daughter advised that dose of Oxycodone can be increased to 10 mg or 2 tabs of her current pain medicine. Sh estates she has enough to last until the hospice nurse comes tomorrow and can assess the patient.

## 2017-09-08 NOTE — Telephone Encounter (Signed)
Increase oxycodone 10mg  every 4 hrs.  Yes to hospice.

## 2017-09-23 DEATH — deceased

## 2017-10-23 DEATH — deceased
